# Patient Record
Sex: Female | Born: 1955 | Race: White | Hispanic: No | Marital: Married | Smoking: Never smoker
Health system: Southern US, Community
[De-identification: ages and names within clinical notes are randomized; demographics above are authoritative.]

## PROBLEM LIST (undated history)

## (undated) DIAGNOSIS — K589 Irritable bowel syndrome without diarrhea: Secondary | ICD-10-CM

## (undated) DIAGNOSIS — M329 Systemic lupus erythematosus, unspecified: Secondary | ICD-10-CM

## (undated) DIAGNOSIS — A4902 Methicillin resistant Staphylococcus aureus infection, unspecified site: Secondary | ICD-10-CM

## (undated) DIAGNOSIS — M1990 Unspecified osteoarthritis, unspecified site: Secondary | ICD-10-CM

## (undated) DIAGNOSIS — R7989 Other specified abnormal findings of blood chemistry: Secondary | ICD-10-CM

## (undated) DIAGNOSIS — K900 Celiac disease: Secondary | ICD-10-CM

## (undated) DIAGNOSIS — R32 Unspecified urinary incontinence: Secondary | ICD-10-CM

## (undated) DIAGNOSIS — I493 Ventricular premature depolarization: Secondary | ICD-10-CM

## (undated) DIAGNOSIS — I4891 Unspecified atrial fibrillation: Secondary | ICD-10-CM

## (undated) DIAGNOSIS — I272 Pulmonary hypertension, unspecified: Secondary | ICD-10-CM

## (undated) DIAGNOSIS — E079 Disorder of thyroid, unspecified: Secondary | ICD-10-CM

## (undated) DIAGNOSIS — D649 Anemia, unspecified: Secondary | ICD-10-CM

## (undated) DIAGNOSIS — R011 Cardiac murmur, unspecified: Secondary | ICD-10-CM

## (undated) DIAGNOSIS — H25013 Cortical age-related cataract, bilateral: Secondary | ICD-10-CM

## (undated) DIAGNOSIS — G459 Transient cerebral ischemic attack, unspecified: Secondary | ICD-10-CM

## (undated) DIAGNOSIS — I499 Cardiac arrhythmia, unspecified: Secondary | ICD-10-CM

## (undated) DIAGNOSIS — G4730 Sleep apnea, unspecified: Secondary | ICD-10-CM

## (undated) DIAGNOSIS — F419 Anxiety disorder, unspecified: Secondary | ICD-10-CM

## (undated) DIAGNOSIS — K219 Gastro-esophageal reflux disease without esophagitis: Secondary | ICD-10-CM

## (undated) DIAGNOSIS — K760 Fatty (change of) liver, not elsewhere classified: Secondary | ICD-10-CM

## (undated) DIAGNOSIS — I5030 Unspecified diastolic (congestive) heart failure: Secondary | ICD-10-CM

## (undated) DIAGNOSIS — G909 Disorder of the autonomic nervous system, unspecified: Secondary | ICD-10-CM

## (undated) DIAGNOSIS — H409 Unspecified glaucoma: Secondary | ICD-10-CM

## (undated) DIAGNOSIS — G4733 Obstructive sleep apnea (adult) (pediatric): Secondary | ICD-10-CM

## (undated) DIAGNOSIS — E039 Hypothyroidism, unspecified: Secondary | ICD-10-CM

## (undated) DIAGNOSIS — N946 Dysmenorrhea, unspecified: Secondary | ICD-10-CM

## (undated) HISTORY — DX: Disorder of thyroid, unspecified: E07.9

## (undated) HISTORY — DX: Unspecified diastolic (congestive) heart failure: I50.30

## (undated) HISTORY — DX: Obstructive sleep apnea (adult) (pediatric): G47.33

## (undated) HISTORY — DX: Anemia, unspecified: D64.9

## (undated) HISTORY — DX: Ventricular premature depolarization: I49.3

## (undated) HISTORY — DX: Disorder of the autonomic nervous system, unspecified: G90.9

## (undated) HISTORY — DX: Unspecified glaucoma: H40.9

## (undated) HISTORY — DX: Irritable bowel syndrome, unspecified: K58.9

## (undated) HISTORY — DX: Methicillin resistant Staphylococcus aureus infection, unspecified site: A49.02

## (undated) HISTORY — DX: Cortical age-related cataract, bilateral: H25.013

## (undated) HISTORY — DX: Celiac disease: K90.0

## (undated) HISTORY — DX: Unspecified urinary incontinence: R32

## (undated) HISTORY — DX: Sleep apnea, unspecified: G47.30

## (undated) HISTORY — PX: CHOLECYSTECTOMY: SHX55

## (undated) HISTORY — DX: Systemic lupus erythematosus, unspecified: M32.9

## (undated) HISTORY — DX: Fatty (change of) liver, not elsewhere classified: K76.0

## (undated) HISTORY — DX: Pulmonary hypertension, unspecified: I27.20

## (undated) HISTORY — DX: Other specified abnormal findings of blood chemistry: R79.89

## (undated) HISTORY — DX: Cardiac murmur, unspecified: R01.1

## (undated) HISTORY — DX: Anxiety disorder, unspecified: F41.9

## (undated) HISTORY — PX: TUBAL LIGATION: SHX77

## (undated) HISTORY — DX: Transient cerebral ischemic attack, unspecified: G45.9

## (undated) HISTORY — DX: Dysmenorrhea, unspecified: N94.6

## (undated) HISTORY — DX: Cardiac arrhythmia, unspecified: I49.9

---
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
## 1984-12-26 HISTORY — PX: LAPAROSCOPIC TUBAL LIGATION: SUR803

## 2014-05-26 HISTORY — PX: OTHER SURGICAL HISTORY: SHX169

## 2014-11-24 ENCOUNTER — Encounter: Payer: Self-pay | Admitting: Obstetrics and Gynecology

## 2014-11-24 ENCOUNTER — Ambulatory Visit (INDEPENDENT_AMBULATORY_CARE_PROVIDER_SITE_OTHER): Payer: BC Managed Care – PPO | Admitting: Obstetrics and Gynecology

## 2014-11-24 VITALS — BP 130/78 | HR 64 | Resp 16 | Ht 66.0 in | Wt 229.0 lb

## 2014-11-24 DIAGNOSIS — N921 Excessive and frequent menstruation with irregular cycle: Secondary | ICD-10-CM

## 2014-11-24 DIAGNOSIS — Z0000 Encounter for general adult medical examination without abnormal findings: Secondary | ICD-10-CM

## 2014-11-24 LAB — POCT URINALYSIS DIPSTICK
Bilirubin, UA: NEGATIVE
GLUCOSE UA: NEGATIVE
KETONES UA: NEGATIVE
Leukocytes, UA: NEGATIVE
Nitrite, UA: NEGATIVE
Protein, UA: NEGATIVE
UROBILINOGEN UA: NEGATIVE
pH, UA: 5

## 2014-11-24 NOTE — Progress Notes (Signed)
Patient ID: Maria Meza, female   DOB: 1956-12-13, 58 y.o.   MRN: [id number]   PCP:  Brock Ra, MD  58 y.o. [phone] MarriedCaucasianF here for problem visit. LMP 11/18/14.  Mirena IUD placed in 03/21/11.  Wants to know what to expect from menopause.   Having some shortness of breath just prior or just after menses, occuring for the last 2 - 3 years. Feels breathless.  Saw an endocrinologist and had no hormonal imbalance.  No hot flashes or night sweats.  Some left sided pain recently.  No change in bowel function.  Has IBS.   Was diagnosed with lupus in the mean time.  Saw neurology, pulmonary, and cardiology specialists.  Used to have difficult menses.   Menses have never stopped.  Has skipped 2 months at a time.  Had had heavy menses, and blood flow improved but never stopped.  Cramping controlled also.  Had an ultrasound 5 years ago at [facility] and it was normal.  Believes she an endometrial biopsy by Dr. Alton Revere in [place] 5 years ago, and this was normal also.   Used OCPs in past and this was discontinued due to TIAs.   Has a cardiac arrhythmia and takes Metroprolol.  Sees Dr. Vennie Homans cardiologist at [facility] for this.   Has well controlled hypothyroidism.   Patient's last menstrual period was 11/23/2014 (exact date).          Sexually active: Yes.  female  The current method of family planning is IUD--Mirena inserted 02/2011.   Exercising: No.  none. Smoker:  no  Health Maintenance: Pap:  06-25-14 DGL:OVFIEP of HPV testing History of abnormal Pap:  no MMG:  10-03-14 [id number]  [facility] in [place], [place].  Next colonoscopy due 03/2015. BMD:   never TDaP:  2012 Screening Labs:   Hb today: PCP, Urine today: 1+ RBC's--see LMP   reports that she has never smoked. She does not have any smokeless tobacco history on file. She reports that she does not drink alcohol or use illicit drugs.  Past Medical History  Diagnosis Date   . Anemia   . Anxiety   . Dysmenorrhea   . Heart murmur   . Thyroid disease     hypothyroidism  . Urinary incontinence     with sneezing, coughing  . Lupus   . Abnormal heart rate   . TIA (transient ischemic attack)     2 per patient    Past Surgical History  Procedure Laterality Date  . Tubal ligation    . Cholecystectomy      No current outpatient prescriptions on file.   No current facility-administered medications for this visit.    Family History  Problem Relation Age of Onset  . Heart attack Father     dec age 10  . Hypertension Father   . Rheum arthritis Maternal Grandmother   . Migraines Maternal Grandmother   . Cancer Maternal Grandfather 52    colon ca--DEC age 56  . Diabetes Maternal Grandfather   . Hypertension Mother   . Hyperlipidemia Mother   . Migraines Mother   . Seizures Mother     epilepsy  . Stroke Mother     TIA's  . Stroke Paternal Grandmother     multiple  . Thyroid disease Paternal Grandmother     goiter-hypothyroid  . Thyroid disease Sister     hypothyroid  . Thyroid disease Sister     hypothyroid    ROS:  Pertinent items are  noted in HPI.  Otherwise, a comprehensive ROS was negative.  Exam:       Height: 5' 6"  (167.6 cm)  Ht Readings from Last 3 Encounters:  11/24/14 5' 6"  (1.676 m)    General appearance: alert, cooperative and appears stated age Head: Normocephalic, without obvious abnormality, atraumatic Neck: no adenopathy, supple, symmetrical, trachea midline and thyroid normal to inspection and palpation Lungs: clear to auscultation bilaterally   Heart: regular rate and rhythm Abdomen: central obesity, soft, non-tender; bowel sounds normal; no masses,  no organomegaly Extremities: extremities normal, atraumatic, no cyanosis or edema Skin: Skin color, texture, turgor normal. No rashes or lesions Lymph nodes: Cervical, supraclavicular, and axillary nodes normal. No abnormal inguinal nodes palpated Neurologic: Grossly  normal  Pelvic: External genitalia:  no lesions              Urethra:  normal appearing urethra with no masses, tenderness or lesions              Bartholins and Skenes: normal                 Vagina: normal appearing vagina with normal color and discharge, no lesions              Cervix: no lesions and Menstrual mucousy flow noted and IUD strings present.               Pap taken: No. Bimanual Exam:  Uterus:  normal size, contour, position, consistency, mobility, non-tender              Adnexa: normal adnexa and no mass, fullness, tenderness               Rectovaginal: Confirms               Anus:  normal sphincter tone, no lesions  Assessment   Mirena IUD patient.  History of menorrhagia.  Perimenstrual shortness of breath.  Hypothyroidism.  History of TIAs.  Lupus.   Plan    Will check FSH and AMH. Return for pelvic ultrasound and potential endometrial biopsy to rule out pathology.   An After Visit Summary was printed and given to the patient.

## 2014-11-25 LAB — FOLLICLE STIMULATING HORMONE: FSH: 18 m[IU]/mL

## 2014-11-25 LAB — ESTRADIOL: Estradiol: 14.9 pg/mL

## 2014-11-26 ENCOUNTER — Telehealth: Payer: Self-pay | Admitting: Obstetrics and Gynecology

## 2014-11-26 NOTE — Telephone Encounter (Signed)
Patient returned call. Advised that per benefit quote received, she will not have any financial responsibility when she comes in for PUS. Patient agreeable. Scheduled PUS. Advised patient of 72 hour cancellation policy and $355 cancellation fee. Patient agreeable.

## 2014-11-26 NOTE — Telephone Encounter (Signed)
Left message for patient to call back. Need to go over benefits for and schedule PUS/EMB. Pr $0

## 2014-11-27 LAB — ANTI MULLERIAN HORMONE

## 2014-12-11 ENCOUNTER — Encounter: Payer: Self-pay | Admitting: Obstetrics and Gynecology

## 2014-12-11 ENCOUNTER — Ambulatory Visit (INDEPENDENT_AMBULATORY_CARE_PROVIDER_SITE_OTHER): Payer: BC Managed Care – PPO | Admitting: Obstetrics and Gynecology

## 2014-12-11 ENCOUNTER — Ambulatory Visit (INDEPENDENT_AMBULATORY_CARE_PROVIDER_SITE_OTHER): Payer: BC Managed Care – PPO

## 2014-12-11 DIAGNOSIS — N921 Excessive and frequent menstruation with irregular cycle: Secondary | ICD-10-CM

## 2014-12-11 NOTE — Progress Notes (Signed)
Subjective   Patient is here today for pelvic ultrasound and potential endometrial biopsy.  Has a Mirena IUD in place.  Can skip 2 months at a time.  When does have menses, it is less bleeding.  Changes a pad every 2 - 3 hours but pad is not saturated.  No clotting.  It was exceedingly heavy prior to Mirena IUD.  Last EMB was 5 years ago.  FSH 18, E2 14.9, and AMH < 0.03 on 11/24/14.  Objective  Pelvic ultrasound performed - Images and report reviewed with patient.   Uterus with IUD in canal.  EMS 9.31 mm.  Ovaries normal. No free fluid.     Endometrial biopsy  Consent for procedure. Speculum placed.  Prep of cervix with Hibiclens.  IUD string noted.  Pipelle placed once to 7 cm and tissue obtained and sent to pathology.   Assessment  Menorrhagia with irregular menses.  Mirena IUD patient.  [place] premenopausal.   Plan  Follow up EMB. Instructions given.  OK to continue with Mirena IUD.   15 minutes face to face time of which over 50% was spent in counseling.   After visit summary to patient.

## 2014-12-11 NOTE — Patient Instructions (Signed)
Endometrial Biopsy, Care After Refer to this sheet in the next few weeks. These instructions provide you with information on caring for yourself after your procedure. Your health care provider may also give you more specific instructions. Your treatment has been planned according to current medical practices, but problems sometimes occur. Call your health care provider if you have any problems or questions after your procedure. WHAT TO EXPECT AFTER THE PROCEDURE After your procedure, it is typical to have the following:  You may have mild cramping and a small amount of vaginal bleeding for a few days after the procedure. This is normal. HOME CARE INSTRUCTIONS  Only take over-the-counter or prescription medicine as directed by your health care provider.  Do not douche, use tampons, or have sexual intercourse until your health care provider approves.  Follow your health care provider's instructions regarding any activity restrictions, such as strenuous exercise or heavy lifting. SEEK MEDICAL CARE IF:  You have heavy bleeding or bleeding longer than 2 days after the procedure.  You have bad smelling drainage from your vagina.  You have a fever and chills.  Youhave severe lower stomach (abdominal) pain. SEEK IMMEDIATE MEDICAL CARE IF:  You have severe cramps in your stomach or back.  You pass large blood clots.  Your bleeding increases.  You become weak or lightheaded, or you pass out. Document Released: 10/02/2013 Document Reviewed: 10/02/2013 ExitCare Patient Information 2015 [employer]. This information is not intended to replace advice given to you by your health care provider. Make sure you discuss any questions you have with your health care provider.  

## 2014-12-15 LAB — IPS OTHER TISSUE BIOPSY

## 2014-12-16 ENCOUNTER — Telehealth: Payer: Self-pay

## 2014-12-16 NOTE — Telephone Encounter (Signed)
Spoke with patient. Advised patient of message and results as seen below from Maria Meza. Patient is agreeable and verbalizes understanding.   Routing to provider for final review. Patient agreeable to disposition. Will close encounter

## 2014-12-16 NOTE — Telephone Encounter (Signed)
-----   Message from [place], MD sent at 12/16/2014 11:30 AM EST ----- Please let patient know that her endometrial biopsy was negative and normal! No further evaluation is needed at this time.

## 2015-03-23 ENCOUNTER — Encounter: Payer: Self-pay | Admitting: Obstetrics and Gynecology

## 2015-03-23 ENCOUNTER — Ambulatory Visit (INDEPENDENT_AMBULATORY_CARE_PROVIDER_SITE_OTHER): Payer: BLUE CROSS/BLUE SHIELD | Admitting: Obstetrics and Gynecology

## 2015-03-23 VITALS — BP 120/70 | HR 70 | Resp 14 | Ht 65.5 in | Wt 209.8 lb

## 2015-03-23 DIAGNOSIS — N951 Menopausal and female climacteric states: Secondary | ICD-10-CM | POA: Diagnosis not present

## 2015-03-23 DIAGNOSIS — Z0000 Encounter for general adult medical examination without abnormal findings: Secondary | ICD-10-CM

## 2015-03-23 DIAGNOSIS — Z01419 Encounter for gynecological examination (general) (routine) without abnormal findings: Secondary | ICD-10-CM

## 2015-03-23 LAB — POCT URINALYSIS DIPSTICK
Leukocytes, UA: NEGATIVE
UROBILINOGEN UA: NEGATIVE
pH, UA: 5

## 2015-03-23 NOTE — Patient Instructions (Signed)

## 2015-03-23 NOTE — Progress Notes (Signed)
59 y.o. [id number] MarriedCaucasianF here for annual exam.   Mirena IUD placed 02/2011. No menses Jan or Feb 2016.  LMP was March 17, 2015.  Still spotting today.  No heavy menses or clotting.  Has had menses almost every month for the last year.  Menses are definitely better since the IUD was placed.  Infrequent hot flashes.   Patient is having anemia and palpitations.  Some shortness of breath and dizziness. PCP is Dr. Lonia Mad in [place].  Having ferritin infusions. Has also seen cardiologist. Did Holter monitor.   Has Lupus and hypothyroidism.  TFTs normal per patient.  History of TIA.  Had a UTI in January 2016 when in hospital for palpitations.  No symptoms and no test of cure to date.   Some leak of urine if cough or sneeze.  Does Kegels on toilet.   FSH 18, E2 14.9, and AMH < 0.03 on 11/24/14. Normal pelvic ultrasound last year.   No LMP recorded.          Sexually active: Yes.    The current method of family planning is IUD.    Exercising: No.  The patient does not participate in regular exercise at present. Smoker:  no  Health Maintenance: Pap:  06/2014 Normal- per pt History of abnormal Pap:  no MMG:  09/2014 [facility] Colonoscopy:  03/2005 Normal - due 03/2015.  Did in [place].  BMD:   Never  TDaP:  2012  Screening Labs: PCP, Hb today: PCP, Urine today: Trace RBC;s    reports that she has never smoked. She does not have any smokeless tobacco history on file. She reports that she does not drink alcohol or use illicit drugs.  Past Medical History  Diagnosis Date  . Anemia   . Anxiety   . Dysmenorrhea   . Heart murmur   . Thyroid disease     hypothyroidism  . Urinary incontinence     with sneezing, coughing  . Lupus   . TIA (transient ischemic attack)     2 per patient  . Arrhythmia     Past Surgical History  Procedure Laterality Date  . Tubal ligation    . Cholecystectomy      Current Outpatient Prescriptions   Medication Sig Dispense Refill  . albuterol (PROAIR HFA) 108 (90 BASE) MCG/ACT inhaler Inhale 1 puff into the lungs as needed.    Marland Kitchen aspirin EC 81 MG tablet Take 81 mg by mouth daily.    Marland Kitchen b complex vitamins capsule Take 1 capsule by mouth 2 (two) times daily.    . folic acid (FOLVITE) 1 MG tablet Take 1 mg by mouth. 2 tablets daily    . hydroxychloroquine (PLAQUENIL) 200 MG tablet Take 200 mg by mouth daily.    . magnesium oxide (MAG-OX) 400 MG tablet Take 1 tablet by mouth 2 (two) times daily.    . metoprolol (LOPRESSOR) 50 MG tablet Take 50 mg by mouth daily.    . Vitamin D, Ergocalciferol, (DRISDOL) 50000 UNITS CAPS capsule Take 50,000 Units by mouth once a week.     No current facility-administered medications for this visit.    Family History  Problem Relation Age of Onset  . Heart attack Father     dec age 66  . Hypertension Father   . Rheum arthritis Maternal Grandmother   . Migraines Maternal Grandmother   . Cancer Maternal Grandfather 45    colon ca--DEC age 78  . Diabetes Maternal Grandfather   .  Hypertension Mother   . Hyperlipidemia Mother   . Migraines Mother   . Seizures Mother     epilepsy  . Stroke Mother     TIA's  . Stroke Paternal Grandmother     multiple  . Thyroid disease Paternal Grandmother     goiter-hypothyroid  . Thyroid disease Sister     hypothyroid  . Thyroid disease Sister     hypothyroid    ROS:  Pertinent items are noted in HPI.  Otherwise, a comprehensive ROS was negative.  Exam:   There were no vitals taken for this visit.         Ht Readings from Last 3 Encounters:  12/11/14 5' 6"  (1.676 m)  11/24/14 5' 6"  (1.676 m)    General appearance: alert, cooperative and appears stated age Head: Normocephalic, without obvious abnormality, atraumatic Neck: no adenopathy, supple, symmetrical, trachea midline and thyroid normal to inspection and palpation Lungs: clear to auscultation bilaterally Breasts: normal appearance, no masses or  tenderness, Inspection negative, No nipple discharge or bleeding, No axillary or supraclavicular adenopathy, Right nipple normal.  Left nipple inverted (old change per patient.) Heart: regular rate and rhythm Abdomen: soft, non-tender; bowel sounds normal; no masses,  no organomegaly Extremities: extremities normal, atraumatic, no cyanosis or edema Skin: Skin color, texture, turgor normal. No rashes or lesions Lymph nodes: Cervical, supraclavicular, and axillary nodes normal. No abnormal inguinal nodes palpated Neurologic: Grossly normal   Pelvic: External genitalia:  no lesions              Urethra:  normal appearing urethra with no masses, tenderness or lesions              Bartholins and Skenes: normal                 Vagina: normal appearing vagina with normal color and discharge, no lesions              Cervix: no lesions and IUD strings seen.  Bloody mucous noted.              Pap taken: No. Bimanual Exam:  Uterus:  normal size, contour, position, consistency, mobility, non-tender              Adnexa: normal adnexa and no mass, fullness, tenderness               Rectovaginal: Confirms               Anus:  normal sphincter tone, no lesions  Chaperone was present for exam.  A:  Well Woman with normal exam Mirena IUD patient.  Anemia of undetermined etiology.  Palpitations under work up with cardiologist.  Mild stress incontinence.   P:   Mammogram yearly.  pap smear not indicated.  Check FSH.  Colonoscopy recommended.  She will do this in [place].  Kegels reviewed.  Briefly mentioned PT and surgery if GSI persists or worsens.   return annually or prn

## 2015-03-24 LAB — URINALYSIS, MICROSCOPIC ONLY
Bacteria, UA: NONE SEEN
CASTS: NONE SEEN
Crystals: NONE SEEN
Squamous Epithelial / LPF: NONE SEEN

## 2015-03-24 LAB — FOLLICLE STIMULATING HORMONE: FSH: 28.1 m[IU]/mL

## 2015-03-25 LAB — URINE CULTURE
COLONY COUNT: NO GROWTH
ORGANISM ID, BACTERIA: NO GROWTH

## 2015-11-17 ENCOUNTER — Telehealth: Payer: Self-pay | Admitting: Emergency Medicine

## 2015-11-17 ENCOUNTER — Encounter: Payer: Self-pay | Admitting: Obstetrics and Gynecology

## 2015-11-17 DIAGNOSIS — Z30432 Encounter for removal of intrauterine contraceptive device: Secondary | ICD-10-CM

## 2015-11-17 NOTE — Telephone Encounter (Signed)
Ok to remove the IUD really at any time.  If I have approval for removal at the time of her annual exam, I am happy to do so then.

## 2015-11-17 NOTE — Telephone Encounter (Signed)
Dr. Quincy Simmonds,   Patient sent mychart message with request for information about IUD removal.  [place] on 03/23/15 was 28.1.   Okay to schedule appointment for IUD removal in February/Early March?

## 2015-11-17 NOTE — Telephone Encounter (Signed)
Chief Complaint  Patient presents with  . Advice Only    Mychart message     ===View-only below this line===   ----- Message -----    FromRushie Nyhan    Sent: 11/17/2015  9:39 AM EST      To: Arloa Koh, MD Subject: Non-Urgent Medical Question  I have to have my IUD removed.  It will be 5 years on March 26th, 2017.  I have my upcoming physical on April 5th, 2017 at 11:00 with Dr. Quincy Simmonds.  Will she be able to remove it on that date, or do I need to make another appointment to have that done?  Are there any side effects in removing an IUD.  I have a Morena.  Thank you, Neoma Laming

## 2015-11-23 NOTE — Telephone Encounter (Signed)
Spoke with patient. She is agreeable to IUD removal at time of annual exam. Order placed. Patient does not think she will have any insurance changes.   Patient states she has had a cycle on 04/17/15 and then 10/04/15. Advised will let Dr. Quincy Simmonds know and return call if any changes to plan. Patient agreeable.

## 2015-11-23 NOTE — Telephone Encounter (Signed)
Please let me know if she has any further menstrual cycles.  If she does, I would like to have her return for a recheck.

## 2015-11-24 NOTE — Telephone Encounter (Signed)
Call to patient and she is given message from Dr. Quincy Simmonds. She verbalizes understanding of recommendations and instructions and will call with any future bleeding. Routing to provider for final review. Patient agreeable to disposition. Will close encounter.

## 2015-12-27 DIAGNOSIS — M359 Systemic involvement of connective tissue, unspecified: Secondary | ICD-10-CM

## 2015-12-27 HISTORY — DX: Systemic involvement of connective tissue, unspecified: M35.9

## 2015-12-29 ENCOUNTER — Encounter: Payer: Self-pay | Admitting: Obstetrics and Gynecology

## 2015-12-29 ENCOUNTER — Telehealth: Payer: Self-pay | Admitting: Emergency Medicine

## 2015-12-29 DIAGNOSIS — N938 Other specified abnormal uterine and vaginal bleeding: Secondary | ICD-10-CM

## 2015-12-29 NOTE — Telephone Encounter (Signed)
Patient will need blood work done and a potential IUD removal, sonohysterogram and endometrial biopsy.  I see she has an appointment on a Wednesday.  Is it possible to have her come to the office on Thursday next week instead? If not, we will start with her appointment on Wednesday and schedule a secondary appointment if needed.

## 2015-12-29 NOTE — Telephone Encounter (Signed)
Call to patient for clinical triage.  Office visit scheduled per patient request for 01/06/16 with Dr. Quincy Simmonds.  Will close mychart encounter.

## 2015-12-29 NOTE — Telephone Encounter (Signed)
Call to patient. She confirms message below. States she has been having vaginal bleeding, more than spotting since 12/23/15. She reports that she had cycles 04/17/15 and 10/17/15.  She is advised that after last episode of bleeding, message from Dr. Quincy Simmonds was to have patient follow up in office for evaluation. Patient is agreeable to this. She requests to be seen next week since she lives in [place] and has upcoming cardiology appointments.  Advised patient will request Dr. Quincy Simmonds review and may plan for procedure and if so will return her call to give instructions.  Patient agreeable. She is advised to call back with any heavy bleeding or concerning symptoms.  I advised patient to keep calendar of menses, call back if bleeding continues or is soaking pads or if any abdominal pain or cramping. She verbalizes understanding of symptoms of bleeding emergencies and when to call back to office or go to urgent care or emergency room as needed. Patient is agreeable and verbalizes understanding of plan and will call back if condition changes or worsens. Routing to Dr. Quincy Simmonds for review and orders if necessary.

## 2015-12-29 NOTE — Telephone Encounter (Signed)
Chief Complaint  Patient presents with  . Advice Only    Patient sent mychart message.     ===View-only below this line===   ----- Message -----    FromRushie Nyhan    Sent: 12/29/2015  1:22 PM EST      To: Arloa Koh, MD Subject: Non-Urgent Medical Question  Hi Dr. Quincy Simmonds, I had sent a message a month or so ago that I had not had a period since April 17, 2015, but had one on October 22nd.  You had advised if this happened again to let you know.  I have started my cycle again on December 28th and am still on it.  I don't have a lot of bleeding but enough to wear a pad.  I know I am scheduled in April to have my Morena removed and I just want to make sure that everything is ok.  Just let me know what you advise.  Thank you and Happy New Year to you and your staff  Maria Meza

## 2015-12-30 NOTE — Telephone Encounter (Signed)
Patient returned call and she is given message from Dr. Quincy Simmonds.  She requests to keep appointment with Dr. Quincy Simmonds as scheduled and will return for follow up as necessary.  Update to Dr. Quincy Simmonds.

## 2015-12-30 NOTE — Telephone Encounter (Signed)
Message left to return call to Arbor Leer at [phone].    

## 2016-01-06 ENCOUNTER — Ambulatory Visit (INDEPENDENT_AMBULATORY_CARE_PROVIDER_SITE_OTHER): Payer: BLUE CROSS/BLUE SHIELD | Admitting: Obstetrics and Gynecology

## 2016-01-06 ENCOUNTER — Encounter: Payer: Self-pay | Admitting: Obstetrics and Gynecology

## 2016-01-06 VITALS — BP 110/64 | HR 60 | Ht 65.5 in | Wt 223.0 lb

## 2016-01-06 DIAGNOSIS — N926 Irregular menstruation, unspecified: Secondary | ICD-10-CM

## 2016-01-06 NOTE — Progress Notes (Signed)
Patient ID: Maria Meza, female   DOB: [birth year], 60 y.o.   MRN: [id number] GYNECOLOGY  VISIT   HPI: 60 y.o.   Married  Caucasian  female   G2P2002 with Patient's last menstrual period was 12/23/2015 (approximate).   here for vaginal bleeding. Patient states had a cycle 12/2014, 03/2015, 09/2015 and began a cycle 12-23-15 and is still have some slight bleeding with lower back discomfort.  Feeling crampy.  No clotting.   [place] 28.1 on 03/23/15.  FSH 18, E2 14.9, and AMH < 0.03 on 11/24/14.  Pelvic ultrasound performed 12/11/14: Uterus with IUD in canal.  EMS 9.31 mm.  Ovaries normal. No free fluid.   Had a benign EMB the same day.   Mirena IUD due for removal in March 2017. It was placed 5 years ago to control heavy menses.  GYNECOLOGIC HISTORY: Patient's last menstrual period was 12/23/2015 (approximate). Contraception:Mirena IUD inserted 02/2011 Menopausal hormone therapy: none Last mammogram: 09/2015 normal per patient in [place], [place] Last pap smear: 06/2014 normal per patient        OB History    Gravida Para Term Preterm AB TAB SAB Ectopic Multiple Living   2 2 2       2          There are no active problems to display for this patient.   Past Medical History  Diagnosis Date  . Anemia   . Anxiety   . Dysmenorrhea   . Heart murmur   . Thyroid disease     hypothyroidism  . Urinary incontinence     with sneezing, coughing  . Lupus ([place])   . TIA (transient ischemic attack)     2 per patient  . Arrhythmia   . Sleep apnea     Past Surgical History  Procedure Laterality Date  . Tubal ligation    . Cholecystectomy      Current Outpatient Prescriptions  Medication Sig Dispense Refill  . albuterol (PROAIR HFA) 108 (90 BASE) MCG/ACT inhaler Inhale 1 puff into the lungs as needed.    Marland Kitchen b complex vitamins capsule Take 1 capsule by mouth 2 (two) times daily.    . folic acid (FOLVITE) 1 MG tablet Take 1 mg by mouth. 2 tablets daily    . hydroxychloroquine  (PLAQUENIL) 200 MG tablet Take 200 mg by mouth daily.    . Iron-Vitamin C 65-125 MG TABS Take by mouth.    . latanoprost (XALATAN) 0.005 % ophthalmic solution Apply 1 drop to eye daily.    Marland Kitchen levothyroxine (SYNTHROID, LEVOTHROID) 137 MCG tablet Take 137 mcg by mouth daily before breakfast.    . magnesium oxide (MAG-OX) 400 MG tablet Take 1 tablet by mouth daily.     . metoprolol tartrate (LOPRESSOR) 25 MG tablet Take 25 mg by mouth 2 (two) times daily.    Marland Kitchen UNABLE TO FIND Compound Iron Infusion Every quarter    . Vitamin D, Ergocalciferol, (DRISDOL) 50000 UNITS CAPS capsule Take 50,000 Units by mouth once a week.     No current facility-administered medications for this visit.     ALLERGIES: Hydrocodone and Pacerone  Family History  Problem Relation Age of Onset  . Heart attack Father     dec age 7  . Hypertension Father   . Rheum arthritis Maternal Grandmother   . Migraines Maternal Grandmother   . Cancer Maternal Grandfather 10    colon ca--DEC age 29  . Diabetes Maternal Grandfather   . Hypertension Mother   .  Hyperlipidemia Mother   . Migraines Mother   . Seizures Mother     epilepsy  . Stroke Mother     TIA's  . Stroke Paternal Grandmother     multiple  . Thyroid disease Paternal Grandmother     goiter-hypothyroid  . Thyroid disease Sister     hypothyroid  . Thyroid disease Sister     hypothyroid    Social History   Social History  . Marital Status: Married    Spouse Name: N/A  . Number of Children: N/A  . Years of Education: N/A   Occupational History  . Not on file.   Social History Main Topics  . Smoking status: Never Smoker   . Smokeless tobacco: Not on file  . Alcohol Use: No  . Drug Use: No  . Sexual Activity:    Partners: Male    Birth Control/ Protection: IUD     Comment: Mirena inserted 02/2011   Other Topics Concern  . Not on file   Social History Narrative    ROS:  Pertinent items are noted in HPI.  PHYSICAL EXAMINATION:    BP  110/64 mmHg  Pulse 60  Ht 5' 5.5" (1.664 m)  Wt 223 lb (101.152 kg)  BMI 36.53 kg/m2  LMP 12/23/2015 (Approximate)    General appearance: alert, cooperative and appears stated age  Pelvic: External genitalia:  no lesions              Urethra:  normal appearing urethra with no masses, tenderness or lesions              Bartholins and Skenes: normal                 Vagina: normal appearing vagina with normal color and discharge, no lesions              Cervix: no lesions.  IUD strings seen.  Tan tinged mucous at external os.        Bimanual Exam:  Uterus:  normal size, contour, position, consistency, mobility, non-tender              Adnexa: normal adnexa and no mass, fullness, tenderness               Chaperone was present for exam.  ASSESSMENT  Irregular menses. Perimenopausal female.  Mirena IUD patient.  Almost due for removal.   PLAN  Counseled regarding perimenopausal menses versus postmenopausal bleeding.  Will check FSH and estradiol today.  If labs indicate menopause, will proceed with removal of the IUD and evaluation of bleeding with sonohysterogram and EMB. We did review Mirena IUD and its ability to reduce risk of endometrial hyperplasia and malignancy in addition to treating heavy menses and providing contraception.  Patient assured that her risk of pregnancy is not of a concern at this time.    An After Visit Summary was printed and given to the patient.  __15____ minutes face to face time of which over 50% was spent in counseling.

## 2016-01-07 ENCOUNTER — Other Ambulatory Visit: Payer: Self-pay | Admitting: Obstetrics and Gynecology

## 2016-01-07 DIAGNOSIS — Z30432 Encounter for removal of intrauterine contraceptive device: Secondary | ICD-10-CM

## 2016-01-07 LAB — FOLLICLE STIMULATING HORMONE: FSH: 34.4 m[IU]/mL

## 2016-01-07 LAB — ESTRADIOL: ESTRADIOL: 15.2 pg/mL

## 2016-01-08 ENCOUNTER — Encounter: Payer: Self-pay | Admitting: Obstetrics and Gynecology

## 2016-01-08 ENCOUNTER — Telehealth: Payer: Self-pay

## 2016-01-08 NOTE — Telephone Encounter (Signed)
Non-Urgent Medical Question  Message [phone]   From  Tavionna Grout   To  Nunzio Cobbs, MD   Sent  01/08/2016 10:19 AM     Good Morning,   I have an upcoming appointment for a physical on April 5th. I was in the office on Jan 11th for an evaluation. Dr. Quincy Simmonds said that we would remove the [facility] IUD in March, (I think it is around March 26th that the five year period will expire) Could you possibly check with her to see if she will do the removal on the April 5th appt date or would I need to come in March since the April appt is for my physical?   Thank you, I appreciate your help.   [name]      Responsible Party    Pool - [facility] Clinical Pool No one has taken responsibility for this message.     No actions have been taken on this message.     Routing to [place] for review and advise. Okay to remove patient's IUD at her aex appointment on 03/30/2016?

## 2016-01-08 NOTE — Telephone Encounter (Signed)
Spoke with patient. Advised of message as seen below from [place]. Patient is agreeable.  Routing to provider for final review. Patient agreeable to disposition. Will close encounter.

## 2016-01-08 NOTE — Telephone Encounter (Signed)
Telephone encounter created to discuss mychart message with Dr.Silva. 

## 2016-01-08 NOTE — Telephone Encounter (Signed)
It is Ok to remove the Mirena at her annual exam appointment in April.  I sent this through to precert already.   Thanks.

## 2016-01-12 ENCOUNTER — Telehealth: Payer: Self-pay | Admitting: Obstetrics and Gynecology

## 2016-01-12 NOTE — Telephone Encounter (Signed)
Called patient to discuss benefits for a procedure. Left Voicemail requesting a call.

## 2016-01-13 NOTE — Telephone Encounter (Signed)
Spoke with pt regarding benefit for IUD removal. Patient understood and agreeable. Patient states have this done on her 03/30/16 appointment with Dr. Quincy Simmonds. Pt aware of arrival date and time. No further questions. Ok to close

## 2016-01-22 ENCOUNTER — Telehealth: Payer: Self-pay | Admitting: Obstetrics and Gynecology

## 2016-01-22 NOTE — Telephone Encounter (Signed)
Spoke with patient. Advised patient we will still be able to remove her IUD at her Aex appointment as previously approved by Dr.Silva (see 01/08/2016 telephone note). I have added this to her aex appointment which is now on 04/06/2016. Patient verbalizes understanding.  Routing to provider for final review. Patient agreeable to disposition. Will close encounter.

## 2016-01-22 NOTE — Telephone Encounter (Signed)
Patient's appointment with Dr Quincy Simmonds for 03/30/16 iud removal has been cancelled because she will not be in the office. Aex was rescheduled but not the IUD removal.

## 2016-03-10 ENCOUNTER — Encounter: Payer: Self-pay | Admitting: Obstetrics and Gynecology

## 2016-03-14 ENCOUNTER — Encounter: Payer: Self-pay | Admitting: Obstetrics and Gynecology

## 2016-03-14 ENCOUNTER — Ambulatory Visit (INDEPENDENT_AMBULATORY_CARE_PROVIDER_SITE_OTHER): Payer: BLUE CROSS/BLUE SHIELD | Admitting: Obstetrics and Gynecology

## 2016-03-14 VITALS — BP 114/70 | HR 64 | Ht 65.5 in | Wt 232.0 lb

## 2016-03-14 DIAGNOSIS — N63 Unspecified lump in breast: Secondary | ICD-10-CM

## 2016-03-14 DIAGNOSIS — N6310 Unspecified lump in the right breast, unspecified quadrant: Secondary | ICD-10-CM

## 2016-03-14 DIAGNOSIS — L739 Follicular disorder, unspecified: Secondary | ICD-10-CM

## 2016-03-14 MED ORDER — CEPHALEXIN 500 MG PO CAPS: 500.0000 mg | ORAL_CAPSULE | Freq: Four times a day (QID) | ORAL | Status: DC

## 2016-03-14 MED ORDER — FLUCONAZOLE 150 MG PO TABS: 150.0000 mg | ORAL_TABLET | Freq: Once | ORAL | Status: DC

## 2016-03-14 NOTE — Progress Notes (Signed)
Scheduled patient while in office for right breast diagnostic mammogram with ultrasound at the [facility] on 03/21/2016 at 1:50 pm. Placed in mammogram hold.

## 2016-03-14 NOTE — Progress Notes (Signed)
Patient ID: Maria Meza, female   DOB: 05-28-1956, 60 y.o.   MRN: [id number] GYNECOLOGY  VISIT   HPI: 60 y.o.   Married  Caucasian  female   G2P2002 with Patient's last menstrual period was 12/27/2015 (approximate).   here for lump in right breast.    First noticed lump one week ago.  No pain of discomfort.   Has hx of boils under her arm.  When she has her lupus flares, these get worse.  Annual exam and IUD removal on April 12.   Maternal aunt with breast cancer.   GYNECOLOGIC HISTORY: Patient's last menstrual period was 12/27/2015 (approximate). Contraception:Mirena 02/2011 Menopausal hormone therapy: none Last mammogram: 09/2015 normal per patient in [place], [place].  Did not her so assumes that no news is good news. Last pap smear: 06/2014 normal per patient        OB History    Gravida Para Term Preterm AB TAB SAB Ectopic Multiple Living   2 2 2       2          There are no active problems to display for this patient.   Past Medical History  Diagnosis Date  . Anemia   . Anxiety   . Dysmenorrhea   . Heart murmur   . Thyroid disease     hypothyroidism  . Urinary incontinence     with sneezing, coughing  . Lupus ([place])   . TIA (transient ischemic attack)     2 per patient  . Arrhythmia   . Sleep apnea     Past Surgical History  Procedure Laterality Date  . Tubal ligation    . Cholecystectomy      Current Outpatient Prescriptions  Medication Sig Dispense Refill  . albuterol (PROAIR HFA) 108 (90 BASE) MCG/ACT inhaler Inhale 1 puff into the lungs as needed.    Marland Kitchen b complex vitamins capsule Take 1 capsule by mouth 2 (two) times daily.    . folic acid (FOLVITE) 1 MG tablet Take 1 mg by mouth. 2 tablets daily    . hydroxychloroquine (PLAQUENIL) 200 MG tablet Take 200 mg by mouth daily.    . Iron-Vitamin C 65-125 MG TABS Take by mouth.    . latanoprost (XALATAN) 0.005 % ophthalmic solution Apply 1 drop to eye daily.    Marland Kitchen levothyroxine (SYNTHROID, LEVOTHROID)  137 MCG tablet Take 137 mcg by mouth daily before breakfast.    . magnesium oxide (MAG-OX) 400 MG tablet Take 1 tablet by mouth daily.     . metoprolol tartrate (LOPRESSOR) 25 MG tablet Take 25 mg by mouth 2 (two) times daily.    . ranitidine (ZANTAC) 150 MG capsule Take 1 capsule by mouth 2 (two) times daily.    Marland Kitchen UNABLE TO FIND Compound Iron Infusion Every quarter    . Vitamin D, Ergocalciferol, (DRISDOL) 50000 UNITS CAPS capsule Take 50,000 Units by mouth once a week.     No current facility-administered medications for this visit.     ALLERGIES: Hydrocodone and Pacerone  Family History  Problem Relation Age of Onset  . Heart attack Father     dec age 61  . Hypertension Father   . Rheum arthritis Maternal Grandmother   . Migraines Maternal Grandmother   . Cancer Maternal Grandfather 85    colon ca--DEC age 2  . Diabetes Maternal Grandfather   . Hypertension Mother   . Hyperlipidemia Mother   . Migraines Mother   . Seizures Mother  epilepsy  . Stroke Mother     TIA's  . Stroke Paternal Grandmother     multiple  . Thyroid disease Paternal Grandmother     goiter-hypothyroid  . Thyroid disease Sister     hypothyroid  . Thyroid disease Sister     hypothyroid    Social History   Social History  . Marital Status: Married    Spouse Name: N/A  . Number of Children: N/A  . Years of Education: N/A   Occupational History  . Not on file.   Social History Main Topics  . Smoking status: Never Smoker   . Smokeless tobacco: Not on file  . Alcohol Use: No  . Drug Use: No  . Sexual Activity:    Partners: Male    Birth Control/ Protection: IUD     Comment: Mirena inserted 02/2011   Other Topics Concern  . Not on file   Social History Narrative    ROS:  Pertinent items are noted in HPI.  PHYSICAL EXAMINATION:    BP 114/70 mmHg  Pulse 64  Ht 5' 5.5" (1.664 m)  Wt 232 lb (105.235 kg)  BMI 38.01 kg/m2  LMP 12/27/2015 (Approximate)    General appearance:  alert, cooperative and appears stated age   Breasts:  Right breast with 1 cm subcutaneous cystic firm mass at 10:00 and slight retraction of the skin.  No nodes, retractions, or nipple discharge. Axillary area with boils and erythema.  Left breast with inverted nipple.  (old change per patient).  No masses, nodes, retractions, or nipple discharge.  Chaperone was present for exam.  ASSESSMENT  Right breast mass.  Right axillary folliculitis.   PLAN  Discussion of breast masses.  Will schedule diagnostic right mammogram and breast ultrasound.  Start Keflex 500 mg po qid for 7 days.  Rx also for Diflucan 150 mg prn yeast infection.  Keep appt for annual and Mirena IUD removal in April.   An After Visit Summary was printed and given to the patient.  _15_____ minutes face to face time of which over 50% was spent in counseling.

## 2016-03-21 ENCOUNTER — Ambulatory Visit
Admission: RE | Admit: 2016-03-21 | Discharge: 2016-03-21 | Disposition: A | Discharge status: Home / Self Care | Payer: BLUE CROSS/BLUE SHIELD | Source: Ambulatory Visit | Attending: Obstetrics and Gynecology | Admitting: Obstetrics and Gynecology

## 2016-03-21 DIAGNOSIS — N6310 Unspecified lump in the right breast, unspecified quadrant: Secondary | ICD-10-CM

## 2016-03-30 ENCOUNTER — Ambulatory Visit: Payer: BLUE CROSS/BLUE SHIELD | Admitting: Obstetrics and Gynecology

## 2016-04-06 ENCOUNTER — Ambulatory Visit (INDEPENDENT_AMBULATORY_CARE_PROVIDER_SITE_OTHER): Payer: BLUE CROSS/BLUE SHIELD | Admitting: Obstetrics and Gynecology

## 2016-04-06 ENCOUNTER — Encounter: Payer: Self-pay | Admitting: Obstetrics and Gynecology

## 2016-04-06 VITALS — BP 122/72 | HR 46 | Resp 14 | Ht 65.0 in | Wt 228.8 lb

## 2016-04-06 DIAGNOSIS — N393 Stress incontinence (female) (male): Secondary | ICD-10-CM

## 2016-04-06 DIAGNOSIS — Z113 Encounter for screening for infections with a predominantly sexual mode of transmission: Secondary | ICD-10-CM

## 2016-04-06 DIAGNOSIS — Z0000 Encounter for general adult medical examination without abnormal findings: Secondary | ICD-10-CM | POA: Diagnosis not present

## 2016-04-06 DIAGNOSIS — Z01419 Encounter for gynecological examination (general) (routine) without abnormal findings: Secondary | ICD-10-CM

## 2016-04-06 DIAGNOSIS — Z30432 Encounter for removal of intrauterine contraceptive device: Secondary | ICD-10-CM

## 2016-04-06 DIAGNOSIS — N951 Menopausal and female climacteric states: Secondary | ICD-10-CM | POA: Diagnosis not present

## 2016-04-06 LAB — POCT URINALYSIS DIPSTICK
Bilirubin, UA: NEGATIVE
Glucose, UA: NEGATIVE
Ketones, UA: NEGATIVE
Leukocytes, UA: NEGATIVE
NITRITE UA: NEGATIVE
PROTEIN UA: NEGATIVE
RBC UA: NEGATIVE
UROBILINOGEN UA: NEGATIVE
pH, UA: 5

## 2016-04-06 LAB — HIV ANTIBODY (ROUTINE TESTING W REFLEX): HIV 1&2 Ab, 4th Generation: NONREACTIVE

## 2016-04-06 LAB — HEPATITIS C ANTIBODY: HCV Ab: NEGATIVE

## 2016-04-06 NOTE — Progress Notes (Signed)
Patient ID: Maria Meza, female   DOB: [birth year], 60 y.o.   MRN: [id number] 60 y.o. G61P2002 Married Caucasian female here for annual exam.    Urinary incontinence with sneeze, cough.  Wearing a pad day and night.   IUD due for removal today.  [place] 28.1 on 03/23/15. FSH 18, E2 14.9, and AMH < 0.03 on 11/24/14. Occasional hot flash and night sweat.   Seen recently for a right breast mass at 10:00 position.  Had diagnostic mammogram and ultrasound - dx of probable fat necrosis.   3 month follow up mammogram scheduled. States lump is gone now.   Broke a dental crown.  Received an Rx for sinusitis from her dentist.  PCP:  Lonia Mad, MD  Patient's last menstrual period was 12/27/2015 (approximate).           Sexually active: No. female The current method of family planning is IUD--Mirena inserted 02/2011(EXPIRED)    Exercising: No.   Smoker:  no  Health Maintenance: Pap:  06/2014 normal per patient History of abnormal Pap:  no MMG:  09/2015 normal per patient in [place]. 03-11-16 Diag.Rt.and US/Density B/probably fat necrosis upper outer in Rt.breast--3 month [place] recommended/BiRads 3/[facility] Colonoscopy:  03/2015 normal in [place];next due 03/2025 BMD:   never  Result  n/a TDaP:  2012 Gardasil:   N/A HIV:  Today.  Hep C:  Today. Screening Labs:  Hb today: PCP, Urine today: Neg   reports that she has never smoked. She does not have any smokeless tobacco history on file. She reports that she does not drink alcohol or use illicit drugs.  Past Medical History  Diagnosis Date  . Anemia   . Anxiety   . Dysmenorrhea   . Heart murmur   . Thyroid disease     hypothyroidism  . Urinary incontinence     with sneezing, coughing  . Lupus ([place])   . TIA (transient ischemic attack)     2 per patient  . Arrhythmia   . Sleep apnea     Past Surgical History  Procedure Laterality Date  . Tubal ligation    . Cholecystectomy      Current Outpatient Prescriptions   Medication Sig Dispense Refill  . albuterol (PROAIR HFA) 108 (90 BASE) MCG/ACT inhaler Inhale 1 puff into the lungs as needed.    Marland Kitchen b complex vitamins capsule Take 1 capsule by mouth 2 (two) times daily.    . cephALEXin (KEFLEX) 500 MG capsule Take 1 capsule (500 mg total) by mouth 4 (four) times daily. Take QID for 7 days. 28 capsule 0  . fluconazole (DIFLUCAN) 150 MG tablet Take 1 tablet (150 mg total) by mouth once. Take one tablet.  Repeat in 48 hours if symptoms are not completely resolved. 2 tablet 0  . folic acid (FOLVITE) 1 MG tablet Take 1 mg by mouth. 2 tablets daily    . hydroxychloroquine (PLAQUENIL) 200 MG tablet Take 200 mg by mouth daily.    . Iron-Vitamin C 65-125 MG TABS Take by mouth.    . latanoprost (XALATAN) 0.005 % ophthalmic solution Apply 1 drop to eye daily.    Marland Kitchen levothyroxine (SYNTHROID, LEVOTHROID) 137 MCG tablet Take 137 mcg by mouth daily before breakfast.    . magnesium oxide (MAG-OX) 400 MG tablet Take 1 tablet by mouth daily.     . metoprolol tartrate (LOPRESSOR) 25 MG tablet Take 25 mg by mouth 2 (two) times daily.    . ranitidine (ZANTAC) 150 MG capsule  Take 1 capsule by mouth 2 (two) times daily.    Marland Kitchen UNABLE TO FIND Compound Iron Infusion Every quarter    . Vitamin D, Ergocalciferol, (DRISDOL) 50000 UNITS CAPS capsule Take 50,000 Units by mouth once a week.     No current facility-administered medications for this visit.    Family History  Problem Relation Age of Onset  . Heart attack Father     dec age 15  . Hypertension Father   . Rheum arthritis Maternal Grandmother   . Migraines Maternal Grandmother   . Cancer Maternal Grandfather 1    colon ca--DEC age 30  . Diabetes Maternal Grandfather   . Hypertension Mother   . Hyperlipidemia Mother   . Migraines Mother   . Seizures Mother     epilepsy  . Stroke Mother     TIA's  . Stroke Paternal Grandmother     multiple  . Thyroid disease Paternal Grandmother     goiter-hypothyroid  . Thyroid  disease Sister     hypothyroid  . Thyroid disease Sister     hypothyroid    ROS:  Pertinent items are noted in HPI.  Otherwise, a comprehensive ROS was negative.  Exam:   BP 122/72 mmHg  Pulse 46  Resp 14  Ht 5' 5"  (1.651 m)  Wt 228 lb 12.8 oz (103.783 kg)  BMI 38.07 kg/m2  LMP 12/27/2015 (Approximate)    General appearance: alert, cooperative and appears stated age Head: Normocephalic, without obvious abnormality, atraumatic Neck: no adenopathy, supple, symmetrical, trachea midline and thyroid normal to inspection and palpation Lungs: clear to auscultation bilaterally Breasts: normal appearance, no masses or tenderness, Inspection negative, No nipple discharge or bleeding, No axillary or supraclavicular adenopathy, left nipple inverted (old change). Heart: regular rate and rhythm Abdomen: incisions:  Yes.    Scattered laparoscopic , soft, non-tender; no masses, no organomegaly Extremities: extremities normal, atraumatic, no cyanosis or edema Skin: Skin color, texture, turgor normal. No rashes or lesions Lymph nodes: Cervical, supraclavicular, and axillary nodes normal. No abnormal inguinal nodes palpated Neurologic: Grossly normal  Pelvic: External genitalia:  no lesions              Urethra:  normal appearing urethra with no masses, tenderness or lesions              Bartholins and Skenes: normal                 Vagina: normal appearing vagina with normal color and discharge, no lesions.  Good Kegel.              Cervix: no lesions.  IUD strings seen.  IUD removed with patient's verbal permission.              Pap taken: No. Bimanual Exam:  Uterus:  normal size, contour, position, consistency, mobility, non-tender              Adnexa: normal adnexa and no mass, fullness, tenderness              Rectal exam: Yes.  .  Confirms.              Anus:  normal sphincter tone, no lesions  Chaperone was present for exam.  Assessment:   Well woman visit with normal exam. Expired  IUD - removed.  Menopausal symptoms.  STD testing for HIV and Hep C. Stress incontinence.   Plan: Yearly mammogram recommended after age 57.  Recommended self breast exam.  Pap and  HR HPV as above. Discussed Calcium, Vitamin D, regular exercise program including cardiovascular and weight bearing exercise. Labs performed.  Yes.  .   See orders.  HIV, Hep C, FSH. Prescription medication(s) given.  No..   Kegel's, weight loss or GSI.  Discussed PT and surgery if needed. Follow up annually and prn.      After visit summary provided.

## 2016-04-06 NOTE — Patient Instructions (Signed)

## 2016-04-07 LAB — FOLLICLE STIMULATING HORMONE: FSH: 12.2 m[IU]/mL

## 2016-05-08 ENCOUNTER — Encounter: Payer: Self-pay | Admitting: Obstetrics and Gynecology

## 2016-05-09 ENCOUNTER — Telehealth: Payer: Self-pay

## 2016-05-09 NOTE — Telephone Encounter (Signed)
Spoke with patient. Advised of message as seen below from [place]. She is agreeable and verbalizes understanding. Appointment scheduled for 05/11/2016 at 9 am with Dr.Silva. She is agreeable to date and time.  Routing to provider for final review. Patient agreeable to disposition. Will close encounter.

## 2016-05-09 NOTE — Telephone Encounter (Signed)
Telephone encounter created to discuss mychart message with patient.

## 2016-05-09 NOTE — Telephone Encounter (Signed)
Hi Dr. Quincy Simmonds,         Wanted to let you know that after the Mirena IUD removal on April 12th, I have been pretty good until now. My last period was in January of this year.  I started my period again on May 11th, and it has been pretty heavy most of the time. I have been wearing 3 pads at one time for the last couple of days and I think it may be making my anemia worse. My heart is beginning to have more palpitations. I do have an IV iron infusion scheduled for this Tuesday, so I hope that will help replenish my iron stores that I am losing with this cycle. I have slowed down some now, but will keep you posted.  I know you wanted me to let you know how things were progressing after the IUD removal so I thought I would pass this along to you.  Thank you for your help and my email address has also changed to [email or web address]. Phone number is still the same...[phone].         Thank you and hope you have a good week,        Maria Meza    Left message to call Verline Lema at [phone].

## 2016-05-09 NOTE — Telephone Encounter (Signed)
Spoke with patient. Patient had her Mirena IUD removed in April 06, 2016. Patient states that her cycle started on 05/05/2016. On 5/12-5/13 she was heaving to wear 3 pads at one time due to increased bleeding. Reports she now is wearing a pad that she is changing every 3 hours. States she was experiencing increased heart palpitations when she was having heaving bleeding on 5/12-5/13. Denies any current heart palpitations, shortness of breath, or dizziness. She is scheduled to have a iron transfusion tomorrow for anemia. Advised I will speak with Dr.Silva and return call with additional recommendations. She is agreeable.

## 2016-05-09 NOTE — Telephone Encounter (Signed)
Patient returned call

## 2016-05-09 NOTE — Telephone Encounter (Signed)
Please make an appointment for me to see patient this week.  I would like to discuss options to treat her menses.

## 2016-05-11 ENCOUNTER — Telehealth: Payer: Self-pay | Admitting: Obstetrics and Gynecology

## 2016-05-11 ENCOUNTER — Encounter: Payer: Self-pay | Admitting: Obstetrics and Gynecology

## 2016-05-11 ENCOUNTER — Ambulatory Visit (INDEPENDENT_AMBULATORY_CARE_PROVIDER_SITE_OTHER): Payer: BLUE CROSS/BLUE SHIELD | Admitting: Obstetrics and Gynecology

## 2016-05-11 VITALS — BP 112/72 | HR 56 | Ht 65.0 in | Wt 231.4 lb

## 2016-05-11 DIAGNOSIS — N939 Abnormal uterine and vaginal bleeding, unspecified: Secondary | ICD-10-CM | POA: Diagnosis not present

## 2016-05-11 DIAGNOSIS — D649 Anemia, unspecified: Secondary | ICD-10-CM

## 2016-05-11 LAB — CBC
HCT: 39.7 % (ref 35.0–45.0)
HEMOGLOBIN: 12.9 g/dL (ref 11.7–15.5)
MCH: 30.2 pg (ref 27.0–33.0)
MCHC: 32.5 g/dL (ref 32.0–36.0)
MCV: 93 fL (ref 80.0–100.0)
MPV: 10.5 fL (ref 7.5–12.5)
Platelets: 382 10*3/uL (ref 140–400)
RBC: 4.27 MIL/uL (ref 3.80–5.10)
RDW: 14.8 % (ref 11.0–15.0)
WBC: 7.7 10*3/uL (ref 3.8–10.8)

## 2016-05-11 LAB — IRON: IRON: 154 ug/dL (ref 45–160)

## 2016-05-11 MED ORDER — NORETHINDRONE 0.35 MG PO TABS: 1.0000 | ORAL_TABLET | Freq: Every day | ORAL | Status: DC

## 2016-05-11 NOTE — Patient Instructions (Signed)
Norethindrone tablets (contraception) What is this medicine? NORETHINDRONE (nor eth IN drone) is an oral contraceptive. The product contains a female hormone known as a progestin. It is used to prevent pregnancy. This medicine may be used for other purposes; ask your health care provider or pharmacist if you have questions. What should I tell my health care provider before I take this medicine? They need to know if you have any of these conditions: -blood vessel disease or blood clots -breast, cervical, or vaginal cancer -diabetes -heart disease -kidney disease -liver disease -mental depression -migraine -seizures -stroke -vaginal bleeding -an unusual or allergic reaction to norethindrone, other medicines, foods, dyes, or preservatives -pregnant or trying to get pregnant -breast-feeding How should I use this medicine? Take this medicine by mouth with a glass of water. You may take it with or without food. Follow the directions on the prescription label. Take this medicine at the same time each day and in the order directed on the package. Do not take your medicine more often than directed. Contact your pediatrician regarding the use of this medicine in children. Special care may be needed. This medicine has been used in female children who have started having menstrual periods. A patient package insert for the product will be given with each prescription and refill. Read this sheet carefully each time. The sheet may change frequently. Overdosage: If you think you have taken too much of this medicine contact a poison control center or emergency room at once. NOTE: This medicine is only for you. Do not share this medicine with others. What if I miss a dose? Try not to miss a dose. Every time you miss a dose or take a dose late your chance of pregnancy increases. When 1 pill is missed (even if only 3 hours late), take the missed pill as soon as possible and continue taking a pill each day at  the regular time (use a back up method of birth control for the next 48 hours). If more than 1 dose is missed, use an additional birth control method for the rest of your pill pack until menses occurs. Contact your health care professional if more than 1 dose has been missed. What may interact with this medicine? Do not take this medicine with any of the following medications: -amprenavir or fosamprenavir -bosentan This medicine may also interact with the following medications: -antibiotics or medicines for infections, especially rifampin, rifabutin, rifapentine, and griseofulvin, and possibly penicillins or tetracyclines -aprepitant -barbiturate medicines, such as phenobarbital -carbamazepine -felbamate -modafinil -oxcarbazepine -phenytoin -ritonavir or other medicines for HIV infection or AIDS -St. John's wort -topiramate This list may not describe all possible interactions. Give your health care provider a list of all the medicines, herbs, non-prescription drugs, or dietary supplements you use. Also tell them if you smoke, drink alcohol, or use illegal drugs. Some items may interact with your medicine. What should I watch for while using this medicine? Visit your doctor or health care professional for regular checks on your progress. You will need a regular breast and pelvic exam and Pap smear while on this medicine. Use an additional method of birth control during the first cycle that you take these tablets. If you have any reason to think you are pregnant, stop taking this medicine right away and contact your doctor or health care professional. If you are taking this medicine for hormone related problems, it may take several cycles of use to see improvement in your condition. This medicine does not protect you  against HIV infection (AIDS) or any other sexually transmitted diseases. What side effects may I notice from receiving this medicine? Side effects that you should report to your  doctor or health care professional as soon as possible: -breast tenderness or discharge -pain in the abdomen, chest, groin or leg -severe headache -skin rash, itching, or hives -sudden shortness of breath -unusually weak or tired -vision or speech problems -yellowing of skin or eyes Side effects that usually do not require medical attention (report to your doctor or health care professional if they continue or are bothersome): -changes in sexual desire -change in menstrual flow -facial hair growth -fluid retention and swelling -headache -irritability -nausea -weight gain or loss This list may not describe all possible side effects. Call your doctor for medical advice about side effects. You may report side effects to FDA at 1-800-FDA-1088. Where should I keep my medicine? Keep out of the reach of children. Store at room temperature between 15 and 30 degrees C (59 and 86 degrees F). Throw away any unused medicine after the expiration date. NOTE: This sheet is a summary. It may not cover all possible information. If you have questions about this medicine, talk to your doctor, pharmacist, or health care provider.    2016, Elsevier/Gold Standard. (2012-08-31 16:41:35)

## 2016-05-11 NOTE — Progress Notes (Signed)
Patient ID: Maria Meza, female   DOB: 1956/07/12, 60 y.o.   MRN: [id number] GYNECOLOGY  VISIT   HPI: 60 y.o.   Married  Caucasian  female   G2P2002 with Patient's last menstrual period was 05/05/2016 (exact date).   here for heavy uterine bleeding.  Patient had Mirena IUD removed 04-06-16 and states began what seems to be a menstrual cycle 05-05-16.  She bled heavily for 3 days, having to wear 3 pads at a time. Bleeding has now slowed and almost stopped.  She states she had her quarterly irone infusion yesterday at [facility].  Not due for labs until June 2017.   Having quarterly iron infusions.  Told her low hemoglobin and iron is due to her menses prior to receiving the Mirena IUD.  Menses were not heavy with the Mirena.  FSH 12.2 on 04/06/16.  Normal pelvic ultrasound in December 2017.  Hx lupus and TIA.    GYNECOLOGIC HISTORY: Patient's last menstrual period was 05/05/2016 (exact date). Contraception:  None Menopausal hormone therapy:  None Last mammogram:  03-21-16 Rt.mass 10 o'clock;Rt.Diag.and US revealed probable fat necrosis;3 month f/u/[facility] Last pap smear:   06/2014 normal per patient        OB History    Gravida Para Term Preterm AB TAB SAB Ectopic Multiple Living   2 2 2       2          There are no active problems to display for this patient.   Past Medical History  Diagnosis Date  . Anemia   . Anxiety   . Dysmenorrhea   . Heart murmur   . Thyroid disease     hypothyroidism  . Urinary incontinence     with sneezing, coughing  . Lupus ([place])   . TIA (transient ischemic attack)     2 per patient  . Arrhythmia   . Sleep apnea     Past Surgical History  Procedure Laterality Date  . Tubal ligation    . Cholecystectomy      Current Outpatient Prescriptions  Medication Sig Dispense Refill  . albuterol (PROAIR HFA) 108 (90 BASE) MCG/ACT inhaler Inhale 1 puff into the lungs as needed.    Marland Kitchen b complex vitamins capsule Take 1  capsule by mouth 2 (two) times daily.    . folic acid (FOLVITE) 1 MG tablet Take 1 mg by mouth. 2 tablets daily    . hydroxychloroquine (PLAQUENIL) 200 MG tablet Take 200 mg by mouth daily.    . Iron-Vitamin C 65-125 MG TABS Take by mouth.    . latanoprost (XALATAN) 0.005 % ophthalmic solution Apply 1 drop to eye daily.    Marland Kitchen levothyroxine (SYNTHROID, LEVOTHROID) 137 MCG tablet Take 137 mcg by mouth daily before breakfast.    . magnesium oxide (MAG-OX) 400 MG tablet Take 1 tablet by mouth daily.     . metoprolol tartrate (LOPRESSOR) 25 MG tablet Take 25 mg by mouth 2 (two) times daily.    . ranitidine (ZANTAC) 150 MG capsule Take 1 capsule by mouth 2 (two) times daily.    Marland Kitchen UNABLE TO FIND Compound Iron Infusion Every quarter    . Vitamin D, Ergocalciferol, (DRISDOL) 50000 UNITS CAPS capsule Take 50,000 Units by mouth once a week.     No current facility-administered medications for this visit.     ALLERGIES: Hydrocodone and Pacerone  Family History  Problem Relation Age of Onset  . Heart attack Father     dec  age 1  . Hypertension Father   . Rheum arthritis Maternal Grandmother   . Migraines Maternal Grandmother   . Cancer Maternal Grandfather 22    colon ca--DEC age 48  . Diabetes Maternal Grandfather   . Hypertension Mother   . Hyperlipidemia Mother   . Migraines Mother   . Seizures Mother     epilepsy  . Stroke Mother     TIA's  . Stroke Paternal Grandmother     multiple  . Thyroid disease Paternal Grandmother     goiter-hypothyroid  . Thyroid disease Sister     hypothyroid  . Thyroid disease Sister     hypothyroid    Social History   Social History  . Marital Status: Married    Spouse Name: N/A  . Number of Children: N/A  . Years of Education: N/A   Occupational History  . Not on file.   Social History Main Topics  . Smoking status: Never Smoker   . Smokeless tobacco: Not on file  . Alcohol Use: No  . Drug Use: No  . Sexual Activity:    Partners: Male     Birth Control/ Protection: None, Post-menopausal   Other Topics Concern  . Not on file   Social History Narrative    ROS:  Pertinent items are noted in HPI.  PHYSICAL EXAMINATION:    BP 112/72 mmHg  Pulse 56  Ht 5' 5"  (1.651 m)  Wt 231 lb 6.4 oz (104.962 kg)  BMI 38.51 kg/m2  LMP 05/05/2016 (Exact Date)    General appearance: alert, cooperative and appears stated age  Pelvic: External genitalia:  no lesions              Urethra:  normal appearing urethra with no masses, tenderness or lesions              Bartholins and Skenes: normal                 Vagina: normal appearing vagina with normal color and discharge, no lesions              Cervix: no lesions and menstrual flow noted.              Bimanual Exam:  Uterus:  normal size, contour, position, consistency, mobility, non-tender              Adnexa: normal adnexa and no mass, fullness, tenderness            Chaperone was present for exam.  ASSESSMENT  Perimenopause.  Heavy menses with Mirena IUD recently removed. Hx iron deficiency.  Hx TIA and lupus.   PLAN  CBC, iron, and ferritin.  Micronor.  Instructed in used.  I do not recommend replacing a Mirena or having an endometrial ablation at this time.  Hysterectomy discussed for permanent tx of heavy menses.  Ibuprofen for heavy cycles and cramping.  Take for first 48 hours.  Follow u pin 3 months.    An After Visit Summary was printed and given to the patient.  __15____ minutes face to face time of which over 50% was spent in counseling.

## 2016-05-11 NOTE — Telephone Encounter (Signed)
Call to [facility] and spoke with pharmacy tech, Marzetta Board. She wanted to clarify RX dispense number. Advised Dr. Quincy Simmonds ordered to dispense one pack with 1 tablet po daily with 3 refills. She will process prescription and patient is notified that fill will be processed today. She will go to pick up pack of pills today and is advised to please return call if any questions or concerns.   Routing to provider for final review. Patient agreeable to disposition. Will close encounter.

## 2016-05-11 NOTE — Telephone Encounter (Signed)
Patient called and said, "I saw Dr. Quincy Simmonds this morning and she prescribed [name] or progesterone for me. When I went to the pharmacy they said they had questions for the doctor before I could pick it up. I was told to start the medicine today so I am not sure what I should do."  Pharmacy on file is correct.

## 2016-05-12 LAB — FERRITIN: FERRITIN: 197 ng/mL (ref 20–288)

## 2016-05-12 LAB — HEMOGLOBIN, FINGERSTICK: Hemoglobin, fingerstick: 13.3 g/dL (ref 12.0–16.0)

## 2016-05-16 ENCOUNTER — Other Ambulatory Visit: Payer: Self-pay | Admitting: Obstetrics and Gynecology

## 2016-05-16 DIAGNOSIS — N6310 Unspecified lump in the right breast, unspecified quadrant: Secondary | ICD-10-CM

## 2016-05-20 ENCOUNTER — Telehealth: Payer: Self-pay

## 2016-05-20 DIAGNOSIS — N6311 Unspecified lump in the right breast, upper outer quadrant: Secondary | ICD-10-CM

## 2016-05-20 NOTE — Telephone Encounter (Signed)
[place], [place] requesting mammogram report from 2016.  Advised we had faxed a release for this information but never received report.  [facility]'s [facility] mammogram at their facility was 10-03-14(patient had stated at [facility] 04-06-16 last mmg 09/2015). Routed to Dr. Antony Blackbird.

## 2016-05-20 NOTE — Telephone Encounter (Signed)
Please coordinate so that patient may have a mammogram done in June when she returns to the [facility] for a follow up breast ultrasound in June.  (She is in recall for June 2017.) This will require a call to [place] and to the patient.  She is apparently behind in her routine screening.  Last routine mammogram was done in 2015.  Cc- Marisa Sprinkles

## 2016-05-26 NOTE — Telephone Encounter (Signed)
Call to patient and advised that screening breast imaging will need to be completed with ultrasound that she has scheduled. Advised patient will be diagnostic bilateral since follow up of previously known mass. Patient agreeable.  Added Bilateral 3D mammogram to her [facility] Breast Ultrasound appointment on 06/02/16. She will need to arrive at 1230 for check in.  She is agreeable and expresses thanks for the follow up from Dr. Quincy Simmonds.  Routing to provider for final review. Patient agreeable to disposition. Will close encounter.

## 2016-06-02 ENCOUNTER — Ambulatory Visit
Admission: RE | Admit: 2016-06-02 | Discharge: 2016-06-02 | Disposition: A | Discharge status: Home / Self Care | Payer: BLUE CROSS/BLUE SHIELD | Source: Ambulatory Visit | Attending: Obstetrics and Gynecology | Admitting: Obstetrics and Gynecology

## 2016-06-02 DIAGNOSIS — N6311 Unspecified lump in the right breast, upper outer quadrant: Secondary | ICD-10-CM

## 2016-06-02 DIAGNOSIS — N6310 Unspecified lump in the right breast, unspecified quadrant: Secondary | ICD-10-CM

## 2016-06-25 DIAGNOSIS — I499 Cardiac arrhythmia, unspecified: Secondary | ICD-10-CM

## 2016-06-25 HISTORY — PX: OTHER SURGICAL HISTORY: SHX169

## 2016-06-25 HISTORY — DX: Cardiac arrhythmia, unspecified: I49.9

## 2016-08-15 ENCOUNTER — Encounter: Payer: Self-pay | Admitting: Obstetrics and Gynecology

## 2016-08-15 ENCOUNTER — Ambulatory Visit (INDEPENDENT_AMBULATORY_CARE_PROVIDER_SITE_OTHER): Payer: BLUE CROSS/BLUE SHIELD | Admitting: Obstetrics and Gynecology

## 2016-08-15 VITALS — BP 116/72 | HR 68 | Resp 16 | Ht 65.0 in | Wt 227.0 lb

## 2016-08-15 DIAGNOSIS — Z3009 Encounter for other general counseling and advice on contraception: Secondary | ICD-10-CM

## 2016-08-15 MED ORDER — NORETHINDRONE 0.35 MG PO TABS: 1.0000 | ORAL_TABLET | Freq: Every day | ORAL | 0 refills | Status: DC

## 2016-08-15 NOTE — Patient Instructions (Signed)
Stop the birth control in 3 months and then call to report if you had a cycle or not.

## 2016-08-15 NOTE — Progress Notes (Signed)
GYNECOLOGY  VISIT   HPI: 60 y.o.   Married  Caucasian  female   G2P2002 with Patient's last menstrual period was 05/05/2016 (exact date).   here for recheck after starting [name] No more cycles since starting progesterone OCPs. Some bloating.  Minor breast tenderness.  Mirena IUD removed 04/06/16.  Menses started 05/05/16.  Hx anemia and iron infusions due to heavy menses.  Last was done April.  This was the reason for the previous IUD insertion.   FSH 12.2 on 04/06/16.   GYNECOLOGIC HISTORY: Patient's last menstrual period was 05/05/2016 (exact date). Contraception:  Post menopausal Menopausal hormone therapy:  Nira Conn Last mammogram:  06/02/16 BIRADS1 negative Last pap smear:   06/2014 normal per patient        OB History    Gravida Para Term Preterm AB Living   2 2 2     2    SAB TAB Ectopic Multiple Live Births                     There are no active problems to display for this patient.   Past Medical History:  Diagnosis Date  . Anemia   . Anxiety   . Arrhythmia   . Dysmenorrhea   . Heart murmur   . Lupus ([place])   . Sleep apnea   . Thyroid disease    hypothyroidism  . TIA (transient ischemic attack)    2 per patient  . Urinary incontinence    with sneezing, coughing    Past Surgical History:  Procedure Laterality Date  . CHOLECYSTECTOMY    . TUBAL LIGATION      Current Outpatient Prescriptions  Medication Sig Dispense Refill  . albuterol (PROAIR HFA) 108 (90 BASE) MCG/ACT inhaler Inhale 1 puff into the lungs as needed.    Marland Kitchen b complex vitamins capsule Take 1 capsule by mouth 2 (two) times daily.    . folic acid (FOLVITE) 1 MG tablet Take 1 mg by mouth. 2 tablets daily    . hydroxychloroquine (PLAQUENIL) 200 MG tablet Take 200 mg by mouth daily.    . Iron-Vitamin C 65-125 MG TABS Take by mouth.    . latanoprost (XALATAN) 0.005 % ophthalmic solution Apply 1 drop to eye daily.    Marland Kitchen levothyroxine (SYNTHROID, LEVOTHROID) 137 MCG tablet Take 137 mcg by mouth  daily before breakfast.    . magnesium oxide (MAG-OX) 400 MG tablet Take 1 tablet by mouth daily.     . metoprolol tartrate (LOPRESSOR) 25 MG tablet Take 25 mg by mouth 2 (two) times daily.    . norethindrone (MICRONOR,CAMILA,ERRIN) 0.35 MG tablet Take 1 tablet (0.35 mg total) by mouth daily. 1 Package 3  . ranitidine (ZANTAC) 150 MG capsule Take 1 capsule by mouth 2 (two) times daily.    Marland Kitchen UNABLE TO FIND Compound Iron Infusion Every quarter    . Vitamin D, Ergocalciferol, (DRISDOL) 50000 UNITS CAPS capsule Take 50,000 Units by mouth once a week.     No current facility-administered medications for this visit.      ALLERGIES: Hydrocodone and Pacerone [amiodarone]  Family History  Problem Relation Age of Onset  . Heart attack Father     dec age 41  . Hypertension Father   . Rheum arthritis Maternal Grandmother   . Migraines Maternal Grandmother   . Cancer Maternal Grandfather 1    colon ca--DEC age 49  . Diabetes Maternal Grandfather   . Hypertension Mother   . Hyperlipidemia Mother   .  Migraines Mother   . Seizures Mother     epilepsy  . Stroke Mother     TIA's  . Stroke Paternal Grandmother     multiple  . Thyroid disease Paternal Grandmother     goiter-hypothyroid  . Thyroid disease Sister     hypothyroid  . Thyroid disease Sister     hypothyroid    Social History   Social History  . Marital status: Married    Spouse name: N/A  . Number of children: N/A  . Years of education: N/A   Occupational History  . Not on file.   Social History Main Topics  . Smoking status: Never Smoker  . Smokeless tobacco: Not on file  . Alcohol use No  . Drug use: No  . Sexual activity: Not Currently    Partners: Male    Birth control/ protection: None, Post-menopausal   Other Topics Concern  . Not on file   Social History Narrative  . No narrative on file    ROS:  Pertinent items are noted in HPI.  PHYSICAL EXAMINATION:    LMP 05/05/2016 (Exact Date)     General  appearance: alert, cooperative and appears stated age   Pelvic: External genitalia:  no lesions              Urethra:  normal appearing urethra with no masses, tenderness or lesions              Bartholins and Skenes: normal                 Vagina: normal appearing vagina with normal color and discharge, no lesions              Cervix: no lesions                Bimanual Exam:  Uterus:  normal size, contour, position, consistency, mobility, non-tender              Adnexa: no mass, fullness, tenderness      Chaperone was present for exam.  ASSESSMENT  Micronor for cycle control.  Premenopausal by [facility] in May 2017.  PLAN  Continue Micronor for 3 more months.  Discussed potential side effects of the birth control. Call to report whether or not cycle occurred after stops Micronor.  Follow up for annual exam in April 2018.     An After Visit Summary was printed and given to the patient.  _15_____ minutes face to face time of which over 50% was spent in counseling.

## 2016-08-17 DIAGNOSIS — I493 Ventricular premature depolarization: Secondary | ICD-10-CM | POA: Insufficient documentation

## 2016-08-26 HISTORY — PX: OTHER SURGICAL HISTORY: SHX169

## 2016-08-26 HISTORY — PX: TRANSTHORACIC ECHOCARDIOGRAM: SHX275

## 2016-08-27 ENCOUNTER — Other Ambulatory Visit: Payer: Self-pay | Admitting: Obstetrics and Gynecology

## 2016-08-30 NOTE — Telephone Encounter (Signed)
Medication refill request: [name] 0.35MG Last AEX:  04/06/16 BS Next AEX: 04/07/17 Last MMG (if hormonal medication request): 06/02/16 BIRADS1 negative Refill authorized: 08/15/16; already done

## 2016-11-12 ENCOUNTER — Telehealth: Payer: Self-pay | Admitting: Obstetrics and Gynecology

## 2016-11-14 NOTE — Telephone Encounter (Signed)
Medication refill request: Maria Meza  Last AEX:  04-06-16 Next AEX: 04-07-17  Last MMG (if hormonal medication request):06-02-16 WNL  Refill authorized: please advise  Sending to JJ since BS is out of office

## 2016-11-14 NOTE — Telephone Encounter (Signed)
Left message to call regarding refill -eh

## 2016-11-14 NOTE — Telephone Encounter (Signed)
Dr Quincy Simmonds wanted to see if she bleed after stopping the micronor. She is 30, I would have her stop it and call with bleeding

## 2016-11-15 ENCOUNTER — Telehealth: Payer: Self-pay | Admitting: *Deleted

## 2016-11-15 NOTE — Telephone Encounter (Signed)
Patient is returning a call to [place].

## 2016-11-15 NOTE — Telephone Encounter (Signed)
Patient called back regarding RX refill request for Micronor from her pharmacy. She did not request this she is aware that she is suppose stop the Micronor and see if she starts bleeding. Patient will call back and report any bleeding -eh

## 2016-11-15 NOTE — Telephone Encounter (Signed)
Left another message regarding RX request

## 2016-11-30 ENCOUNTER — Encounter: Payer: Self-pay | Admitting: Obstetrics and Gynecology

## 2016-11-30 NOTE — Telephone Encounter (Signed)
Called patient in response to her MyChart message regarding heart palpitations. Advised her Dr.Silva recommended she call her PCP for evaluation of heart palpitations. Patient gave verbal acknowledgement and will call PCP for evaluation and thanked me for calling her.

## 2016-12-23 DIAGNOSIS — G4733 Obstructive sleep apnea (adult) (pediatric): Secondary | ICD-10-CM | POA: Insufficient documentation

## 2016-12-26 DIAGNOSIS — K900 Celiac disease: Secondary | ICD-10-CM

## 2016-12-26 HISTORY — DX: Celiac disease: K90.0

## 2017-04-06 NOTE — Progress Notes (Signed)
61 y.o. G36P2002 Married Caucasian female here for annual exam.    Mirena IUD removed 04/06/16. Had menses 05/05/16.  [place] 12.2 04/06/16.  Was on progesterone only OCPs. No menses for one year.  Stopped POPs in November 2017.  Some hot flashes. Not significant.   Patient dealing with anemia and iron deficiency.  Now has celiac. Taking iron supplement and does ferritin infusions.  Did this last one year ago at [facility].  PCP:  Lonia Mad, MD    Patient's last menstrual period was 04/16/2016 (exact date).           Sexually active: Yes.   female The current method of family planning is tubal ligation.    Exercising: No.   Smoker:  no  Health Maintenance: Pap: 06/2014 normal per patient History of abnormal Pap:  no MMG: 03-21-16 3D Rt.Diag.MMG & Rt.Br.US--Density B/probable fat necrosis in upper-outer right breast. 95monthUKoreafollow up rec/BiRads3/probably benign:TBC 06-02-16 Rt.Diag.MMG & Rt.Br.US--Density B/resolution of probable benign mass previously seen, likely represents fat necrosis/Neg/screening 1year/BiRads1:TBC Colonoscopy: 03/2015 normal in [place];next due 03/2025.  BMD: 02/2017 [place]  2012 Gardasil:   n/a HIV: Years ago--Neg Hep C: 04-06-16 Neg Screening Labs:  Hb today: PCP, Urine today: not done   reports that she has never smoked. She has never used smokeless tobacco. She reports that she does not drink alcohol or use drugs.  Past Medical History:  Diagnosis Date  . Adult celiac disease 2018  . Anemia   . Anxiety   . Arrhythmia   . Dysmenorrhea   . Heart murmur   . Lupus   . Sleep apnea   . Thyroid disease    hypothyroidism  . TIA (transient ischemic attack)    2 per patient  . Urinary incontinence    with sneezing, coughing    Past Surgical History:  Procedure Laterality Date  . CHOLECYSTECTOMY    . TUBAL LIGATION      Current Outpatient Prescriptions  Medication Sig Dispense Refill   . albuterol (PROAIR HFA) 108 (90 BASE) MCG/ACT inhaler Inhale 1 puff into the lungs as needed.    .Marland Kitchenaspirin EC 81 MG tablet Take 1 tablet by mouth daily.    .Marland Kitchenb complex vitamins capsule Take 1 capsule by mouth 2 (two) times daily.    . Cholecalciferol (VITAMIN D3) 2000 units TABS Take 1 tablet by mouth 2 (two) times daily.    . folic acid (FOLVITE) 1 MG tablet Take 1 mg by mouth. 2 tablets daily    . hydroxychloroquine (PLAQUENIL) 200 MG tablet Take 200 mg by mouth daily.    . Iron-Vitamin C 65-125 MG TABS Take by mouth.    . latanoprost (XALATAN) 0.005 % ophthalmic solution Apply 1 drop to eye daily.    .Marland Kitchenlevothyroxine (SYNTHROID, LEVOTHROID) 137 MCG tablet Take 137 mcg by mouth daily before breakfast.    . magnesium oxide (MAG-OX) 400 MG tablet Take 1 tablet by mouth daily.     . metoprolol tartrate (LOPRESSOR) 25 MG tablet Take 25 mg by mouth 2 (two) times daily.     . ranitidine (ZANTAC) 150 MG capsule Take 1 capsule by mouth 2 (two) times daily.    .Marland KitchenUNABLE TO FIND Compound Iron Infusion Every quarter     No current facility-administered medications for this visit.     Family History  Problem Relation Age of Onset  . Heart attack Father     dec age 62 .  Hypertension Father   . Hypertension Mother   . Hyperlipidemia Mother   . Migraines Mother   . Seizures Mother     epilepsy  . Stroke Mother     TIA's  . Thyroid disease Sister     hypothyroid  . Thyroid disease Sister     hypothyroid  . Rheum arthritis Maternal Grandmother   . Migraines Maternal Grandmother   . Cancer Maternal Grandfather 35    colon ca--DEC age 30  . Diabetes Maternal Grandfather   . Stroke Paternal Grandmother     multiple  . Thyroid disease Paternal Grandmother     goiter-hypothyroid    ROS:  Pertinent items are noted in HPI.  Otherwise, a comprehensive ROS was negative.  Exam:   BP 114/72 (BP Location: Right Arm, Patient Position: Sitting, Cuff Size: Large)   Pulse 70   Resp 16   Ht 5'  4.5" (1.638 m)   Wt 233 lb 6.4 oz (105.9 kg)   LMP 04/16/2016 (Exact Date)   BMI 39.44 kg/m     General appearance: alert, cooperative and appears stated age Head: Normocephalic, without obvious abnormality, atraumatic Neck: no adenopathy, supple, symmetrical, trachea midline and thyroid normal to inspection and palpation Lungs: clear to auscultation bilaterally Breasts: normal appearance, no masses or tenderness, No nipple retraction or dimpling, No nipple discharge or bleeding, No axillary or supraclavicular adenopathy Heart: regular rate and rhythm Abdomen: soft, non-tender; no masses, no organomegaly Extremities: extremities normal, atraumatic, no cyanosis or edema Skin: Skin color, texture, turgor normal. No rashes or lesions Lymph nodes: Cervical, supraclavicular, and axillary nodes normal. No abnormal inguinal nodes palpated Neurologic: Grossly normal  Pelvic: External genitalia:  no lesions              Urethra:  normal appearing urethra with no masses, tenderness or lesions              Bartholins and Skenes: normal                 Vagina: normal appearing vagina with normal color and discharge, no lesions              Cervix: no lesions              Pap taken: Yes.   Bimanual Exam:  Uterus:  normal size, contour, position, consistency, mobility, non-tender              Adnexa: no mass, fullness, tenderness              Rectal exam: Yes.  .  Confirms.              Anus:  normal sphincter tone, no lesions  Chaperone was present for exam.  Assessment:   Well woman visit with normal exam. Postmenopausal female. On Plaquenil.  Hx iron deficiency anemia.  Menopausal symptoms.  Started menopause late.  Plan: Mammogram screening discussed. Recommended self breast awareness. Pap and HR HPV as above.  Yearly pap.  Guidelines for Calcium, Vitamin D, regular exercise program including cardiovascular and weight bearing exercise. Will check CBC, ferritin and iron  level. Discussed black cohosh and soy for menopausal symptoms.   Follow up annually and prn.   After visit summary provided.

## 2017-04-07 ENCOUNTER — Encounter: Payer: Self-pay | Admitting: Obstetrics and Gynecology

## 2017-04-07 ENCOUNTER — Ambulatory Visit (INDEPENDENT_AMBULATORY_CARE_PROVIDER_SITE_OTHER): Payer: BLUE CROSS/BLUE SHIELD | Admitting: Obstetrics and Gynecology

## 2017-04-07 ENCOUNTER — Telehealth: Payer: Self-pay | Admitting: *Deleted

## 2017-04-07 VITALS — BP 114/72 | HR 70 | Resp 16 | Ht 64.5 in | Wt 233.4 lb

## 2017-04-07 DIAGNOSIS — Z8639 Personal history of other endocrine, nutritional and metabolic disease: Secondary | ICD-10-CM | POA: Diagnosis not present

## 2017-04-07 DIAGNOSIS — Z01419 Encounter for gynecological examination (general) (routine) without abnormal findings: Secondary | ICD-10-CM | POA: Diagnosis not present

## 2017-04-07 LAB — CBC
HEMATOCRIT: 39.4 % (ref 35.0–45.0)
Hemoglobin: 13 g/dL (ref 11.7–15.5)
MCH: 30 pg (ref 27.0–33.0)
MCHC: 33 g/dL (ref 32.0–36.0)
MCV: 91 fL (ref 80.0–100.0)
MPV: 10.7 fL (ref 7.5–12.5)
Platelets: 367 10*3/uL (ref 140–400)
RBC: 4.33 MIL/uL (ref 3.80–5.10)
RDW: 13.4 % (ref 11.0–15.0)
WBC: 7.5 10*3/uL (ref 3.8–10.8)

## 2017-04-07 LAB — IRON: Iron: 82 ug/dL (ref 45–160)

## 2017-04-07 NOTE — Telephone Encounter (Signed)
I think the patient should reschedule.

## 2017-04-07 NOTE — Telephone Encounter (Signed)
Correction, Ok for patient to keep her appointment as it is her spouse who has the shingles. Encounter closed.

## 2017-04-07 NOTE — Patient Instructions (Signed)

## 2017-04-07 NOTE — Telephone Encounter (Signed)
Spoke with patient. Patient states her spouse was seen at the doctor a couple of days ago and was advised he may have shingles. Patient states she was concerned about coming to AEX appointment scheduled for today at 12:45. Patient states spouse does not have any raised areas, just a rash and no oozing. Patient states she has been washing hands and not even sure that is what he has. Patient states she just wanted to be sure it was ok for her to come to her appointment and not have the possibility of sharing anything. Advised patient to keep AEX appointment scheduled for today. Advised patient would update Dr. Quincy Simmonds and return call if any additional recommendations. Patient verbalizes understanding and is agreeable.  Dr. Quincy Simmonds- ok to keep appointment?

## 2017-04-08 LAB — FERRITIN: FERRITIN: 119 ng/mL (ref 20–288)

## 2017-04-11 LAB — IPS PAP TEST WITH HPV

## 2017-06-15 ENCOUNTER — Other Ambulatory Visit: Payer: Self-pay | Admitting: Obstetrics and Gynecology

## 2017-06-15 DIAGNOSIS — Z1231 Encounter for screening mammogram for malignant neoplasm of breast: Secondary | ICD-10-CM

## 2017-06-23 ENCOUNTER — Ambulatory Visit
Admission: RE | Admit: 2017-06-23 | Discharge: 2017-06-23 | Disposition: A | Discharge status: Home / Self Care | Payer: BLUE CROSS/BLUE SHIELD | Source: Ambulatory Visit | Attending: Obstetrics and Gynecology | Admitting: Obstetrics and Gynecology

## 2017-06-23 DIAGNOSIS — Z1231 Encounter for screening mammogram for malignant neoplasm of breast: Secondary | ICD-10-CM

## 2018-03-26 HISTORY — PX: CATARACT EXTRACTION: SUR2

## 2018-04-11 ENCOUNTER — Ambulatory Visit (INDEPENDENT_AMBULATORY_CARE_PROVIDER_SITE_OTHER): Payer: BLUE CROSS/BLUE SHIELD | Admitting: Obstetrics and Gynecology

## 2018-04-11 ENCOUNTER — Encounter: Payer: Self-pay | Admitting: Obstetrics and Gynecology

## 2018-04-11 ENCOUNTER — Other Ambulatory Visit (HOSPITAL_COMMUNITY)
Admission: RE | Admit: 2018-04-11 | Discharge: 2018-04-11 | Disposition: A | Discharge status: Home / Self Care | Payer: BLUE CROSS/BLUE SHIELD | Source: Ambulatory Visit | Attending: Obstetrics and Gynecology | Admitting: Obstetrics and Gynecology

## 2018-04-11 ENCOUNTER — Other Ambulatory Visit: Payer: Self-pay

## 2018-04-11 VITALS — BP 116/66 | HR 60 | Resp 14 | Ht 63.0 in | Wt 246.0 lb

## 2018-04-11 DIAGNOSIS — Z01419 Encounter for gynecological examination (general) (routine) without abnormal findings: Secondary | ICD-10-CM | POA: Diagnosis not present

## 2018-04-11 NOTE — Patient Instructions (Signed)

## 2018-04-11 NOTE — Progress Notes (Signed)
62 y.o. G37P2002 Married Caucasian female here for annual exam.  lab4  FSH 12.2 on 04/06/16. Had a menses one month later and then no further.  Hot flash every 7 weeks.   Right cataract surgery recently. Will do the left eye this month also.  Weight changes.  Is not on increased thyroid medication.   Still with palpitations.  Taking metoprolol.  Not clear per patient if she has lupus or not.  May have connective tissue disease.  Weaning off Plaquenil.  PCP:  Lonia Mad, MD   Patient's last menstrual period was 05/05/2016 (exact date).           Sexually active: Yes.    The current method of family planning is tubal ligation.    Exercising: No.  none. Smoker:  no  Health Maintenance: Pap:  04/07/17 Pap and HR HPV negative History of abnormal Pap:  no MMG:  06/23/17 BIRADS 1 negative/density b Colonoscopy:  03/2015 normal in [place];next due 03/2025 BMD:   02/2017  Result:  Normal with Rheumatologist in [place], [place]:  2012 Gardasil:   n/a HIV and Hep C: 04/06/16 Negative Screening Labs: PCP Hb today: PCP   reports that she has never smoked. She has never used smokeless tobacco. She reports that she does not drink alcohol or use drugs.  Past Medical History:  Diagnosis Date  . Adult celiac disease 2018  . Anemia   . Anxiety   . Arrhythmia   . Cataract cortical, senile, bilateral   . Dysmenorrhea   . Heart murmur   . Lupus ([place])   . Sleep apnea   . Thyroid disease    hypothyroidism  . TIA (transient ischemic attack)    2 per patient  . Urinary incontinence    with sneezing, coughing    Past Surgical History:  Procedure Laterality Date  . CATARACT EXTRACTION Right 03/2018  . CHOLECYSTECTOMY    . TUBAL LIGATION      Current Outpatient Medications  Medication Sig Dispense Refill  . albuterol (PROAIR HFA) 108 (90 BASE) MCG/ACT inhaler Inhale 1 puff into the lungs as needed.    . Cholecalciferol (VITAMIN D3) 2000 units TABS Take 1 tablet by mouth 2  (two) times daily.    . folic acid (FOLVITE) 1 MG tablet Take 1 mg by mouth. 2 tablets daily    . hydroxychloroquine (PLAQUENIL) 200 MG tablet Take 200 mg by mouth daily.    . Iron-Vitamin C 65-125 MG TABS Take by mouth.    . latanoprost (XALATAN) 0.005 % ophthalmic solution Apply 1 drop to eye daily.    Marland Kitchen levothyroxine (SYNTHROID, LEVOTHROID) 150 MCG tablet Take 1 tablet by mouth daily.  3  . magnesium oxide (MAG-OX) 400 MG tablet Take 1 tablet by mouth daily.     . meclizine (ANTIVERT) 25 MG tablet Take 1 tablet by mouth as needed.    . metoprolol tartrate (LOPRESSOR) 25 MG tablet Take 25 mg by mouth 2 (two) times daily.     . ranitidine (ZANTAC) 150 MG capsule Take 1 capsule by mouth as needed.     . Vitamin D, Ergocalciferol, (DRISDOL) 50000 units CAPS capsule Take 1 capsule by mouth 3 (three) times a week.  2   No current facility-administered medications for this visit.     Family History  Problem Relation Age of Onset  . Heart attack Father        dec age 52  . Hypertension Father   . Hypertension Mother   .  Hyperlipidemia Mother   . Migraines Mother   . Seizures Mother        epilepsy  . Stroke Mother        TIA's  . Thyroid disease Sister        hypothyroid  . Thyroid disease Sister        hypothyroid  . Rheum arthritis Maternal Grandmother   . Migraines Maternal Grandmother   . Cancer Maternal Grandfather 14       colon ca--DEC age 72  . Diabetes Maternal Grandfather   . Stroke Paternal Grandmother        multiple  . Thyroid disease Paternal Grandmother        goiter-hypothyroid    Review of Systems  Constitutional: Positive for unexpected weight change.  Eyes: Negative.   Respiratory: Negative.   Cardiovascular: Positive for palpitations.  Gastrointestinal: Negative.   Endocrine: Positive for cold intolerance and heat intolerance.  Genitourinary: Negative.   Musculoskeletal: Negative.   Allergic/Immunologic: Negative.   Neurological: Negative.    Hematological: Negative.   Psychiatric/Behavioral: Negative.     Exam:   BP 116/66 (BP Location: Right Arm, Patient Position: Sitting, Cuff Size: Large)   Pulse 60   Resp 14   Ht 5' 3"  (1.6 m)   Wt 246 lb (111.6 kg)   LMP 05/05/2016 (Exact Date)   BMI 43.58 kg/m     General appearance: alert, cooperative and appears stated age Head: Normocephalic, without obvious abnormality, atraumatic Neck: no adenopathy, supple, symmetrical, trachea midline and thyroid normal to inspection and palpation Lungs: clear to auscultation bilaterally Breasts: normal appearance, no masses or tenderness, No nipple retraction or dimpling on right and left nipple inversion (old change), No nipple discharge or bleeding, No axillary or supraclavicular adenopathy Heart: regular rate and rhythm Abdomen: soft, non-tender; no masses, no organomegaly Extremities: extremities normal, atraumatic, no cyanosis or edema Skin: Skin color, texture, turgor normal.  3 cm patch of erythema with vesicles (from EKG sticker per patient). Lymph nodes: Cervical, supraclavicular, and axillary nodes normal. No abnormal inguinal nodes palpated Neurologic: Grossly normal  Pelvic: External genitalia:  no lesions              Urethra:  normal appearing urethra with no masses, tenderness or lesions              Bartholins and Skenes: normal                 Vagina: normal appearing vagina with normal color and discharge, no lesions              Cervix: no lesions              Pap taken: Yes.   Bimanual Exam:  Uterus:  normal size, contour, position, consistency, mobility, non-tender              Adnexa: no mass, fullness, tenderness              Rectal exam: Yes.  .  Confirms.              Anus:  normal sphincter tone, no lesions.  Small hemorrhoid noted.   Chaperone was present for exam.  Assessment:   Well woman visit with normal exam. Postmenopausal female. On Plaquenil.  May have connective tissue order but not lupus. Hx  iron deficiency anemia.  May be due to celiac disease. Menopausal symptoms.  Started menopause late. Hx TIA.  Hypothyroidism.  Recent dosage change of Synthroid.  Plan:  Mammogram screening. Recommended self breast awareness. Pap and HR HPV yearly while on immunosupressive medication.  Guidelines for Calcium, Vitamin D, regular exercise program including cardiovascular and weight bearing exercise.   Follow up annually and prn.   After visit summary provided.

## 2018-04-13 LAB — CYTOLOGY - PAP
Diagnosis: NEGATIVE
HPV: NOT DETECTED

## 2018-05-30 ENCOUNTER — Other Ambulatory Visit: Payer: Self-pay | Admitting: Obstetrics and Gynecology

## 2018-05-30 DIAGNOSIS — Z1231 Encounter for screening mammogram for malignant neoplasm of breast: Secondary | ICD-10-CM

## 2018-06-29 ENCOUNTER — Ambulatory Visit: Payer: BLUE CROSS/BLUE SHIELD

## 2018-07-13 ENCOUNTER — Ambulatory Visit: Payer: Self-pay

## 2018-09-03 ENCOUNTER — Ambulatory Visit
Admission: RE | Admit: 2018-09-03 | Discharge: 2018-09-03 | Disposition: A | Discharge status: Home / Self Care | Payer: Managed Care, Other (non HMO) | Source: Ambulatory Visit | Attending: Obstetrics and Gynecology | Admitting: Obstetrics and Gynecology

## 2018-09-03 DIAGNOSIS — Z1231 Encounter for screening mammogram for malignant neoplasm of breast: Secondary | ICD-10-CM

## 2019-05-10 ENCOUNTER — Ambulatory Visit: Payer: BLUE CROSS/BLUE SHIELD | Admitting: Obstetrics and Gynecology

## 2019-05-23 ENCOUNTER — Encounter: Payer: Self-pay | Admitting: Neurology

## 2019-05-23 ENCOUNTER — Telehealth: Payer: Self-pay | Admitting: Neurology

## 2019-05-23 NOTE — Addendum Note (Signed)
Addended by: Hope Pigeon on: 05/23/2019 02:19 PM   Modules accepted: Orders

## 2019-05-23 NOTE — Telephone Encounter (Signed)
Called pt. Updated med list, pharmacy, allergies, history on file for visit on 05/27/19 with Dr. Felecia Shelling. She confirmed she received email for VV.

## 2019-05-23 NOTE — Telephone Encounter (Signed)
Pt gave consent for VV on the phone/ Pt understands that although there may be some limitations with this type of visit, we will take all precautions to reduce any security or privacy concerns.  Pt understands that this will be treated like an in office visit and we will file with pt's insurance, and there may be a patient responsible charge related to this service. Sent email with link to [email or web address]

## 2019-05-27 ENCOUNTER — Encounter: Payer: Self-pay | Admitting: Neurology

## 2019-05-27 ENCOUNTER — Other Ambulatory Visit: Payer: Self-pay

## 2019-05-27 ENCOUNTER — Ambulatory Visit (INDEPENDENT_AMBULATORY_CARE_PROVIDER_SITE_OTHER): Payer: 59 | Admitting: Neurology

## 2019-05-27 DIAGNOSIS — M359 Systemic involvement of connective tissue, unspecified: Secondary | ICD-10-CM | POA: Diagnosis not present

## 2019-05-27 DIAGNOSIS — R200 Anesthesia of skin: Secondary | ICD-10-CM | POA: Insufficient documentation

## 2019-05-27 DIAGNOSIS — I493 Ventricular premature depolarization: Secondary | ICD-10-CM

## 2019-05-27 DIAGNOSIS — R42 Dizziness and giddiness: Secondary | ICD-10-CM | POA: Diagnosis not present

## 2019-05-27 NOTE — Progress Notes (Signed)
[facility]  PATIENT: Maria Meza DOB: 1956-04-03  REFERRING DOCTOR OR PCP:  Dr. Evette Meza ([facility]) Maria Meza (PCP) SOURCE: Patient, from [facility] rheumatology and cardiology, imaging and lab reports  _________________________________   HISTORICAL  CHIEF COMPLAINT:  Chief Complaint  Patient presents with  . Other    Lightheaded  . Numbness    Right chin   Virtual Visit via Video Note I connected with Maria Meza on 05/27/19 at  2:30 PM EDT by a video enabled telemedicine application and verified that I am speaking with the correct person.  I discussed the limitations of evaluation and management by telemedicine and the availability of in person appointments. The patient expressed understanding and agreed to proceed.   HISTORY OF PRESENT ILLNESS:  I had the pleasure of seeing your patient, Maria Meza, at [facility] for neurologic consultation regarding her various symptoms including lightheadedness and facial numbness  Since 2008, she has had episodes of dizziness and lightheadedness lasting 2-3 minutes.  These spells are not positional and orthostatic vital sings have been normal in the past.  They are more lightheadedness than vertigo but she has had a few episodes of vertigo lasting seconds the last couple weeks.   Sometimes she has palpitations.   She has had several Holter monitors.   She has symptomatic PVC's and sees Dr. Glennon Meza [facility]).  She has had loop recorder as well.  She is on metoprolol and previously had been tried on verapamil and amiodarone but had poor tolerability.      She reports numbness in the right chin for the past few months.   The numbness is only on the right and respects the midline.  There is no associated pain.       Other symptoms have only occurred rarely but bother her. She has had episodes of slurred speech or blurred vision.  A few years ago, she had one episode where the left eyelid  would not open up for many minutes.  She has had a few episodes of where she feels she needs to consciously breathe or consciously eat.   She notes numbness in the face.     She was told she had a mini-stroke on an MRI 10+ years ago and saw a neurologist.   No medications were started.   She had another MRI last year and was told no new strokes.   She reports, a doppler study did not show significant findings   She sees Dr. 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 .  ANA, double-stranded DNA and TTG were positive.  She has arthralgias, fatigue.  She has been on Plaquenil with benefit.   She has CPAP due to nocturnal hypoxia and CPAP.        REVIEW OF SYSTEMS: Constitutional: No fevers, chills, sweats, or change in appetite Eyes: No visual changes, double vision, eye pain Ear, nose and throat: No hearing loss, ear pain, nasal congestion, sore throat Cardiovascular: No chest pain, palpitations Respiratory: No shortness of breath at rest or with exertion.   No wheezes GastrointestinaI: No nausea, vomiting, diarrhea, abdominal pain, fecal incontinence Genitourinary: No dysuria, urinary retention or frequency.  No nocturia. Musculoskeletal:She notes some joint pain and muscle aches Integumentary: No rash, pruritus, skin lesions Neurological: as above Psychiatric: No depression at this time.  No anxiety Endocrine: No palpitations, diaphoresis, change in appetite, change in weigh or increased thirst Hematologic/Lymphatic: No anemia, purpura, petechiae. Allergic/Immunologic: No itchy/runny eyes, nasal congestion,  recent allergic reactions, rashes  ALLERGIES: Allergies  Allergen Reactions  . Hydrocodone Nausea And Vomiting  . Pacerone [Amiodarone] Other (See Comments)    "does not tolerate"--slows heart rate  . Propafenone Hcl     Caused very low HR    HOME MEDICATIONS:  Current Outpatient Medications:  .  albuterol (PROAIR HFA) 108 (90 BASE) MCG/ACT inhaler,  Inhale 1 puff into the lungs as needed., Disp: , Rfl:  .  folic acid (FOLVITE) 1 MG tablet, Take 1 mg by mouth. 2 tablets daily, Disp: , Rfl:  .  hydroxychloroquine (PLAQUENIL) 200 MG tablet, Take 400 mg by mouth 2 (two) times daily. , Disp: , Rfl:  .  Iron-Vitamin C 65-125 MG TABS, Take by mouth., Disp: , Rfl:  .  latanoprost (XALATAN) 0.005 % ophthalmic solution, Place 1 drop into both eyes daily. , Disp: , Rfl:  .  levothyroxine (SYNTHROID, LEVOTHROID) 150 MCG tablet, Take 1 tablet by mouth daily., Disp: , Rfl: 3 .  magnesium oxide (MAG-OX) 400 MG tablet, Take 1 tablet by mouth daily. , Disp: , Rfl:  .  meclizine (ANTIVERT) 25 MG tablet, Take 1 tablet by mouth as needed., Disp: , Rfl:  .  metoprolol tartrate (LOPRESSOR) 25 MG tablet, Take 25 mg by mouth 2 (two) times daily. 1.5 tablets twice daily, Disp: , Rfl:  .  VITAMIN D PO, Take 50,000 Units by mouth 3 (three) times a week., Disp: , Rfl:   PAST MEDICAL HISTORY: Past Medical History:  Diagnosis Date  . Adult celiac disease 2018  . Anemia   . Anxiety   . Arrhythmia   . Cataract cortical, senile, bilateral   . Dysmenorrhea   . Heart murmur   . Lupus ([place])   . Sleep apnea   . Thyroid disease    hypothyroidism  . TIA (transient ischemic attack)    2 per patient  . Urinary incontinence    with sneezing, coughing    PAST SURGICAL HISTORY: Past Surgical History:  Procedure Laterality Date  . CATARACT EXTRACTION Right 03/2018  . CATARACT EXTRACTION Left 03/2018  . CHOLECYSTECTOMY    . TUBAL LIGATION      FAMILY HISTORY: Family History  Problem Relation Age of Onset  . Heart attack Father        dec age 24  . Hypertension Father   . Hypertension Mother   . Hyperlipidemia Mother   . Migraines Mother   . Seizures Mother        epilepsy  . Stroke Mother        TIA's  . Thyroid disease Sister        hypothyroid  . Thyroid disease Sister        hypothyroid  . Rheum arthritis Maternal Grandmother   . Migraines  Maternal Grandmother   . Cancer Maternal Grandfather 45       colon ca--DEC age 32  . Diabetes Maternal Grandfather   . Stroke Paternal Grandmother        multiple  . Thyroid disease Paternal Grandmother        goiter-hypothyroid  . Breast cancer Maternal Aunt     SOCIAL HISTORY:  Social History   Socioeconomic History  . Marital status: Married    Spouse name: Not on file  . Number of children: Not on file  . Years of education: Not on file  . Highest education level: Not on file  Occupational History  . Not on file  Social Needs  .  Financial resource strain: Not on file  . Food insecurity:    Worry: Not on file    Inability: Not on file  . Transportation needs:    Medical: Not on file    Non-medical: Not on file  Tobacco Use  . Smoking status: Never Smoker  . Smokeless tobacco: Never Used  Substance and Sexual Activity  . Alcohol use: No    Alcohol/week: 0.0 standard drinks  . Drug use: No  . Sexual activity: Yes    Partners: Male    Birth control/protection: None, Post-menopausal  Lifestyle  . Physical activity:    Days per week: Not on file    Minutes per session: Not on file  . Stress: Not on file  Relationships  . Social connections:    Talks on phone: Not on file    Gets together: Not on file    Attends religious service: Not on file    Active member of club or organization: Not on file    Attends meetings of clubs or organizations: Not on file    Relationship status: Not on file  . Intimate partner violence:    Fear of current or ex partner: Not on file    Emotionally abused: Not on file    Physically abused: Not on file    Forced sexual activity: Not on file  Other Topics Concern  . Not on file  Social History Narrative   Right handed    Caffeine use: 1 cup coffee every morning     PHYSICAL EXAM  She is a well-developed well-nourished woman in no acute distress.  The head is normocephalic and atraumatic.  Sclera are anicteric.  Visible  skin appears normal.  The neck has a good range of motion.  Pharynx and tongue have normal appearance.    She is alert and fully oriented with fluent speech and good attention, knowledge and memory.    Extraocular muscles are intact.  Facial strength is normal.  Palatal elevation and tongue protrusion are midline.  She appears to have normal strength in the arms.  Rapid alternating movements and finger-nose-finger are performed well.     DIAGNOSTIC DATA (LABS, IMAGING, TESTING) - I reviewed patient records, labs, notes, testing and imaging myself where available.  Lab Results  Component Value Date   WBC 7.5 04/07/2017   HGB 13.0 04/07/2017   HCT 39.4 04/07/2017   MCV 91.0 04/07/2017   PLT 367 04/07/2017     ASSESSMENT AND PLAN  Right facial numbness - Plan: MR FACE/TRIGEMINAL WO/W CM  Episodic lightheadedness  Symptomatic PVCs  Undifferentiated connective tissue disease ([place])  In summary, Ms. Hora is a 63 year old woman who is reporting lightheadedness and chin numbness.  The etiology of the episodes of lightheadedness is not definite but they are more likely to be related to her symptomatic PVCs and to a neurologic disorder.  There is no orthostatic component or seizure type symptoms.  The numbness in the chin is more recent.  Although most likely not to be pathologic, we need to have an MRI of the face performed to make sure that there is not any abnormality along the course of the right V3 nerve.  It is not painful so no treatment is needed at this time.  I did discuss with her that if it becomes painful we could consider some medications that may help (gabapentin or a tricyclic).     We will let her know the results of the MRI.  Based on  the findings she may need additional evaluation or referral.  Thank you for asking to see Ms. Hassell Done.  Please let me know if I can be of further assistance or her other patients in the future.  Follow Up Instructions: I discussed the  assessment and treatment plan with the patient. The patient was provided an opportunity to ask questions and all were answered. The patient agreed with the plan and demonstrated an understanding of the instructions.    The patient was advised to call back or seek an in-person evaluation if the symptoms worsen or if the condition fails to improve as anticipated.  I provided 40 minutes of non-face-to-face time during this encounter.    Tamanika Heiney A. Felecia Shelling, MD, [facility] 01/02/4858, 2:76 PM Certified in Neurology, Clinical Neurophysiology, Sleep Medicine, Pain Medicine and Neuroimaging  [facility] [address] [place], [place] [address] [phone]

## 2019-05-29 ENCOUNTER — Telehealth: Payer: Self-pay | Admitting: Neurology

## 2019-05-29 DIAGNOSIS — R200 Anesthesia of skin: Secondary | ICD-10-CM

## 2019-05-29 NOTE — Telephone Encounter (Signed)
[employer] pending faxed notes. Case number is [id number].

## 2019-05-30 NOTE — Telephone Encounter (Signed)
Holli Humbles: [id number] (exp. 05/30/19 to 11/26/19) order faxed to [facility] they will reach out to the patient to schedule. Phone number [phone] fax # [phone].

## 2019-06-04 ENCOUNTER — Other Ambulatory Visit: Payer: Self-pay

## 2019-06-04 ENCOUNTER — Encounter: Payer: Self-pay | Admitting: Obstetrics and Gynecology

## 2019-06-04 ENCOUNTER — Other Ambulatory Visit (HOSPITAL_COMMUNITY)
Admission: RE | Admit: 2019-06-04 | Discharge: 2019-06-04 | Disposition: A | Discharge status: Home / Self Care | Payer: 59 | Source: Ambulatory Visit | Attending: Obstetrics and Gynecology | Admitting: Obstetrics and Gynecology

## 2019-06-04 ENCOUNTER — Ambulatory Visit (INDEPENDENT_AMBULATORY_CARE_PROVIDER_SITE_OTHER): Payer: 59 | Admitting: Obstetrics and Gynecology

## 2019-06-04 VITALS — BP 116/70 | HR 60 | Temp 98.0°F | Resp 12 | Ht 64.75 in | Wt 256.0 lb

## 2019-06-04 DIAGNOSIS — K900 Celiac disease: Secondary | ICD-10-CM

## 2019-06-04 DIAGNOSIS — Z6841 Body Mass Index (BMI) 40.0 and over, adult: Secondary | ICD-10-CM | POA: Diagnosis not present

## 2019-06-04 DIAGNOSIS — Z01419 Encounter for gynecological examination (general) (routine) without abnormal findings: Secondary | ICD-10-CM

## 2019-06-04 NOTE — Patient Instructions (Signed)

## 2019-06-04 NOTE — Progress Notes (Signed)
63 y.o. G72P2002 Married Caucasian female here for annual exam.    No vaginal bleeding.  Some sweats during day.  States these are few and far between.  No night sweats.   Denies vaginal dryness or pain with intercourse.   Has an MRI this week for facial tingling and dizzy spells for months.  Has seen neurology for evaluation.   Taking Plaquenil for connective tissue disorder.  This treats her achyness.  Having issues with weight gain.  Adjusting her thyroid medication.   Also has irregular heart beat, so it is difficult to exercise.  She will cardiology June 1 at [facility].   PCP: Lonia Mad, MD    Patient's last menstrual period was 05/05/2016 (exact date).           Sexually active: Yes.    The current method of family planning is tubal ligation and postmenopausal.    Exercising: No.  The patient does not participate in regular exercise at present. Smoker:  no  Health Maintenance: Pap:  04/11/18 Neg:Neg HR HPV History of abnormal Pap:  no MMG:  09/03/18 BIRADS 1 negative/density b Colonoscopy:  03/2015 normal in [place];next due 03/2025.  Had an upper GI recently and waiting for results from this.  BMD:  02/2017  Result  Normal  TDaP:  2012 Gardasil:   n/a HIV and Hep C: 04/06/16 Negative Screening Labs: PCP   reports that she has never smoked. She has never used smokeless tobacco. She reports that she does not drink alcohol or use drugs.  Past Medical History:  Diagnosis Date  . Adult celiac disease 2018  . Anemia   . Anxiety   . Arrhythmia   . Cataract cortical, senile, bilateral   . Celiac disease   . Dysmenorrhea   . Heart murmur   . Lupus ([place])   . Sleep apnea   . Thyroid disease    hypothyroidism  . TIA (transient ischemic attack)    2 per patient  . Urinary incontinence    with sneezing, coughing    Past Surgical History:  Procedure Laterality Date  . CATARACT EXTRACTION Right 03/2018  . CATARACT EXTRACTION Left 03/2018  . CHOLECYSTECTOMY    .  TUBAL LIGATION      Current Outpatient Medications  Medication Sig Dispense Refill  . albuterol (PROAIR HFA) 108 (90 BASE) MCG/ACT inhaler Inhale 1 puff into the lungs as needed.    Marland Kitchen b complex vitamins tablet Take by mouth daily.    . folic acid (FOLVITE) 1 MG tablet Take 1 mg by mouth. 2 tablets daily    . hydroxychloroquine (PLAQUENIL) 200 MG tablet Take 400 mg by mouth 2 (two) times daily.     . Iron-Vitamin C 65-125 MG TABS Take by mouth.    . latanoprost (XALATAN) 0.005 % ophthalmic solution Place 1 drop into both eyes daily.     Marland Kitchen levothyroxine (SYNTHROID, LEVOTHROID) 150 MCG tablet Take 1 tablet by mouth daily.  3  . magnesium oxide (MAG-OX) 400 MG tablet Take 1 tablet by mouth daily.     . meclizine (ANTIVERT) 25 MG tablet Take 1 tablet by mouth as needed.    . metoprolol tartrate (LOPRESSOR) 25 MG tablet Take 25 mg by mouth 2 (two) times daily. 1.5 tablets twice daily    . VITAMIN D PO Take 50,000 Units by mouth 3 (three) times a week.     No current facility-administered medications for this visit.     Family History  Problem Relation  Age of Onset  . Heart attack Father        dec age 70  . Hypertension Father   . Hypertension Mother   . Hyperlipidemia Mother   . Migraines Mother   . Seizures Mother        epilepsy  . Stroke Mother        TIA's  . Thyroid disease Sister        hypothyroid  . Thyroid disease Sister        hypothyroid  . Rheum arthritis Maternal Grandmother   . Migraines Maternal Grandmother   . Cancer Maternal Grandfather 69       colon ca--DEC age 92  . Diabetes Maternal Grandfather   . Stroke Paternal Grandmother        multiple  . Thyroid disease Paternal Grandmother        goiter-hypothyroid  . Breast cancer Maternal Aunt     Review of Systems  Constitutional: Negative.   HENT: Negative.   Eyes: Negative.   Respiratory: Negative.   Cardiovascular: Negative.   Gastrointestinal: Negative.   Endocrine: Negative.   Genitourinary:  Negative.   Musculoskeletal: Negative.   Skin: Negative.   Allergic/Immunologic: Negative.   Neurological: Negative.   Hematological: Negative.   Psychiatric/Behavioral: Negative.     Exam:   BP 116/70 (BP Location: Right Arm, Patient Position: Sitting, Cuff Size: Large)   Pulse 60   Temp 98 F (36.7 C) (Temporal)   Resp 12   Ht 5' 4.75" (1.645 m)   Wt 256 lb (116.1 kg)   LMP 05/05/2016 (Exact Date)   BMI 42.93 kg/m     General appearance: alert, cooperative and appears stated age Head: normocephalic, without obvious abnormality, atraumatic Neck: no adenopathy, supple, symmetrical, trachea midline and thyroid normal to inspection and palpation Lungs: clear to auscultation bilaterally Breasts: right - normal appearance, no masses or tenderness, No nipple retraction or dimpling, No nipple discharge or bleeding, No axillary adenopathy Left - nipple inversion (old change), no masses or tenderness, dimpling, No nipple discharge or bleeding, No axillary adenopathy Heart: regular rate and rhythm Abdomen: soft, non-tender; no masses, no organomegaly Extremities: extremities normal, atraumatic, no cyanosis or edema Skin: skin color, texture, turgor normal. No rashes or lesions Lymph nodes: cervical, supraclavicular, and axillary nodes normal. Neurologic: grossly normal  Pelvic: External genitalia:  no lesions              No abnormal inguinal nodes palpated.              Urethra:  normal appearing urethra with no masses, tenderness or lesions              Bartholins and Skenes: normal                 Vagina: normal appearing vagina with normal color and discharge, no lesions.  Small right sided vaginal 4 mm cyst visible.  Not palpable.              Cervix: no lesions              Pap taken: Yes.   Bimanual Exam:  Uterus:  normal size, contour, position, consistency, mobility, non-tender              Adnexa: no mass, fullness, tenderness              Rectal exam: Yes.    Confirms.  Anus:  normal sphincter tone, no lesions  Chaperone was present for exam.  Assessment:   Well woman visit with normal exam. Postmenopausal female. BMI 42.9 On Plaquenil.  Connective tissue disorder. Hx iron deficiency anemia.  Celiac disease. Menopausal symptoms. Started menopause late. Hx TIA.  Hypothyroidism. Left nipple inversion.   Plan: Mammogram screening discussed. Self breast awareness reviewed. Pap and HR HPV as above. Guidelines for Calcium, Vitamin D, regular exercise program including cardiovascular and weight bearing exercise. Referral to nutrition management.  We talked a lot about meal planning today.    Follow up annually and prn.   After visit summary provided.

## 2019-06-06 LAB — CYTOLOGY - PAP
Diagnosis: NEGATIVE
HPV: NOT DETECTED

## 2019-06-06 NOTE — Telephone Encounter (Signed)
Pending faxed notes.

## 2019-06-06 NOTE — Telephone Encounter (Signed)
Corrina with [facility] called me and stated that their MRI tech stated per their protocol the MRI needs to be switch to MRI Brain w/wo contrast attn: trigeminal. When you get a chance can you put a new order in.

## 2019-06-10 NOTE — Telephone Encounter (Signed)
Maria Meza: [phone] (exp. 06/07/19 to 12/04/19)order faxed to [facility].

## 2019-06-10 NOTE — Telephone Encounter (Signed)
I faxed the order attn to [place].they will reach out to the patient to schedule.

## 2019-06-12 NOTE — Telephone Encounter (Signed)
Patient is scheduled for 06/13/19.

## 2019-06-13 ENCOUNTER — Encounter: Payer: Self-pay | Admitting: Neurology

## 2019-06-27 ENCOUNTER — Telehealth: Payer: Self-pay | Admitting: *Deleted

## 2019-06-27 ENCOUNTER — Telehealth: Payer: Self-pay | Admitting: Neurology

## 2019-06-27 NOTE — Telephone Encounter (Signed)
She had MRI brain at [place] [facility] in [place], [place].     The report shows nonspecific WM changes, unchanged compared to previous.   Vascular flow voids are grossly intact.   Skull base through CN V shows no lesions.

## 2019-06-27 NOTE — Telephone Encounter (Signed)
-----   Message from Britt Bottom, MD sent at 06/27/2019  2:23 PM EDT ----- Regarding: MRI Please let the patient know that the MRI showed no changes compared to her previous one (age related changes).  Nothing was pushing on the nerves going to the face

## 2019-07-02 ENCOUNTER — Ambulatory Visit: Payer: 59 | Admitting: Registered"

## 2019-07-03 ENCOUNTER — Telehealth: Payer: Self-pay | Admitting: *Deleted

## 2019-07-03 NOTE — Telephone Encounter (Signed)
R/c pt cd from [facility]. Pt cd on [employer].

## 2019-07-11 ENCOUNTER — Ambulatory Visit: Payer: 59 | Admitting: Registered"

## 2019-08-01 ENCOUNTER — Encounter: Payer: 59 | Attending: Obstetrics and Gynecology | Admitting: Registered"

## 2019-08-01 ENCOUNTER — Encounter: Payer: Self-pay | Admitting: Registered"

## 2019-08-01 ENCOUNTER — Other Ambulatory Visit: Payer: Self-pay

## 2019-08-01 DIAGNOSIS — K900 Celiac disease: Secondary | ICD-10-CM | POA: Diagnosis not present

## 2019-08-01 NOTE — Patient Instructions (Addendum)
Instructions/Goals:   Continue including 3-5 small meals/snacks. Include balanced meals like the plate method while avoiding foods containing gluten  Continue diligently checking labels to ensure foods do not include gluten (See handouts)  Recommend trying lactose free milk in place of regular to see if GI distress improves  Recommend keeping a food and symptom journal and including any environmental stressors as well to help identify possible associations. Can take pictures of foods if writing them down is difficult.

## 2019-08-01 NOTE — Progress Notes (Signed)
Medical Nutrition Therapy:  Appt start time: 1400 end time:  1500.  Assessment:  Primary concerns today: Pt referred due to celiac disease and weight management. Pt present for appointment alone. 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 Reports work has been stressful during [place]. Pt reports having trouble with ongoing weight gain. Pt has been dx with hypothyroidism, celiac disease, and reports she thinks she may have lupus. She reports sometimes having low blood pressure and having arrhythmia.   Pt reports that it is difficult working to eat well while considering her different health conditions. Reports having some GI problems after drinking milk first thing in the morning. Reports cheese and yogurt are well tolerated. Pt has cut out sodas which she reports has helped reduce bloating. Reports cruciferous vegetables sometimes cause GI problems. Reports high fat foods cause stomach upset as well. Pt reports that she tries to eat multiple small meals/snacks throughout the day rather than fewer large meals. Pt reports that writing down all that she eats is hard for her but she likes the idea of taking pictures of meals instead for logging.    Pt reports she was dx with celiac a couple years ago and since that dx she has been checking labels closely to avoid gluten. Reports she was dx with celiac by her rheumatologist and when she went to a gastroenterologist she was told her records from 9 years ago indicated celiac disease. Pt reports that she is not aware of any other family members having it.    Pt reports taking vitamin D, B complex vitamins, and folic acid per her PCP. She reports a hx of anemia and iron supplementation but that currently this is not a problem. Reports her iron is checked regularly.   Food Allergies/Intolerances: gluten; pt has celiac disease. Reports GI problems following ingestion of milk.   GI Concerns: Reports having diarrhea multiple times some days, may then  go days without having any issues.   Pertinent Lab Values: N/A  Weight Hx: See chart.   Preferred Learning Style:   No preference indicated   Learning Readiness:   Ready  MEDICATIONS: See list. Reviewed.    DIETARY INTAKE:  Usual eating pattern includes 3 meals and 2 snacks per day. Tries to eat several small meals/snacks. Tries not to eat after 7 PM at night.   Common foods: potatoes in various forms, variety of vegetables.  Avoided foods: gluten containing foods; fried foods; soft drinks.     Typical Snacks: nuts, fruits, yogurt.     Typical Beverages: at least 32 water; decaffeinated tea; coffee; orange juice.   Location of Meals: in front of the TV in the recliner.   Electronics Present at [employer]: Yes  24-hr recall:  B (630-7 AM): GF oatmeal, coffee (non-dairy creamer, sugar) Snk (915 AM): baby bell cheese, GF crackers, water L ( PM): leftover mashed potatoes, squash, water  Snk ( PM): Yoplait blueberry yogurt  D ( PM): chicken alfredo with GF rotini noodles, salad with honey mustard dressing watered down, sweetened tea  Snk ( PM): None reported.  Beverages: water; sweet tea; coffee   Usual physical activity: walking at a playground park near home Minutes/Week: 3 days x ~30 minutes for 2-3 weeks but has not been doing this routine recently since heat increased.   Progress Towards Goal(s):  In progress.   Nutritional Diagnosis:  NB-1.1 Food and nutrition-related knowledge deficit As related to balanced, gluten free diet .  As evidenced by pt  has questions regarding how to eat healthfully on a gluten free diet.    Intervention:  Nutrition counseling provided. Provided education regarding pathophysiology of celiac disease. Provided education on balanced, gluten free diet. Reviewed label reading for gluten free diet and to promote health and weight loss with hypothyroidism. Discussed lactose intolerance and that often times yogurt and cheese can still be tolerated  when milk cannot because milk has a higher lactose content. Recommended trying lactose free milk and discussed lactose free milk options that contain a good source of protein (nut milk +protein, pea milk). Recommended keeping a food and symptom log and including any environmental stressors as well. Pt appeared agreeable to information/goals discussed.   Instructions/Goals:   Continue including 3-5 small meals/snacks. Include balanced meals like the plate method while avoiding foods containing gluten  Continue diligently checking labels to ensure foods do not include gluten (See handouts)  Recommend trying lactose free milk in place of regular to see if GI distress improves  Recommend keeping a food and symptom journal and including any environmental stressors as well to help identify possible associations. Can take pictures of foods if writing them down is difficult.   Teaching Method Utilized:  Visual Auditory  Handouts given during visit include:  Nutrition Therapy for Celiac Disease   Tips for Gluten-Free Diet  Label Reading for Gluten-Free Diet   Barriers to learning/adherence to lifestyle change: None indicated.   Demonstrated degree of understanding via:  Teach Back   Monitoring/Evaluation:  Dietary intake, exercise, and body weight in 2 month(s).

## 2019-08-06 ENCOUNTER — Other Ambulatory Visit: Payer: Self-pay | Admitting: Obstetrics and Gynecology

## 2019-08-06 DIAGNOSIS — Z1231 Encounter for screening mammogram for malignant neoplasm of breast: Secondary | ICD-10-CM

## 2019-09-18 ENCOUNTER — Ambulatory Visit
Admission: RE | Admit: 2019-09-18 | Discharge: 2019-09-18 | Disposition: A | Discharge status: Home / Self Care | Payer: 59 | Source: Ambulatory Visit | Attending: Obstetrics and Gynecology | Admitting: Obstetrics and Gynecology

## 2019-09-18 ENCOUNTER — Other Ambulatory Visit: Payer: Self-pay

## 2019-09-18 DIAGNOSIS — Z1231 Encounter for screening mammogram for malignant neoplasm of breast: Secondary | ICD-10-CM

## 2019-11-07 ENCOUNTER — Ambulatory Visit: Payer: 59 | Admitting: Registered"

## 2020-02-19 DIAGNOSIS — Z8616 Personal history of COVID-19: Secondary | ICD-10-CM

## 2020-02-19 HISTORY — DX: Personal history of COVID-19: Z86.16

## 2020-02-24 HISTORY — PX: OTHER SURGICAL HISTORY: SHX169

## 2020-04-16 ENCOUNTER — Ambulatory Visit (INDEPENDENT_AMBULATORY_CARE_PROVIDER_SITE_OTHER): Payer: 59 | Admitting: Family Medicine

## 2020-04-16 ENCOUNTER — Other Ambulatory Visit: Payer: Self-pay

## 2020-04-16 ENCOUNTER — Encounter: Payer: Self-pay | Admitting: Family Medicine

## 2020-04-16 VITALS — BP 124/84 | HR 58 | Temp 97.2°F | Ht 65.0 in | Wt 250.0 lb

## 2020-04-16 DIAGNOSIS — L299 Pruritus, unspecified: Secondary | ICD-10-CM | POA: Diagnosis not present

## 2020-04-16 DIAGNOSIS — R200 Anesthesia of skin: Secondary | ICD-10-CM | POA: Diagnosis not present

## 2020-04-16 DIAGNOSIS — R42 Dizziness and giddiness: Secondary | ICD-10-CM

## 2020-04-16 NOTE — Progress Notes (Signed)
I have read the note, and I agree with the clinical assessment and plan.  Alsha Meland A. Arron Tetrault, MD, PhD, FAAN Certified in Neurology, Clinical Neurophysiology, Sleep Medicine, Pain Medicine and Neuroimaging  [facility] [address], [address] [place], [place] [address] [phone]  

## 2020-04-16 NOTE — Progress Notes (Signed)
PATIENT: Maria Meza DOB: 15-Feb-1956  REASON FOR VISIT: follow up HISTORY FROM: patient  Chief Complaint  Patient presents with  . Follow-up    numbness, rm 5, alone, Pt reports "dizzy spells" worse  at night     HISTORY OF PRESENT ILLNESS: Today 04/16/20 Maria Meza is a 64 y.o. female here today for follow up for right facial numbness and dizziness. MRI was unremarkable. She continues to have dizziness on a daily basis. She feels it is worse at night. She has also noted worsening with turning her head. Sometimes it just comes on with no correlating event. She feels that her heart starts skipping beats (known PVC's) after a dizzy spell. Meclizine seemed to help in the beginning but doesn't help as much now. She does mention that her head feels full post COVID diagnosed 02/20/2020. She feels that her sinus are full. She feels that sound is muffled. She has had significant itching and dry skin of both ears. No other infectious symptoms.   She also feels that chin is still numb and tingling. No pain. She notices it most when she washes her face.   She is working closely with PCP and rheumatology. She does have chronic neck pain. She has been treated by PT in the past with some relief. She is working full time.   HISTORY: (copied from Dr Garth Bigness note on 05/27/2019)  I had the pleasure of seeing your patient, Maria Meza, at [facility] for neurologic consultation regarding her various symptoms including lightheadedness and facial numbness  Since 2008, she has had episodes of dizziness and lightheadedness lasting 2-3 minutes.  These spells are not positional and orthostatic vital sings have been normal in the past.  They are more lightheadedness than vertigo but she has had a few episodes of vertigo lasting seconds the last couple weeks.   Sometimes she has palpitations.   She has had several Holter monitors.   She has symptomatic PVC's and sees [name] [facility]  Cardiology).  She has had loop recorder as well.  She is on metoprolol and previously had been tried on verapamil and amiodarone but had poor tolerability.      She reports numbness in the right chin for the past few months.   The numbness is only on the right and respects the midline.  There is no associated pain.       Other symptoms have only occurred rarely but bother her. She has had episodes of slurred speech or blurred vision.  A few years ago, she had one episode where the left eyelid would not open up for many minutes.  She has had a few episodes of where she feels she needs to consciously breathe or consciously eat.   She notes numbness in the face.     She was told she had a mini-stroke on an MRI 10+ years ago and saw a neurologist.   No medications were started.   She had another MRI last year and was told no new strokes.   She reports, a doppler study did not show significant findings   She [facility] 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 .  ANA, double-stranded DNA and TTG were positive.  She has arthralgias, fatigue.  She has been on Plaquenil with benefit.   She has CPAP due to nocturnal hypoxia and CPAP.     REVIEW OF SYSTEMS: Out of a complete 14 system review of symptoms, the patient complains only of  the following symptoms, and all other reviewed systems are negative.  ALLERGIES: Allergies  Allergen Reactions  . Gluten Meal     Pt has celiac disease.   Marland Kitchen Hydrocodone Nausea And Vomiting  . Pacerone [Amiodarone] Other (See Comments)    "does not tolerate"--slows heart rate  . Propafenone Hcl     Caused very low HR    HOME MEDICATIONS: Outpatient Medications Prior to Visit  Medication Sig Dispense Refill  . albuterol (PROAIR HFA) 108 (90 BASE) MCG/ACT inhaler Inhale 1 puff into the lungs as needed.    Marland Kitchen b complex vitamins tablet Take by mouth daily.    . folic acid (FOLVITE) 1 MG tablet Take 1 mg by mouth. 2 tablets daily    .  hydroxychloroquine (PLAQUENIL) 200 MG tablet Take 400 mg by mouth 2 (two) times daily.     . Iron-Vitamin C 65-125 MG TABS Take by mouth.    . latanoprost (XALATAN) 0.005 % ophthalmic solution Place 1 drop into both eyes daily.     Marland Kitchen levothyroxine (SYNTHROID, LEVOTHROID) 150 MCG tablet Take 1 tablet by mouth daily.  3  . magnesium oxide (MAG-OX) 400 MG tablet Take 1 tablet by mouth daily.     . meclizine (ANTIVERT) 25 MG tablet Take 1 tablet by mouth as needed.    . metoprolol tartrate (LOPRESSOR) 25 MG tablet Take 25 mg by mouth 2 (two) times daily. 1.5 tablets twice daily    . VITAMIN D PO Take 50,000 Units by mouth 3 (three) times a week.     No facility-administered medications prior to visit.    PAST MEDICAL HISTORY: Past Medical History:  Diagnosis Date  . Adult celiac disease 2018  . Anemia   . Anxiety   . Arrhythmia   . Cataract cortical, senile, bilateral   . Celiac disease   . Dysmenorrhea   . Heart murmur   . Lupus ([place])   . Sleep apnea   . Thyroid disease    hypothyroidism  . TIA (transient ischemic attack)    2 per patient  . Urinary incontinence    with sneezing, coughing    PAST SURGICAL HISTORY: Past Surgical History:  Procedure Laterality Date  . CATARACT EXTRACTION Right 03/2018  . CATARACT EXTRACTION Left 03/2018  . CHOLECYSTECTOMY    . TUBAL LIGATION      FAMILY HISTORY: Family History  Problem Relation Age of Onset  . Heart attack Father        dec age 24  . Hypertension Father   . Hypertension Mother   . Hyperlipidemia Mother   . Migraines Mother   . Seizures Mother        epilepsy  . Stroke Mother        TIA's  . Thyroid disease Sister        hypothyroid  . Thyroid disease Sister        hypothyroid  . Rheum arthritis Maternal Grandmother   . Migraines Maternal Grandmother   . Cancer Maternal Grandfather 6       colon ca--DEC age 63  . Diabetes Maternal Grandfather   . Stroke Paternal Grandmother        multiple  . Thyroid  disease Paternal Grandmother        goiter-hypothyroid  . Breast cancer Maternal Aunt     SOCIAL HISTORY: Social History   Socioeconomic History  . Marital status: Married    Spouse name: Not on file  . Number of children: Not on  file  . Years of education: Not on file  . Highest education level: Not on file  Occupational History  . Not on file  Tobacco Use  . Smoking status: Never Smoker  . Smokeless tobacco: Never Used  Substance and Sexual Activity  . Alcohol use: No    Alcohol/week: 0.0 standard drinks  . Drug use: No  . Sexual activity: Yes    Partners: Male    Birth control/protection: None, Post-menopausal  Other Topics Concern  . Not on file  Social History Narrative   Right handed    Caffeine use: 1 cup coffee every morning   Social Determinants of Health   Financial Resource Strain:   . Difficulty of Paying Living Expenses:   Food Insecurity:   . Worried About Charity fundraiser in the Last Year:   . Arboriculturist in the Last Year:   Transportation Needs:   . Film/video editor (Medical):   Marland Kitchen Lack of Transportation (Non-Medical):   Physical Activity:   . Days of Exercise per Week:   . Minutes of Exercise per Session:   Stress:   . Feeling of Stress :   Social Connections:   . Frequency of Communication with Friends and Family:   . Frequency of Social Gatherings with Friends and Family:   . Attends Religious Services:   . Active Member of Clubs or Organizations:   . Attends Archivist Meetings:   Marland Kitchen Marital Status:   Intimate Partner Violence:   . Fear of Current or Ex-Partner:   . Emotionally Abused:   Marland Kitchen Physically Abused:   . Sexually Abused:       PHYSICAL EXAM  Vitals:   04/16/20 0826  BP: 124/84  Pulse: (!) 58  Temp: (!) 97.2 F (36.2 C)  Weight: 250 lb (113.4 kg)  Height: 5' 5"  (1.651 m)   Body mass index is 41.6 kg/m.  Generalized: Well developed, in no acute distress  Cardiology: normal rate and rhythm, no  murmur noted Respiratory: clear to auscultation bilaterally  Neurological examination  Mentation: Alert oriented to time, place, history taking. Follows all commands speech and language fluent Cranial nerve II-XII: Pupils were equal round reactive to light. Extraocular movements were full, visual field were full on confrontational test. Facial sensation and strength were normal. Head turning and shoulder shrug  were normal and symmetric. No nystagmus.  Motor: The motor testing reveals 5 over 5 strength of all 4 extremities. Good symmetric motor tone is noted throughout.  Sensory: Sensory testing is intact to soft touch on all 4 extremities. No evidence of extinction is noted.  Gait and station: Gait is normal.  Reflexes: Deep tendon reflexes are symmetric and normal bilaterally.   ENT: left ear canal with flaky skin, canal is very narrow, no obvious infection or drainage but I am unable to visualize most of TM. I am uncertain if this is her anatomy or potential edema. No erythema. Right ear with cerumen present, TM intact  DIAGNOSTIC DATA (LABS, IMAGING, TESTING) - I reviewed patient records, labs, notes, testing and imaging myself where available.  No flowsheet data found.   Lab Results  Component Value Date   WBC 7.5 04/07/2017   HGB 13.0 04/07/2017   HCT 39.4 04/07/2017   MCV 91.0 04/07/2017   PLT 367 04/07/2017   No results found for: NA, K, CL, CO2, GLUCOSE, BUN, CREATININE, CALCIUM, PROT, ALBUMIN, AST, ALT, ALKPHOS, BILITOT, GFRNONAA, GFRAA No results found for: CHOL, HDL,  LDLCALC, LDLDIRECT, TRIG, CHOLHDL No results found for: HGBA1C No results found for: VITAMINB12 No results found for: TSH     ASSESSMENT AND PLAN 64 y.o. year old female  has a past medical history of Adult celiac disease (2018), Anemia, Anxiety, Arrhythmia, Cataract cortical, senile, bilateral, Celiac disease, Dysmenorrhea, Heart murmur, Lupus ([place]), Sleep apnea, Thyroid disease, TIA (transient ischemic  attack), and Urinary incontinence. here with     ICD-10-CM   1. Right facial numbness  R20.0   2. Episodic lightheadedness  R42   3. Itching of ear  L29.9     [name] continues to have dizziness and right facial numbness. She has been able to correlate dizziness with turning her head but symptoms can also occur randomly with no obvious cause. Meclizine has helped in the past. She endorses sinus congestion, muffled hearing, bilateral ear itching and left canal is very narrow on exam today. We will try Mucinex ER OTC every 12 hours. She will continue to drink 50-60 ounces of water daily. We will consider vestibular therapy if symptoms continue. She was educated on how to perform Epley maneuver. She will follow up closely with PCP as well. May need to see dermatology. She will work on healthy lifestyle habits for stroke prevention as discussed. She will follow up with [place] as needed. She verbalizes understanding and agreement with this plan.    No orders of the defined types were placed in this encounter.    No orders of the defined types were placed in this encounter.     I spent 15 minutes with the patient. 50% of this time was spent counseling and educating patient on plan of care and medications.    Debbora Presto, FNP-C 04/16/2020, 9:18 AM [facility] [address] [place], [place] [address] [phone]

## 2020-04-16 NOTE — Patient Instructions (Signed)
Let's try Mucinex (plain) ER over the counter twice daily for two weeks. Make sure to drink plenty of water.   We will consider vestibular therapy if needed  Please see primary care for ear itching and swelling   Follow up with [place] as needed   Vertigo Vertigo is the feeling that you or the things around you are moving when they are not. This feeling can come and go at any time. Vertigo often goes away on its own. This condition can be dangerous if it happens when you are doing activities like driving or working with machines. Your doctor will do tests to find the cause of your vertigo. These tests will also help your doctor decide on the best treatment for you. Follow these instructions at home: Eating and drinking      Drink enough fluid to keep your pee (urine) pale yellow.  Do not drink alcohol. Activity  Return to your normal activities as told by your doctor. Ask your doctor what activities are safe for you.  In the morning, first sit up on the side of the bed. When you feel okay, stand slowly while you hold onto something until you know that your balance is fine.  Move slowly. Avoid sudden body or head movements or certain positions, as told by your doctor.  Use a cane if you have trouble standing or walking.  Sit down right away if you feel dizzy.  Avoid doing any tasks or activities that can cause danger to you or others if you get dizzy.  Avoid bending down if you feel dizzy. Place items in your home so that they are easy for you to reach without leaning over.  Do not drive or use heavy machinery if you feel dizzy. General instructions  Take over-the-counter and prescription medicines only as told by your doctor.  Keep all follow-up visits as told by your doctor. This is important. Contact a doctor if:  Your medicine does not help your vertigo.  You have a fever.  Your problems get worse or you have new symptoms.  Your family or friends see changes in  your behavior.  The feeling of being sick to your stomach gets worse.  Your vomiting gets worse.  You lose feeling (have numbness) in part of your body.  You feel prickling and tingling in a part of your body. Get help right away if:  You have trouble moving or talking.  You are always dizzy.  You pass out (faint).  You get very bad headaches.  You feel weak in your hands, arms, or legs.  You have changes in your hearing.  You have changes in how you see (vision).  You get a stiff neck.  Bright light starts to bother you. Summary  Vertigo is the feeling that you or the things around you are moving when they are not.  Your doctor will do tests to find the cause of your vertigo.  You may be told to avoid some tasks, positions, or movements.  Contact a doctor if your medicine is not helping, or if you have a fever, new symptoms, or a change in behavior.  Get help right away if you get very bad headaches, or if you have changes in how you speak, hear, or see. This information is not intended to replace advice given to you by your health care provider. Make sure you discuss any questions you have with your health care provider. Document Revised: 11/05/2018 Document Reviewed: 11/05/2018  Elsevier Patient Education  [employer].   How to Perform the Epley Maneuver The Epley maneuver is an exercise that relieves symptoms of vertigo. Vertigo is the feeling that you or your surroundings are moving when they are not. When you feel vertigo, you may feel like the room is spinning and have trouble walking. Dizziness is a little different than vertigo. When you are dizzy, you may feel unsteady or light-headed. You can do this maneuver at home whenever you have symptoms of vertigo. You can do it up to 3 times a day until your symptoms go away. Even though the Epley maneuver may relieve your vertigo for a few weeks, it is possible that your symptoms will return. This maneuver  relieves vertigo, but it does not relieve dizziness. What are the risks? If it is done correctly, the Epley maneuver is considered safe. Sometimes it can lead to dizziness or nausea that goes away after a short time. If you develop other symptoms, such as changes in vision, weakness, or numbness, stop doing the maneuver and call your health care provider. How to perform the Epley maneuver 1. Sit on the edge of a bed or table with your back straight and your legs extended or hanging over the edge of the bed or table. 2. Turn your head halfway toward the affected ear or side. 3. Lie backward quickly with your head turned until you are lying flat on your back. You may want to position a pillow under your shoulders. 4. Hold this position for 30 seconds. You may experience an attack of vertigo. This is normal. 5. Turn your head to the opposite direction until your unaffected ear is facing the floor. 6. Hold this position for 30 seconds. You may experience an attack of vertigo. This is normal. Hold this position until the vertigo stops. 7. Turn your whole body to the same side as your head. Hold for another 30 seconds. 8. Sit back up. You can repeat this exercise up to 3 times a day. Follow these instructions at home:  After doing the Epley maneuver, you can return to your normal activities.  Ask your health care provider if there is anything you should do at home to prevent vertigo. He or she may recommend that you: ? Keep your head raised (elevated) with two or more pillows while you sleep. ? Do not sleep on the side of your affected ear. ? Get up slowly from bed. ? Avoid sudden movements during the day. ? Avoid extreme head movement, like looking up or bending over. Contact a health care provider if:  Your vertigo gets worse.  You have other symptoms, including: ? Nausea. ? Vomiting. ? Headache. Get help right away if:  You have vision changes.  You have a severe or worsening headache  or neck pain.  You cannot stop vomiting.  You have new numbness or weakness in any part of your body. Summary  Vertigo is the feeling that you or your surroundings are moving when they are not.  The Epley maneuver is an exercise that relieves symptoms of vertigo.  If the Epley maneuver is done correctly, it is considered safe. You can do it up to 3 times a day. This information is not intended to replace advice given to you by your health care provider. Make sure you discuss any questions you have with your health care provider. Document Revised: 11/24/2017 Document Reviewed: 11/01/2016 Elsevier Patient Education  2020 [employer].

## 2020-05-28 HISTORY — PX: OTHER SURGICAL HISTORY: SHX169

## 2020-06-04 NOTE — Progress Notes (Signed)
64 y.o. G31P2002 Married Caucasian female here for annual exam.    Denies vaginal bleeding.   Patient had COVID 19 01/2020. Had cardiac arrhythmia and had Holter monitoring. Some fatigue.   Episode of sciatica.   Got MRSA.  Retired in April.  Husband has a lump in his neck being evaluated.   PCP: Lonia Mad, MD  Patient's last menstrual period was 05/05/2016 (exact date).           Sexually active: Yes.    The current method of family planning is tubal ligation.    Exercising: No.  The patient does not participate in regular exercise at present. Smoker:  no  Health Maintenance: Pap:  06-04-19 Neg:Neg HR HPV, 04/11/18 Neg:Neg HR HPV, 04-08-27 Neg:Neg HR HPV History of abnormal Pap:  no MMG: 09-18-19 3D/Neg/density B/BiRads1 Colonoscopy: 03/2015 normal in [place];next due 03/2025 BMD: 02/2017  Result :Normal TDaP:  2012 Gardasil:   no HIV:04-06-16 NR Hep C:04-06-16 Neg Screening Labs:  PCP   reports that she has never smoked. She has never used smokeless tobacco. She reports that she does not drink alcohol and does not use drugs.  Past Medical History:  Diagnosis Date  . Adult celiac disease 2018  . Anemia   . Anxiety   . Arrhythmia   . Cataract cortical, senile, bilateral   . Celiac disease   . Dysmenorrhea   . Heart murmur   . History of COVID-19 02/19/2020  . Lupus ([place])   . MRSA infection 4-end-21   on abdomen  . Sleep apnea   . Thyroid disease    hypothyroidism  . TIA (transient ischemic attack)    2 per patient  . Urinary incontinence    with sneezing, coughing    Past Surgical History:  Procedure Laterality Date  . CATARACT EXTRACTION Right 03/2018  . CATARACT EXTRACTION Left 03/2018  . CHOLECYSTECTOMY    . TUBAL LIGATION      Current Outpatient Medications  Medication Sig Dispense Refill  . albuterol (PROAIR HFA) 108 (90 BASE) MCG/ACT inhaler Inhale 1 puff into the lungs as needed.    . Ascorbic Acid (VITAMIN C) 1000 MG tablet Take 1,000 mg  by mouth daily.    Marland Kitchen b complex vitamins tablet Take by mouth daily.    . folic acid (FOLVITE) 1 MG tablet Take 1 mg by mouth. 2 tablets daily    . hydroxychloroquine (PLAQUENIL) 200 MG tablet Take 400 mg by mouth 2 (two) times daily.     Marland Kitchen latanoprost (XALATAN) 0.005 % ophthalmic solution Place 1 drop into both eyes daily.     Marland Kitchen levothyroxine (SYNTHROID, LEVOTHROID) 150 MCG tablet Take 1 tablet by mouth daily.  3  . magnesium oxide (MAG-OX) 400 MG tablet Take 1 tablet by mouth daily.     . meclizine (ANTIVERT) 25 MG tablet Take 1 tablet by mouth as needed.    . metoprolol tartrate (LOPRESSOR) 25 MG tablet Take 25 mg by mouth 2 (two) times daily. 1.5 tablets twice daily    . omeprazole (PRILOSEC) 40 MG capsule Take 1 capsule by mouth 2 (two) times daily.     No current facility-administered medications for this visit.    Family History  Problem Relation Age of Onset  . Heart attack Father        dec age 50  . Hypertension Father   . Hypertension Mother   . Hyperlipidemia Mother   . Migraines Mother   . Seizures Mother  epilepsy  . Stroke Mother        TIA's  . Thyroid disease Sister        hypothyroid  . Thyroid disease Sister        hypothyroid  . Rheum arthritis Maternal Grandmother   . Migraines Maternal Grandmother   . Cancer Maternal Grandfather 62       colon ca--DEC age 38  . Diabetes Maternal Grandfather   . Stroke Paternal Grandmother        multiple  . Thyroid disease Paternal Grandmother        goiter-hypothyroid  . Breast cancer Maternal Aunt     Review of Systems  All other systems reviewed and are negative.   Exam:   BP 130/72 (Cuff Size: Large)   Pulse 60   Temp 97.6 F (36.4 C) (Temporal)   Resp 14   Ht 5' 5.25" (1.657 m)   Wt 251 lb (113.9 kg)   LMP 05/05/2016 (Exact Date)   BMI 41.45 kg/m     General appearance: alert, cooperative and appears stated age Head: normocephalic, without obvious abnormality, atraumatic Neck: no adenopathy,  supple, symmetrical, trachea midline and thyroid normal to inspection and palpation Lungs: clear to auscultation bilaterally Breasts: normal appearance, no masses or tenderness, No nipple retraction or dimpling on right, has nipple retraction on left, No nipple discharge or bleeding, No axillary adenopathy Heart: regular rate and rhythm Abdomen: soft, non-tender; no masses, no organomegaly Extremities: extremities normal, atraumatic, no cyanosis or edema Skin: skin color, texture, turgor normal. No rashes or lesions Lymph nodes: cervical, supraclavicular, and axillary nodes normal. Neurologic: grossly normal  Pelvic: External genitalia:  no lesions              No abnormal inguinal nodes palpated.              Urethra:  normal appearing urethra with no masses, tenderness or lesions              Bartholins and Skenes: normal                 Vagina: normal appearing vagina with normal color and discharge, no lesions              Cervix: no lesions              Pap taken: No. Bimanual Exam:  Uterus:  normal size, contour, position, consistency, mobility, non-tender              Adnexa: no mass, fullness, tenderness              Rectal exam: Yes.  .  Confirms.              Anus:  normal sphincter tone, no lesions  Chaperone was present for exam.  Assessment:   Well woman visit with normal exam. Postmenopausal female. BMI 42.9 On Plaquenil. Connective tissue disorder. Hx iron deficiency anemia. Celiac disease. Started menopause late. Hx TIA.  Hypothyroidism. Left nipple inversion.   Plan: Mammogram screening discussed. Self breast awareness reviewed. Pap and HR HPV as above. Guidelines for Calcium, Vitamin D, regular exercise program including cardiovascular and weight bearing exercise. Labs with PCP. Follow up annually and prn.   After visit summary provided.

## 2020-06-05 ENCOUNTER — Other Ambulatory Visit: Payer: Self-pay

## 2020-06-05 ENCOUNTER — Ambulatory Visit: Payer: 59 | Admitting: Obstetrics and Gynecology

## 2020-06-08 ENCOUNTER — Other Ambulatory Visit: Payer: Self-pay

## 2020-06-08 ENCOUNTER — Ambulatory Visit (INDEPENDENT_AMBULATORY_CARE_PROVIDER_SITE_OTHER): Payer: Medicare Other | Admitting: Obstetrics and Gynecology

## 2020-06-08 ENCOUNTER — Encounter: Payer: Self-pay | Admitting: Obstetrics and Gynecology

## 2020-06-08 VITALS — BP 130/72 | HR 60 | Temp 97.6°F | Resp 14 | Ht 65.25 in | Wt 251.0 lb

## 2020-06-08 DIAGNOSIS — Z01419 Encounter for gynecological examination (general) (routine) without abnormal findings: Secondary | ICD-10-CM

## 2020-06-08 DIAGNOSIS — Z124 Encounter for screening for malignant neoplasm of cervix: Secondary | ICD-10-CM | POA: Diagnosis not present

## 2020-06-08 NOTE — Patient Instructions (Signed)

## 2020-08-17 ENCOUNTER — Other Ambulatory Visit: Payer: Self-pay | Admitting: Obstetrics and Gynecology

## 2020-08-17 DIAGNOSIS — Z1231 Encounter for screening mammogram for malignant neoplasm of breast: Secondary | ICD-10-CM

## 2020-09-21 ENCOUNTER — Ambulatory Visit
Admission: RE | Admit: 2020-09-21 | Discharge: 2020-09-21 | Disposition: A | Discharge status: Home / Self Care | Payer: Medicare Other | Source: Ambulatory Visit | Attending: Obstetrics and Gynecology | Admitting: Obstetrics and Gynecology

## 2020-09-21 ENCOUNTER — Other Ambulatory Visit: Payer: Self-pay

## 2020-09-21 DIAGNOSIS — Z1231 Encounter for screening mammogram for malignant neoplasm of breast: Secondary | ICD-10-CM

## 2020-09-25 HISTORY — DX: Morbid (severe) obesity due to excess calories: E66.01

## 2020-10-05 ENCOUNTER — Other Ambulatory Visit: Payer: Self-pay

## 2020-10-05 ENCOUNTER — Encounter: Payer: Self-pay | Admitting: Cardiology

## 2020-10-05 ENCOUNTER — Ambulatory Visit (INDEPENDENT_AMBULATORY_CARE_PROVIDER_SITE_OTHER): Payer: Medicare Other | Admitting: Cardiology

## 2020-10-05 VITALS — BP 140/76 | HR 51 | Ht 65.0 in | Wt 258.8 lb

## 2020-10-05 DIAGNOSIS — G4733 Obstructive sleep apnea (adult) (pediatric): Secondary | ICD-10-CM | POA: Diagnosis not present

## 2020-10-05 DIAGNOSIS — R42 Dizziness and giddiness: Secondary | ICD-10-CM

## 2020-10-05 DIAGNOSIS — I493 Ventricular premature depolarization: Secondary | ICD-10-CM

## 2020-10-05 DIAGNOSIS — Z9989 Dependence on other enabling machines and devices: Secondary | ICD-10-CM

## 2020-10-05 DIAGNOSIS — I471 Supraventricular tachycardia: Secondary | ICD-10-CM

## 2020-10-05 NOTE — Progress Notes (Signed)
Primary Care Provider: Lonia Mad, MD Cardiologist: No primary care provider on file. Electrophysiologist:  Dr. Suzan Nailer from [facility] (last visit April 14, 2020)  Clinic Note: Chief Complaint  Patient presents with  . New Patient (Initial Visit)    Transfer of cardiology from [facility]  . Palpitations    Symptoms got worse since her Covid infection   HPI:    [facility] is a 64 y.o. female with a PMH notable for PALPITATIONS with monitor findings notable for SYMPTOMATIC PVCS/PSVT/NSVT (on low-dose beta-blocker--has bradycardia with higher doses) below who is being seen today for transfer of cardiology care to [facility] at the request of [facility],* & [facility], MD.  [facility] was last seen on April 14, 2020 by Maria Gunning, PA at Dr. [facility] office for follow-up of palpitations.  She has subsequently decided to transfer her care to [facility] (partly because of her husband recently been established with me) --> had severe syncopal episodes back in April (PCP ordered Zio patch)  Did consider the possibility of EPS with VT/PVC ablation.  In the past, had been started on verapamil for the wide-complex tachycardia and PVCs, she did not tolerate.She also did not tolerate amiodarone or propafenone.  Cardiac MRI ordered.  Noted that there is a low burden of PVCs.  No recurrent SVT spells.  Recent Hospitalizations: None   Had Covid in February ->had lots of GI symptoms as well as symptoms associated with URI. -->  The symptoms with exception of sense of smell have all resolved.  However palpitations worsen.  Reviewed  CV studies:    The following studies were reviewed today: (if available, images/films reviewed: From Epic Chart or Care Everywhere) . Nuclear stress test June 2015: EF 65%.  Fixed anteroseptal defect consistent with artifact. . Event Monitor [facility]) 07/18/2016: Noted PVCs, PSVT and NSVT (1 episode of 20 beats). ->  PVCs appear to have been  completely interpolated.  Felt to be septal . Echo [facility]) 08/2016 : Normal LV size and function-EF~55%.  No RWMA..  Normal LA pressures.  Normal RV function.  Trivial AR, PR and TR.  No stenoses.  [facility] patch from February 2021 -> not available . Cardiac MRI 05/28/2020: EF 69%.  No RWMA RV normal size and function.  (Does not meet requirement for ARVC).  Bilateral atrial enlargement.  Normal aortic valve.  No evidence of ischemia or infarction.  Interval History:   [facility] is here today to transfer care.  Unfortunately I do not have a Zio patch that was ordered by her PCP back earlier this April when she came in with near syncopal episode.  It the results were also not recorded in the visits from April 2021.  [facility] seems to have off-and-on episodes of palpitation or 4 beats but also a sense of feeling of fullness and tired sensation in her chest.  Maybe once or twice a week she noticed it now.  She has had some lightheaded dizzy spells but no near syncope since her last EP visit.  She said that ever since the Covid infection, her palpitations have gotten worse.  She says that the spells will feel short-lived usually only seconds but they can come and go off and on for about 6 minutes at a time.  Sometimes she feels her heart rate going fast but has not had edema to suggest prolonged SVT or VT.  Sometimes when the palpitations happen forcefully, she may feel some tightness or fullness in  her chest.  Otherwise she does not have any chest pain or pressure with exertion or rest without palpitations.  When the episodes are prolonged she gets lightheaded and presyncopal, but denies any true syncope.  Usually these episodes do not last long enough for her to actually feel fully syncopal..  CV Review of Symptoms (Summary): positive for - chest pain, dyspnea on exertion, irregular heartbeat, palpitations and TIA/amaurosis fugax, claudication negative for - edema, loss of consciousness, orthopnea,  paroxysmal nocturnal dyspnea, shortness of breath or TIA/amaurosis fugax  The patient does not have symptoms concerning for COVID-19 infection (fever, chills, cough, or new shortness of breath).   REVIEWED OF SYSTEMS   Review of Systems  Constitutional: Positive for malaise/fatigue (Does not have a lot of energy.). Negative for weight loss.  HENT: Negative for nosebleeds.   Respiratory: Negative for shortness of breath and wheezing.   Cardiovascular: Positive for palpitations. Negative for leg swelling.  Musculoskeletal: Positive for joint pain.  Neurological: Positive for dizziness (See HPI). Negative for speech change and focal weakness.  Psychiatric/Behavioral: Negative for depression and memory loss. The patient is nervous/anxious. The patient does not have insomnia.     I have reviewed and (if needed) personally updated the patient's problem list, medications, allergies, past medical and surgical history, social and family history.   PAST MEDICAL HISTORY   Past Medical History:  Diagnosis Date  . Absolute anemia    No longer on iron supplementation.  . Adult celiac disease 2018  . Anxiety   . Arrhythmia 06/2016   Event monitor has shown PVCs, PSVT at 1 short burst of NSVT.  Marland Kitchen Cataract cortical, senile, bilateral   . 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  (ANA positive, dsDNA negative)  . Dysmenorrhea   . History of COVID-19 02/19/2020  . IBS (irritable bowel syndrome)   . Morbid obesity with BMI of 40.0-44.9, adult ([place]) 09/2020   BMI 43.07  . MRSA infection 4-end-21   on abdomen  . OSA on CPAP   . Thyroid disease    hypothyroidism  . TIA (transient ischemic attack)    2 per patient  . Urinary incontinence    with sneezing, coughing    PAST SURGICAL HISTORY   Past Surgical History:  Procedure Laterality Date  . CARDIAC EVENT  MONITOR  06/2016   [facility]) Noted PVCs, PSVT and NSVT (1 episode of 20 beats). ->  PT evaluation suggested PVCs appear to have been completely interpolated.  Felt to be septal  . CARDIAC MRI  08/2016   [facility]): EF 69%.  No RWMA RV normal size and function.  (Does not meet requirement for ARVC).  Bilateral atrial enlargement.  Normal aortic valve.  No evidence of ischemia or infarction.  Marland Kitchen CATARACT EXTRACTION Right 03/2018  . CATARACT EXTRACTION Left 03/2018  . CHOLECYSTECTOMY    . LAPAROSCOPIC TUBAL LIGATION  1986  . NUCLEAR STRESS TEST  05/2014   EF 55%.  Mixed anteroseptal defect consistent with breast attenuation artifact.  . TRANSTHORACIC ECHOCARDIOGRAM  08/2016   [facility]- Normal LV size and function-EF~55%.  No RWMA..  Normal LA pressures.  Normal RV function.  Trivial AR, PR and TR.  No stenoses.    Laparoscopic tubal ligation-1986.   There is no immunization history on file for this patient.  MEDICATIONS/ALLERGIES   Current Meds  Medication Sig  . albuterol (PROAIR HFA)  108 (90 BASE) MCG/ACT inhaler Inhale 1 puff into the lungs as needed.  . Ascorbic Acid (VITAMIN C) 1000 MG tablet Take 1,000 mg by mouth daily.  Marland Kitchen b complex vitamins tablet Take by mouth daily.  . folic acid (FOLVITE) 1 MG tablet Take 1 mg by mouth. 2 tablets daily  . hydroxychloroquine (PLAQUENIL) 200 MG tablet Take 400 mg by mouth 2 (two) times daily.   Marland Kitchen latanoprost (XALATAN) 0.005 % ophthalmic solution Place 1 drop into both eyes daily.   Marland Kitchen levothyroxine (SYNTHROID, LEVOTHROID) 150 MCG tablet Take 1 tablet by mouth daily.  . magnesium oxide (MAG-OX) 400 MG tablet Take 1 tablet by mouth daily.   . meclizine (ANTIVERT) 25 MG tablet Take 1 tablet by mouth as needed.  . metoprolol tartrate (LOPRESSOR) 25 MG tablet Take 25 mg by mouth 2 (two) times daily. 1.5 tablets twice daily    Allergies  Allergen Reactions  . Gluten Meal     Pt has celiac disease.   Marland Kitchen Hydrocodone Nausea And Vomiting  . Pacerone [Amiodarone]  Other (See Comments)    "does not tolerate"--slows heart rate  . Propafenone Hcl     Caused very low HR  . Verapamil     Fatigue, constipation    SOCIAL HISTORY/FAMILY HISTORY   Reviewed in Epic:  Pertinent findings:  Social History   Tobacco Use  . Smoking status: Never Smoker  . Smokeless tobacco: Never Used  Vaping Use  . Vaping Use: Never used  Substance Use Topics  . Alcohol use: No    Alcohol/week: 0.0 standard drinks  . Drug use: No   Social History   Social History Narrative   She lives in [place], [place]   Right handed    Caffeine use: 1 cup coffee every morning   Family History  Problem Relation Age of Onset  . Heart attack Father        dec age 35  . Hypertension Father   . Hypertension Mother   . Hyperlipidemia Mother   . Migraines Mother   . Seizures Mother        epilepsy  . Stroke Mother        TIA's  . Thyroid disease Sister        hypothyroid  . Thyroid disease Sister        hypothyroid  . Rheum arthritis Maternal Grandmother   . Migraines Maternal Grandmother   . Cancer Maternal Grandfather 30       colon ca--DEC age 65  . Diabetes Maternal Grandfather   . Stroke Paternal Grandmother        multiple  . Thyroid disease Paternal Grandmother        goiter-hypothyroid  . Breast cancer Maternal Aunt      OBJCTIVE -PE, EKG, labs   Wt Readings from Last 3 Encounters:  10/05/20 258 lb 12.8 oz (117.4 kg)  06/08/20 251 lb (113.9 kg)  04/16/20 250 lb (113.4 kg)    Physical Exam: BP 140/76   Pulse (!) 51   Ht 5' 5"  (1.651 m)   Wt 258 lb 12.8 oz (117.4 kg)   LMP 05/05/2016 (Exact Date)   SpO2 97%   BMI 43.07 kg/m  Physical Exam Vitals reviewed.  Constitutional:      General: She is not in acute distress.    Appearance: Normal appearance. She is normal weight. She is not ill-appearing.     Comments: Morbidly obese but otherwise healthy-appearing.  Pleasant.  Well-groomed.  HENT:  Head: Normocephalic and atraumatic.  Eyes:      Extraocular Movements: Extraocular movements intact.     Pupils: Pupils are equal, round, and reactive to light.  Neck:     Vascular: No carotid bruit, hepatojugular reflux or JVD.  Cardiovascular:     Rate and Rhythm: Normal rate and regular rhythm. Occasional extrasystoles are present.    Chest Wall: PMI is not displaced (Difficult to assess).     Pulses: Normal pulses and intact distal pulses.     Heart sounds: S1 normal and S2 normal. Heart sounds are distant. No murmur heard.  No friction rub. No gallop. No S4 sounds.   Pulmonary:     Effort: Pulmonary effort is normal. No respiratory distress.     Breath sounds: Normal breath sounds.  Chest:     Chest wall: No tenderness.  Abdominal:     General: Abdomen is flat. Bowel sounds are normal. There is no distension.     Palpations: Abdomen is soft. There is no mass.     Comments: No HSM or bruit  Musculoskeletal:        General: No swelling. Normal range of motion.     Cervical back: Normal range of motion and neck supple.  Skin:    General: Skin is warm and dry.     Findings: No rash.  Neurological:     General: No focal deficit present.     Mental Status: She is alert and oriented to person, place, and time.     Cranial Nerves: No cranial nerve deficit.     Gait: Gait normal.  Psychiatric:        Mood and Affect: Mood normal.        Behavior: Behavior normal.        Thought Content: Thought content normal.        Judgment: Judgment normal.     Comments: She does seem somewhat anxious     Adult ECG Report  Rate: 51 ;  Rhythm: sinus bradycardia and Low voltage.  T wave inversions in III.  Normal axis, intervals and durations;   Narrative Interpretation: Borderline EKG.  Recent Labs:    Ref Range & Units  05/06/1020  WBC  3.2 - 9.8 x10^9/L 8.2   Hemoglobin 12.0 - 15.5 g/dL 13.9   Hematocrit 35.0 - 45.0 % 42.4   Platelets 150 - 450 x10^9/L 421    Creatinine 0.4 - 1.0 mg/dL 1.0    (eGFR)  mL/min/1.73sq m 59      Anti-dsDNA: 11; complements: C3 161 (borderline high), C4 28  No results found for: CHOL, HDL, LDLCALC, LDLDIRECT, TRIG, CHOLHDL No results found for: CREATININE, BUN, NA, K, CL, CO2 No results found for: TSH  ASSESSMENT/PLAN    Problem List Items Addressed This Visit    Episodic lightheadedness (Chronic)    Probably related to short bursts of PSVT, however uncertain.      Symptomatic PVCs (Chronic)    Continue to current dose of beta-blocker and magnesium.  Await results of Zio patch monitor.  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  The monitor probably showed short bursts which could explain some of her lightheadedness and short  spells of palpitations.  The spells are either related to PVCs or short bursts of PSVT.  Marland Kitchen  Continue beta-blocker. Discussed vagal maneuvers.         COVID-19 Education: The signs and symptoms of COVID-19 were discussed with the patient and how to seek care for testing (follow up with PCP or arrange E-visit).   The importance of social distancing and COVID-19 vaccination was discussed today.  The patient is practicing social distancing & Masking.   I spent a total of 32 minutes with the patient spent in direct patient consultation.  Additional time spent with chart review  / charting (studies, outside notes, etc): 30  Total Time: 62 min   Current medicines are reviewed at length with the patient today.  (+/- concerns) none  This visit occurred during the SARS-CoV-2 public health emergency.  Safety protocols were in place, including screening questions prior to the visit, additional usage of staff PPE, and extensive cleaning of exam room while observing appropriate contact time as  indicated for disinfecting solutions.  Notice: This dictation was prepared with Dragon dictation along with smaller phrase technology. Any transcriptional errors that result from this process are unintentional and may not be corrected upon review.  Patient Instructions / Medication Changes & Studies & Tests Ordered   Patient Instructions  Medication Instructions:  No changes *If you need a refill on your cardiac medications before your next appointment, please call your pharmacy*   Lab Work: Not needed   Testing/Procedures: Not needed   Follow-Up: At [facility], you and your health needs are our priority.  As part of our continuing mission to provide you with exceptional heart care, we have created designated Provider Care Teams.  These Care Teams include your primary Cardiologist (physician) and Advanced Practice Providers (APPs -  Physician Assistants and Nurse Practitioners) who all work together to provide you with the care you need, when you need it.    Your next appointment:    1 to 2 month(s) once we receive Zio  moniotr  The format for your next appointment:   Virtual Visit   Provider:   Glenetta Hew, MD     Studies Ordered:   No orders of the defined types were placed in this encounter.    Glenetta Hew, M.D., M.S. Interventional Cardiologist   Pager # [phone] Phone # [phone] [address] [place], [place] [address]   Thank you for choosing Heartcare at [facility]!!

## 2020-10-05 NOTE — Patient Instructions (Signed)
Medication Instructions:  No changes *If you need a refill on your cardiac medications before your next appointment, please call your pharmacy*   Lab Work: Not needed   Testing/Procedures: Not needed   Follow-Up: At [facility], you and your health needs are our priority.  As part of our continuing mission to provide you with exceptional heart care, we have created designated Provider Care Teams.  These Care Teams include your primary Cardiologist (physician) and Advanced Practice Providers (APPs -  Physician Assistants and Nurse Practitioners) who all work together to provide you with the care you need, when you need it.    Your next appointment:    1 to 2 month(s) once we receive Zio  moniotr  The format for your next appointment:   Virtual Visit   Provider:   Glenetta Hew, MD

## 2020-10-11 ENCOUNTER — Encounter: Payer: Self-pay | Admitting: Cardiology

## 2020-10-11 DIAGNOSIS — I471 Supraventricular tachycardia: Secondary | ICD-10-CM | POA: Insufficient documentation

## 2020-10-11 NOTE — Assessment & Plan Note (Signed)
As far can tell, she does not seem to be having prolonged spells of SVT.  The monitor probably showed short bursts which could explain some of her lightheadedness and short spells of palpitations.  The spells are either related to PVCs or short bursts of PSVT.  Marland Kitchen  Continue beta-blocker. Discussed vagal maneuvers.

## 2020-10-11 NOTE — Assessment & Plan Note (Signed)
Probably related to short bursts of PSVT, however uncertain.

## 2020-10-11 NOTE — Assessment & Plan Note (Signed)
Tolerating CPAP 

## 2020-10-11 NOTE — Assessment & Plan Note (Signed)
Continue to current dose of beta-blocker and magnesium.  Await results of Zio patch monitor.  She has tried multiple medications and I am not sure what other options there are besides flecainide

## 2020-12-03 ENCOUNTER — Telehealth: Payer: Self-pay | Admitting: *Deleted

## 2020-12-03 NOTE — Telephone Encounter (Signed)
Pt called in and stated she faxed the report and it was like 25 pages . She just wanted to make sure sharon rec'd it

## 2020-12-03 NOTE — Telephone Encounter (Signed)
Spoke to patient . She is aware the complete report was not sent to office from primary . Patient will call or go by office and  Request report to be faxed.  Rn had called primary  Earlier today .  The primary on has a  automated phone tree you can only leave message  For office to return your call ,unable to to speak to a person.

## 2020-12-03 NOTE — Telephone Encounter (Signed)
Left message for husband to have patient to call  In regards to tomorrow appointment 12/04/20

## 2020-12-03 NOTE — Telephone Encounter (Signed)
Patient returning call.

## 2020-12-03 NOTE — Telephone Encounter (Signed)
Spoke with patient. Fax received and given to primary nurse Ivin Booty.

## 2020-12-04 ENCOUNTER — Encounter: Payer: Self-pay | Admitting: Cardiology

## 2020-12-04 ENCOUNTER — Telehealth (INDEPENDENT_AMBULATORY_CARE_PROVIDER_SITE_OTHER): Payer: Medicare Other | Admitting: Cardiology

## 2020-12-04 ENCOUNTER — Telehealth: Payer: Self-pay | Admitting: *Deleted

## 2020-12-04 VITALS — BP 120/62 | HR 62 | Ht 65.0 in | Wt 259.4 lb

## 2020-12-04 DIAGNOSIS — I493 Ventricular premature depolarization: Secondary | ICD-10-CM

## 2020-12-04 DIAGNOSIS — I472 Ventricular tachycardia: Secondary | ICD-10-CM

## 2020-12-04 DIAGNOSIS — Z9989 Dependence on other enabling machines and devices: Secondary | ICD-10-CM

## 2020-12-04 DIAGNOSIS — I471 Supraventricular tachycardia: Secondary | ICD-10-CM | POA: Diagnosis not present

## 2020-12-04 DIAGNOSIS — G4733 Obstructive sleep apnea (adult) (pediatric): Secondary | ICD-10-CM

## 2020-12-04 DIAGNOSIS — I4729 Other ventricular tachycardia: Secondary | ICD-10-CM

## 2020-12-04 MED ORDER — METOPROLOL TARTRATE 25 MG PO TABS: 25.0000 mg | ORAL_TABLET | Freq: Three times a day (TID) | ORAL | 3 refills | Status: DC

## 2020-12-04 NOTE — Telephone Encounter (Signed)
RN spoke to patient. Instruction were given  from today's virtual visit 12/04/20.  AVS SUMMARY has been sent by mychart .   Follow  Up appointment schedule for 04/05/20 at 10 am.  Medication sent to pharmacy.   Patient verbalized understanding

## 2020-12-04 NOTE — Patient Instructions (Addendum)
Medication Instructions:   Please change to taking metoprolol: Consider taking 1-1/2 tablets twice a day, changed to taking 1 tablet 3 times a day (morning, afternoon and evening).  -> If you are having a particularly bad afternoon or evening of palpitations, take an additional 1/2 tablet and then adjust the dose at the next interval to 1/2 tablet as opposed to a full tablet)   *If you need a refill on your cardiac medications before your next appointment, please call your pharmacy*   Lab Work: None    Testing/Procedures: None   Follow-Up: At [facility], you and your health needs are our priority.  As part of our continuing mission to provide you with exceptional heart care, we have created designated Provider Care Teams.  These Care Teams include your primary Cardiologist (physician) and Advanced Practice Providers (APPs -  Physician Assistants and Nurse Practitioners) who all work together to provide you with the care you need, when you need it.    Your next appointment:   4 month(s)  The format for your next appointment:   Preferably in person, but can also be virtual  Provider:   Glenetta Hew, MD   Other Instructions Remember to stay adequately hydrated.  Drink at least 8-10 8 ounce glasses of water a day. For breakthrough spells she can also take an additional dose of magnesium.

## 2020-12-04 NOTE — Progress Notes (Signed)
Virtual Visit via Telephone Note   This visit type was conducted due to national recommendations for restrictions regarding the COVID-19 Pandemic (e.g. social distancing) in an effort to limit this patient's exposure and mitigate transmission in our community.  Due to her co-morbid illnesses, this patient is at least at moderate risk for complications without adequate follow up.  This format is felt to be most appropriate for this patient at this time.  The patient did not have access to video technology/had technical difficulties with video requiring transitioning to audio format only (telephone).  All issues noted in this document were discussed and addressed.  No physical exam could be performed with this format.  Please refer to the patient's chart for her  consent to telehealth for [facility]   Patient has given verbal permission to conduct this visit via virtual appointment and to bill insurance 12/13/2020 12:22 PM     Evaluation Performed:  Follow-up visit  Date:  12/13/2020   ID:  Maria Meza, DOB 08/05/1956, MRN [id number]  Patient Location: Home Provider Location: Office/Clinic  PCP:  Lonia Mad, MD  Cardiologist:  Glenetta Hew, MD  Electrophysiologist:  None   Chief Complaint:   Chief Complaint  Patient presents with  . Follow-up    Review test results  . Palpitations  . Tachycardia    Problem List Items Addressed This Visit    Symptomatic PVCs (Chronic)   Relevant Medications   metoprolol tartrate (LOPRESSOR) 25 MG tablet   OSA on CPAP (Chronic)   PSVT (paroxysmal supraventricular tachycardia) (HCC) - Primary (Chronic)   Relevant Medications   metoprolol tartrate (LOPRESSOR) 25 MG tablet   Nonsustained ventricular tachycardia (HCC) very short bursts; negative ischemic evaluation.   Relevant Medications   metoprolol tartrate (LOPRESSOR) 25 MG tablet     History of Present Illness:    Maria Meza is a 64 y.o. female with PMH notable for HISTORY  OF SYNCOPE (April 2021), PALPITATIONS (PVCs, PACs and PSVT and NSVT noted on monitor), who presents via audio/video conferencing for a telehealth visit today to discuss results of Zio patch monitor.   Maria Meza was seen on October 05, 2020 at the request of Lonia Mad, MD for history of symptomatic PVCs PSVT and NSVT noted on monitor.  She had been placed on a low-dose beta-blocker.  This visit was essentially to transfer cardiology care to [facility] followed by [name] at [facility] (Electrophysiology).  At the time of initial visit I did not have the Zio patch results.  She noted off-and-on episodes of palpitations once twice a week.  Occasionally is lightheaded or dizzy but no further syncope since her last visit with EP.  Palpitations did get worse ever since she was infected with Covid.  Usually these episodes are short-lived lasting only seconds but can come and go first 6 minutes at a time.  Occasionally they are associated with chest tightness.  No exertional chest tightness or pressure.  He is lightheaded with them but not any syncope or near syncope.  Did not last long for syncope.  Hospitalizations:  . None   Recent - Interim CV studies:   The following studies were reviewed today: . Cardiac MRI Heart Stress Morphology and Function (May 28, 2020): EF 69%.  Cardiac index 3 L/min.  Normal LV size, thickness and function.  No R WMA.  Normal right ventricle size thickness and function.  No RV wall motion normalities or aneurysm.  No findings consistent with ARVC.  Both atria  are mildly enlarged.  Trileaflet aortic valve with no evidence of stenosis.  Adenosine stress imaging demonstrates no evidence of inducible myocardial ischemia. Maria Meza patch event monitor (March 2021): Predominant sinus rhythm: Minimum heart rate 46 bpm, maximum 113 bpm, average 64 bpm. o Rare PACs and PVCs noted.   o 6 runs of nonsustained V. tach ranging from 8-11 beats at a rate of 134 to 245  bpm o 25 runs of SVT: Fastest was 9 beats at a rate of 200 bpm, longest was 10.6 seconds at an average rate of 130 bpm. o 33 triggers with 15 diary notifications.  Patient noted episodes of SVT or VT during daylight hours, only 1 time at roughly midnight-1 AM noted otherwise.  Patient triggers also occurred with sinus rhythm with the rare PACs and PVCs.   o No significant bradycardia or pauses  Inerval History   Maria Meza says that she is still having these short spells of irregular heartbeats and palpitations. She mostly notes them at the end of the day. When she is tired and worn out. Thankfully, these symptoms seem to have improved the father she gets from her Covid infection. However still having the least once daily. Stress makes it worse, lack of sleep makes it worse. She said that when she was initially recovering from Covid, she had lots of episodes. Not much more. She is quite symptomatic when she has episodes and is concerned about what could happen if they continue, because along with the last she starts having chest discomfort and dyspnea.  In the absence of these palpitations, she is not having any untoward symptoms from cardiac standpoint.  Cardiovascular ROS: positive for - irregular heartbeat, palpitations, rapid heart rate and These are often associated with dyspnea and chest discomfort as well as dizziness.-The symptoms not noted without the palpitations. negative for - edema, orthopnea, paroxysmal nocturnal dyspnea or Syncope or near syncope, TIA/amaurosis fugax, claudication  In the past has been intolerant of verapamil and also higher doses of beta-blocker. Has been managed with magnesium  ROS:  Please see the history of present illness.    The patient does not have symptoms concerning for COVID-19 infection (fever, chills, cough, or new shortness of breath).  Review of Systems  Constitutional: Positive for malaise/fatigue (She feels really tired after the episodes). Negative  for weight loss.  HENT: Negative for congestion and nosebleeds.   Respiratory: Negative for shortness of breath.   Gastrointestinal: Negative for blood in stool and melena.  Genitourinary: Negative for hematuria.  Musculoskeletal: Positive for joint pain. Negative for falls.  Neurological: Positive for dizziness (Associate with palpitations). Negative for focal weakness and weakness.  Psychiatric/Behavioral:       Lots of social stress    The patient is practicing social distancing.  Past Medical History:  Diagnosis Date  . Absolute anemia    No longer on iron supplementation.  . Adult celiac disease 2018  . Anxiety   . Arrhythmia 06/2016   Event monitor has shown PVCs, PSVT at 1 short burst of NSVT.;  Event monitor from March 2021: 6 short runs of wide-complex tachycardia and 25 runs of PSVT--intolerant of high-dose beta-blocker because of bradycardia.  . Cataract cortical, senile, bilateral   . 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  (ANA positive, dsDNA negative)  . Dysmenorrhea   . History of COVID-19 02/19/2020  . IBS (irritable  bowel syndrome)   . Morbid obesity with BMI of 40.0-44.9, adult ([place]) 09/2020   BMI 43.07  . MRSA infection 4-end-21   on abdomen  . OSA on CPAP   . Thyroid disease    hypothyroidism  . TIA (transient ischemic attack)    2 per patient  . Urinary incontinence    with sneezing, coughing    Past Surgical History:  Procedure Laterality Date  . CARDIAC EVENT MONITOR  06/2016   [facility]) Noted PVCs, PSVT and NSVT (1 episode of 20 beats). ->  PT evaluation suggested PVCs appear to have been completely interpolated.  Felt to be septal  . CARDIAC MRI -Stress  08/2016   [facility]): EF 69%.  No RWMA RV normal size and function.  (Does not meet requirement for ARVC).  Bilateral atrial enlargement.  Normal aortic valve.  No  evidence of ischemia or infarction.  Marland Kitchen CATARACT EXTRACTION Right 03/2018  . CATARACT EXTRACTION Left 03/2018  . CHOLECYSTECTOMY    . LAPAROSCOPIC TUBAL LIGATION  1986  . NUCLEAR STRESS TEST  05/2014   EF 55%.  Mixed anteroseptal defect consistent with breast attenuation artifact.  . TRANSTHORACIC ECHOCARDIOGRAM  08/2016   [facility]- Normal LV size and function-EF~55%.  No RWMA..  Normal LA pressures.  Normal RV function.  Trivial AR, PR and TR.  No stenoses.      Current Meds  Medication Sig  . albuterol (VENTOLIN HFA) 108 (90 Base) MCG/ACT inhaler Inhale 1 puff into the lungs as needed.  . Ascorbic Acid (VITAMIN C) 1000 MG tablet Take 1,000 mg by mouth daily.  Marland Kitchen b complex vitamins tablet Take by mouth daily.  . folic acid (FOLVITE) 1 MG tablet Take 1 mg by mouth. 2 tablets daily  . hydroxychloroquine (PLAQUENIL) 200 MG tablet Take 400 mg by mouth 2 (two) times daily.   Marland Kitchen latanoprost (XALATAN) 0.005 % ophthalmic solution Place 1 drop into both eyes daily.   Marland Kitchen levothyroxine (SYNTHROID, LEVOTHROID) 150 MCG tablet Take 1 tablet by mouth daily.  . magnesium oxide (MAG-OX) 400 MG tablet Take 1 tablet by mouth daily.   . meclizine (ANTIVERT) 25 MG tablet Take 1 tablet by mouth as needed.  . [DISCONTINUED] metoprolol tartrate (LOPRESSOR) 25 MG tablet Take 25 mg by mouth 2 (two) times daily. 1.5 tablets twice daily     Allergies:   Gluten meal, Hydrocodone, Pacerone [amiodarone], Propafenone hcl, and Verapamil   Social History   Tobacco Use  . Smoking status: Never Smoker  . Smokeless tobacco: Never Used  Vaping Use  . Vaping Use: Never used  Substance Use Topics  . Alcohol use: No    Alcohol/week: 0.0 standard drinks  . Drug use: No     Family Hx: The patient's family history includes Breast cancer in her maternal aunt; Cancer (age of onset: 85) in her maternal grandfather; Diabetes in her maternal grandfather; Heart attack in her father; Hyperlipidemia in her mother; Hypertension in her  father and mother; Migraines in her maternal grandmother and mother; Rheum arthritis in her maternal grandmother; Seizures in her mother; Stroke in her mother and paternal grandmother; Thyroid disease in her paternal grandmother, sister, and sister.   Labs/Other Tests and Data Reviewed:    EKG:  No ECG reviewed.  Recent Labs: No results found for requested labs within last 8760 hours.   Recent Lipid Panel No results found for: CHOL, TRIG, HDL, CHOLHDL, LDLCALC, LDLDIRECT  Wt Readings from Last 3 Encounters:  12/04/20 259 lb 6.4 oz (  117.7 kg)  10/05/20 258 lb 12.8 oz (117.4 kg)  06/08/20 251 lb (113.9 kg)     Objective:    Vital Signs:  BP 120/62   Pulse 62   Ht 5' 5"  (1.651 m)   Wt 259 lb 6.4 oz (117.7 kg)   LMP 05/05/2016 (Exact Date)   BMI 43.17 kg/m   VITAL SIGNS:  reviewed Pleasant female in no acute distress. A&O x 3.  normal Mood & Affect Non-labored respirations   ASSESSMENT & PLAN:    Problem List Items Addressed This Visit    Nonsustained ventricular tachycardia ([place]) very short bursts; negative ischemic evaluation. (Chronic)    Very short burst on her monitor. Thankfully she has had negative cardiac MRI stress test in June indicating no evidence of ischemia. No structural normalities to explain ARVC. She probably has small foci for PVCs, not likely at risk for prolonged episodes of sustained VT.  For now continue magnesium and increased total dose of metoprolol to 75 mg daily: she can take either 25 mg 3 times daily or 37.5 mg twice daily.      Relevant Medications   metoprolol tartrate (LOPRESSOR) 25 MG tablet   Symptomatic PVCs (Chronic)    She really did have rare PVCs on the monitor although there were some runs of "nonsustained V. tach "  Plan: Increase metoprolol to 25 mg 3 times daily or 37.5 mg twice daily - she will try both.       Relevant Medications   metoprolol tartrate (LOPRESSOR) 25 MG tablet   OSA on CPAP (Chronic)    Continue using  CPAP. Make sure she gets adequate amounts of sleep.      PSVT (paroxysmal supraventricular tachycardia) (HCC) - Primary (Chronic)    She is having short little spells lasting no more than 10 seconds of PSVT or PAT. I do not think that these episodes will last longer for vagal numbers to help break. However we did review vagal maneuvers.  Plan increase metoprolol to 1-1/2 tablets twice a day or take 1 tablet 3 times a day (morning afternoon evening) -> Okay to take an additional half tablet for breakthrough  Continue with magnesium. Maintain adequate hydration.      Relevant Medications   metoprolol tartrate (LOPRESSOR) 25 MG tablet      COVID-19 Education: The signs and symptoms of COVID-19 were discussed with the patient and how to seek care for testing (follow up with PCP or arrange E-visit).   The importance of social distancing was discussed today.  Time:   Today, I have spent 22 minutes with the patient with telehealth technology discussing the above problems.  I spent an additional 19mnutes reviewing the Zio patch monitor as well [facility]  Total time 42 minutes   Medication Adjustments/Labs and Tests Ordered: Current medicines are reviewed at length with the patient today.  Concerns regarding medicines are outlined above.   Patient Instructions  Medication Instructions:   Please change to taking metoprolol: Consider taking 1-1/2 tablets twice a day, changed to taking 1 tablet 3 times a day (morning, afternoon and evening).  -> If you are having a particularly bad afternoon or evening of palpitations, take an additional 1/2 tablet and then adjust the dose at the next interval to 1/2 tablet as opposed to a full tablet)   *If you need a refill on your cardiac medications before your next appointment, please call your pharmacy*   Lab Work: None  Testing/Procedures: None   Follow-Up: At [facility], you and your health needs are  our priority.  As part of our continuing mission to provide you with exceptional heart care, we have created designated Provider Care Teams.  These Care Teams include your primary Cardiologist (physician) and Advanced Practice Providers (APPs -  Physician Assistants and Nurse Practitioners) who all work together to provide you with the care you need, when you need it.    Your next appointment:   4 month(s)  The format for your next appointment:   Preferably in person, but can also be virtual  Provider:   Glenetta Hew, MD   Other Instructions Remember to stay adequately hydrated.  Drink at least 8-10 8 ounce glasses of water a day. For breakthrough spells she can also take an additional dose of magnesium.     Signed, Glenetta Hew, MD  12/13/2020 12:22 PM    [facility]

## 2020-12-04 NOTE — Telephone Encounter (Signed)
  Patient Consent for Virtual Visit         Maria Meza has provided verbal consent on 12/04/2020 for a virtual visit (video or telephone).   CONSENT FOR VIRTUAL VISIT FOR:  Maria Meza  By participating in this virtual visit I agree to the following:  I hereby voluntarily request, consent and authorize [facility] and its employed or contracted physicians, physician assistants, nurse practitioners or other licensed health care professionals (the Practitioner), to provide me with telemedicine health care services (the "Services") as deemed necessary by the treating Practitioner. I acknowledge and consent to receive the Services by the Practitioner via telemedicine. I understand that the telemedicine visit will involve communicating with the Practitioner through live audiovisual communication technology and the disclosure of certain medical information by electronic transmission. I acknowledge that I have been given the opportunity to request an in-person assessment or other available alternative prior to the telemedicine visit and am voluntarily participating in the telemedicine visit.  I understand that I have the right to withhold or withdraw my consent to the use of telemedicine in the course of my care at any time, without affecting my right to future care or treatment, and that the Practitioner or I may terminate the telemedicine visit at any time. I understand that I have the right to inspect all information obtained and/or recorded in the course of the telemedicine visit and may receive copies of available information for a reasonable fee.  I understand that some of the potential risks of receiving the Services via telemedicine include:  Marland Kitchen Delay or interruption in medical evaluation due to technological equipment failure or disruption; . Information transmitted may not be sufficient (e.g. poor resolution of images) to allow for appropriate medical decision making by the Practitioner;  and/or  . In rare instances, security protocols could fail, causing a breach of personal health information.  Furthermore, I acknowledge that it is my responsibility to provide information about my medical history, conditions and care that is complete and accurate to the best of my ability. I acknowledge that Practitioner's advice, recommendations, and/or decision may be based on factors not within their control, such as incomplete or inaccurate data provided by me or distortions of diagnostic images or specimens that may result from electronic transmissions. I understand that the practice of medicine is not an exact science and that Practitioner makes no warranties or guarantees regarding treatment outcomes. I acknowledge that a copy of this consent can be made available to me via my patient portal ([facility]), or I can request a printed copy by calling the office of [facility].    I understand that my insurance will be billed for this visit.   I have read or had this consent read to me. . I understand the contents of this consent, which adequately explains the benefits and risks of the Services being provided via telemedicine.  . I have been provided ample opportunity to ask questions regarding this consent and the Services and have had my questions answered to my satisfaction. . I give my informed consent for the services to be provided through the use of telemedicine in my medical care

## 2020-12-13 NOTE — Assessment & Plan Note (Signed)
Very short burst on her monitor. Thankfully she has had negative cardiac MRI stress test in June indicating no evidence of ischemia. No structural normalities to explain ARVC. She probably has small foci for PVCs, not likely at risk for prolonged episodes of sustained VT.  For now continue magnesium and increased total dose of metoprolol to 75 mg daily: she can take either 25 mg 3 times daily or 37.5 mg twice daily.

## 2020-12-13 NOTE — Assessment & Plan Note (Signed)
Continue using CPAP. Make sure she gets adequate amounts of sleep.

## 2020-12-13 NOTE — Assessment & Plan Note (Signed)
She really did have rare PVCs on the monitor although there were some runs of "nonsustained V. tach "  Plan: Increase metoprolol to 25 mg 3 times daily or 37.5 mg twice daily - she will try both.

## 2020-12-13 NOTE — Assessment & Plan Note (Signed)
She is having short little spells lasting no more than 10 seconds of PSVT or PAT. I do not think that these episodes will last longer for vagal numbers to help break. However we did review vagal maneuvers.  Plan increase metoprolol to 1-1/2 tablets twice a day or take 1 tablet 3 times a day (morning afternoon evening) -> Okay to take an additional half tablet for breakthrough  Continue with magnesium. Maintain adequate hydration.

## 2021-02-15 MED ORDER — MAGNESIUM OXIDE 400 MG PO TABS: 1.0000 | ORAL_TABLET | Freq: Every day | ORAL | 3 refills | Status: DC

## 2021-04-05 ENCOUNTER — Encounter: Payer: Self-pay | Admitting: Cardiology

## 2021-04-05 ENCOUNTER — Other Ambulatory Visit: Payer: Self-pay

## 2021-04-05 ENCOUNTER — Ambulatory Visit (INDEPENDENT_AMBULATORY_CARE_PROVIDER_SITE_OTHER): Payer: Medicare Other | Admitting: Cardiology

## 2021-04-05 VITALS — BP 136/72 | HR 54 | Ht 65.0 in | Wt 266.0 lb

## 2021-04-05 DIAGNOSIS — I472 Ventricular tachycardia: Secondary | ICD-10-CM

## 2021-04-05 DIAGNOSIS — I471 Supraventricular tachycardia: Secondary | ICD-10-CM | POA: Diagnosis not present

## 2021-04-05 DIAGNOSIS — R42 Dizziness and giddiness: Secondary | ICD-10-CM

## 2021-04-05 DIAGNOSIS — I4729 Other ventricular tachycardia: Secondary | ICD-10-CM

## 2021-04-05 DIAGNOSIS — Z9989 Dependence on other enabling machines and devices: Secondary | ICD-10-CM

## 2021-04-05 DIAGNOSIS — I493 Ventricular premature depolarization: Secondary | ICD-10-CM | POA: Diagnosis not present

## 2021-04-05 DIAGNOSIS — G4733 Obstructive sleep apnea (adult) (pediatric): Secondary | ICD-10-CM

## 2021-04-05 NOTE — Progress Notes (Signed)
Primary Care Provider: Assunta Meza, [place] Cardiologist: Maria Hew, MD Electrophysiologist: None  Electrophysiologist:  Maria Meza from [place] (last visit April 14, 2020)  Clinic Note: Chief Complaint  Patient presents with  . Follow-up    4 months.  . Headache  . Edema    Hands yesterday.  . Palpitations    Much better controlled since taking beta-blocker 3 times a day.   ===================================  ASSESSMENT/PLAN   Problem List Items Addressed This Visit    Nonsustained ventricular tachycardia (Maria Meza) very short bursts; negative ischemic evaluation. (Chronic)    Relatively well controlled now with TID dosing beta-blocker and magnesium.      Episodic lightheadedness (Chronic)    Very likely related to be short little burst of a few seconds PAT. Reiterated the importance of staying dehydrated.      Symptomatic PVCs (Chronic)    Rare episodic PVCs with nonsustained VT runs.  Symptomatically improved on TID dosing of beta-blocker.  Cannot really titrate further because impressive bradycardia.  Continue with PRN additional doses.  Has not required in a long time.  Maintain adequate hydration.  Avoid triggers such as caffeine and excess stress as well as sweets.      Relevant Orders   EKG 12-Lead (Completed)   OSA on CPAP (Chronic)   PSVT (paroxysmal supraventricular tachycardia) (HCC) - Primary (Chronic)    Has not had any episodes lasting more than a few seconds-10 seconds on the most.  Likely not true PSVT.  Not lasting long enough to do berry maneuvers however we did discuss weight increase. Excellent overall seem to be improved with TID dosing and beta-blocker.  Okay to take additional one half to full tablet for breakthrough spells.  Avoid triggers      Relevant Orders   EKG 12-Lead (Completed)     ===================================  HPI:    Maria Meza is a 65 y.o. female with a PMH notable for SYMPTOMATIC PVCS/PSVT/NSVT (on  low-dose beta-blocker--has bradycardia with higher doses) who presents here for 22-monthfollow-up  Maria Mallenwas last seen on December 04, 2020 via video telehealth conferencing to discuss results of his Zio patch monitor from October.  [facility]  Her PCP had placed a Zio patch monitor on her and started low-dose beta-blocker.  When I saw her she was noticing episodic palpitations maybe once or twice a week occasional associated lightheadedness and dizziness.  Symptoms did worsen after COVID infection.  No syncope.  Occasionally associate with chest tightness but does not notice exertional chest tightness.->  Had lightheadedness but no syncope.  Convert metoprolol to 25 mg 3 times daily from 37.5 mg twice daily with taking additional 1/2 tablet PRN.  Recent Hospitalizations: None  Reviewed  CV studies:    The following studies were reviewed today: (if available, images/films reviewed: From Epic Chart or Care Everywhere) . None:   Interval History:   DJobeth Pangilinanreturns today overall doing much better.  She says that since switching her metoprolol dosing to 3 times daily from twice daily, she has had much less palpitations.  She did still have intermittent episodes of palpitations but this had to be much less frequent and last less time.  About the only episode it was notable was about a week ago she had an episode that lasted about 24 hours with heart rate go up and down intermittently.  She does occasionally get dizzy with palpitations but no syncope  or near syncope.  She does note fatigue palpitations.  It also seems that when her thyroid levels have been stabilized, her palpitations have improved.  No signs of chronotropic incompetence.  Able to exercise without significant fatigue or dyspnea.  CV Review of Symptoms (Summary): positive for - irregular heartbeat, palpitations, rapid heart rate and Less frequent, less  prominent and last less time.  Much better since TID dosing versus twice daily dosing of beta-blocker.  Rarely uses extra dose.;  Mild exertional dyspnea-deconditioning. negative for - edema, orthopnea, paroxysmal nocturnal dyspnea, shortness of breath or Maybe some lightheadedness with palpitations but no syncope or near syncope, no TIA or amaurosis fugax.  No claudication  The patient does not have symptoms concerning for COVID-19 infection (fever, chills, cough, or new shortness of breath).   REVIEWED OF SYSTEMS   Review of Systems  Constitutional: Positive for malaise/fatigue (Energy level is better.  She just feels fatigued when she has palpitation spells.). Negative for weight loss.  HENT: Negative for congestion and nosebleeds.   Respiratory: Positive for shortness of breath (Improving.  Deconditioned). Negative for cough.   Cardiovascular: Positive for palpitations. Negative for leg swelling.  Gastrointestinal: Negative for blood in stool, constipation and melena.  Genitourinary: Negative for hematuria.  Musculoskeletal: Negative for falls, joint pain, myalgias and neck pain.  Neurological: Positive for dizziness (Off-and-on feels dizzy with the palpitations, sometimes positional.). Negative for focal weakness, seizures and weakness.  Psychiatric/Behavioral: Negative for memory loss. The patient is nervous/anxious (Still has social stress, handling it better.). The patient does not have insomnia.    I have reviewed and (if needed) personally updated the patient's problem list, medications, allergies, past medical and surgical history, social and family history.   PAST MEDICAL HISTORY   Past Medical History:  Diagnosis Date  . Absolute anemia    No longer on iron supplementation.  . Adult celiac disease 2018  . Anxiety   . Arrhythmia 06/2016   Event monitor has shown PVCs, PSVT at 1 short burst of NSVT.;  Event monitor from March 2021: 6 short runs of wide-complex tachycardia and  25 runs of PSVT--intolerant of high-dose beta-blocker because of bradycardia.  . Cataract cortical, senile, bilateral   . 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  (ANA positive, dsDNA negative)  . Dysmenorrhea   . History of COVID-19 02/19/2020  . IBS (irritable bowel syndrome)   . Morbid obesity with BMI of 40.0-44.9, adult ([place]) 09/2020   BMI 43.07  . MRSA infection 4-end-21   on abdomen  . OSA on CPAP   . Thyroid disease    hypothyroidism  . TIA (transient ischemic attack)    2 per patient  . Urinary incontinence    with sneezing, coughing    PAST SURGICAL HISTORY   Past Surgical History:  Procedure Laterality Date  . CARDIAC EVENT MONITOR  06/2016   [facility]) Noted PVCs, PSVT and NSVT (1 episode of 20 beats). ->  PT evaluation suggested PVCs appear to have been completely interpolated.  Felt to be septal  . CARDIAC MRI -ADENOSINE / STRESS  05/28/2020   [facility]): EF 69%. Normal LV size, thickness and function w/ NO RWMA  CO & CI 6.5 L/min & 3 L/min.      Normal RV size thickness and function.  No RV WMA or aneurysm.  No findings c/w  ARVC.  Both atria  are mildly enlarged.  Trileaflet aortic valve with no evidence of stenosis.  Adenosine Stress Imaging: no  inducible myocardial ischemia OR prior infarction/scar or infiltrate. Normal AoV. Mid TR.  Marland Kitchen CATARACT EXTRACTION Right 03/2018  . CATARACT EXTRACTION Left 03/2018  . CHOLECYSTECTOMY    . LAPAROSCOPIC TUBAL LIGATION  1986  . NUCLEAR STRESS TEST  05/2014   EF 55%.  Mixed anteroseptal defect consistent with breast attenuation artifact.  . TRANSTHORACIC ECHOCARDIOGRAM  08/2016   [facility]- Normal LV size and function-EF~55%.  No RWMA..  Normal LA pressures.  Normal RV function.  Trivial AR, PR and TR.  No stenoses.   Marland Kitchen ZIO PATCH CARDIAC EVENT MONITOR  02/2020   [facility] Rhytym: SR - min 46 - max 113 bpm, avg  64 bpm. Rare isolated PVC & PACs. 6 short NSVT runs (8-11 beats, 134-245 bpm); 25 runs of  PAT/SVT - fastest 9 beats (200 bpm), longest 10.6 sec (130 bpm). Noted Sx w/ PAT & NSVT during daylight hours, and only once at midnight.     There is no immunization history on file for this patient.  MEDICATIONS/ALLERGIES   Current Meds  Medication Sig  . albuterol (VENTOLIN HFA) 108 (90 Base) MCG/ACT inhaler Inhale 1 puff into the lungs as needed.  . Ascorbic Acid (VITAMIN C) 1000 MG tablet Take 1,000 mg by mouth daily.  Marland Kitchen b complex vitamins tablet Take by mouth daily.  . folic acid (FOLVITE) 1 MG tablet Take 1 mg by mouth. 2 tablets daily  . hydroxychloroquine (PLAQUENIL) 200 MG tablet Take 400 mg by mouth 2 (two) times daily.   Marland Kitchen latanoprost (XALATAN) 0.005 % ophthalmic solution Place 1 drop into both eyes daily.   Marland Kitchen levothyroxine (SYNTHROID, LEVOTHROID) 150 MCG tablet Take 1 tablet by mouth daily.  . magnesium oxide (MAG-OX) 400 MG tablet Take 1 tablet (400 mg total) by mouth daily.  . meclizine (ANTIVERT) 25 MG tablet Take 1 tablet by mouth as needed.  . metoprolol tartrate (LOPRESSOR) 25 MG tablet Take 1 tablet (25 mg total) by mouth 3 (three) times daily. If you are having a particularly bad afternoon or evening of palpitations, take an additional 1/2 tablet and then adjust the dose at the next interval to 1/2 tablet as opposed to a full tablet)    Allergies  Allergen Reactions  . Gluten Meal     Pt has celiac disease.   Marland Kitchen Hydrocodone Nausea And Vomiting  . Pacerone [Amiodarone] Other (See Comments)    "does not tolerate"--slows heart rate  . Propafenone Hcl     Caused very low HR  . Verapamil     Fatigue, constipation    SOCIAL HISTORY/FAMILY HISTORY   Reviewed in Epic:  Pertinent findings:  Social History   Tobacco Use  . Smoking status: Never Smoker  . Smokeless tobacco: Never Used  Vaping Use  . Vaping Use: Never used  Substance Use Topics  . Alcohol use: No     Alcohol/week: 0.0 standard drinks  . Drug use: No   Social History   Social History Narrative   She lives in [place], [place]   Right handed    Caffeine use: 1 cup coffee every morning    OBJCTIVE -PE, EKG, labs   Wt Readings from Last 3 Encounters:  04/05/21 266 lb (120.7 kg)  12/04/20 259 lb 6.4 oz (117.7 kg)  10/05/20 258 lb 12.8 oz (117.4 kg)    Physical Exam: BP 136/72 (BP Location: Left Arm, Patient Position: Sitting, Cuff  Size: Normal)   Pulse (!) 54   Ht 5' 5"  (1.651 m)   Wt 266 lb (120.7 kg)   LMP 05/05/2016 (Exact Date)   BMI 44.26 kg/m  Physical Exam Constitutional:      General: She is not in acute distress.    Appearance: She is obese. She is not ill-appearing or toxic-appearing.     Comments: Morbidly obese.  Well-groomed.  HENT:     Head: Normocephalic and atraumatic.  Neck:     Vascular: No carotid bruit, hepatojugular reflux or JVD (Difficult to assess).  Cardiovascular:     Rate and Rhythm: Regular rhythm. Bradycardia present. Occasional extrasystoles are present.    Chest Wall: PMI is not displaced (Difficult assess).     Pulses: Intact distal pulses. Decreased pulses (Bilateral pedal pulses).     Heart sounds: S1 normal and S2 normal. Heart sounds are distant. No murmur heard. No friction rub. No gallop.   Pulmonary:     Effort: Respiratory distress present.     Breath sounds: Normal breath sounds. No wheezing, rhonchi or rales.  Chest:     Chest wall: No tenderness.  Musculoskeletal:        General: Swelling (Trivial, bilateral LE) present. Normal range of motion.     Cervical back: Normal range of motion and neck supple.  Skin:    General: Skin is warm and dry.  Neurological:     General: No focal deficit present.     Mental Status: She is alert and oriented to person, place, and time.  Psychiatric:        Mood and Affect: Mood normal.        Behavior: Behavior normal.        Judgment: Judgment normal.     Comments: Notably less anxious      Adult ECG Report  Rate: 54;  Rhythm: sinus bradycardia and Low voltage.  Otherwise normal axis, intervals and durations.;   Narrative Interpretation: Stable EKG  Recent Labs: 10/21/2020: Hgb 13.6. Cr 0.76.  No results found for: CHOL, HDL, LDLCALC, LDLDIRECT, TRIG, CHOLHDL No results found for: CREATININE, BUN, NA, K, CL, CO2   No results found for: TSH  ==================================================  COVID-19 Education: The signs and symptoms of COVID-19 were discussed with the patient and how to seek care for testing (follow up with PCP or arrange E-visit).   The importance of social distancing and COVID-19 vaccination was discussed today. The patient is practicing social distancing & Masking.   I spent a total of 54mnutes with the patient spent in direct patient consultation.  Additional time spent with chart review  / charting (studies, outside notes, etc): 14 min Total Time: 31 min   Current medicines are reviewed at length with the patient today.  (+/- concerns) n/a  This visit occurred during the SARS-CoV-2 public health emergency.  Safety protocols were in place, including screening questions prior to the visit, additional usage of staff PPE, and extensive cleaning of exam room while observing appropriate contact time as indicated for disinfecting solutions.  Notice: This dictation was prepared with Dragon dictation along with smaller phrase technology. Any transcriptional errors that result from this process are unintentional and may not be corrected upon review.  Patient Instructions / Medication Changes & Studies & Tests Ordered   Patient Instructions  Medication Instructions:  No changes.  *If you need a refill on your cardiac medications before your next appointment, please call your pharmacy*   Lab Work: None ordered.  If you have labs (blood work) drawn today and your tests are completely normal, you will receive your results only by: Marland Kitchen MyChart  Message (if you have MyChart) OR . A paper copy in the mail If you have any lab test that is abnormal or we need to change your treatment, we will call you to review the results.   Testing/Procedures: None ordered.    Follow-Up: At [facility], you and your health needs are our priority.  As part of our continuing mission to provide you with exceptional heart care, we have created designated Provider Care Teams.  These Care Teams include your primary Cardiologist (physician) and Advanced Practice Providers (APPs -  Physician Assistants and Nurse Practitioners) who all work together to provide you with the care you need, when you need it.   Your next appointment:   12 month(s)  The format for your next appointment:   In Person  Provider:   Glenetta Hew, MD      Studies Ordered:   Orders Placed This Encounter  Procedures  . EKG 12-Lead     Maria Meza, M.D., M.S. Interventional Cardiologist   Pager # [phone] Phone # [phone] [address] [place], [place] [address]   Thank you for choosing Heartcare at [facility]!!

## 2021-04-05 NOTE — Patient Instructions (Addendum)
Medication Instructions:  No changes.  *If you need a refill on your cardiac medications before your next appointment, please call your pharmacy*   Lab Work: None ordered.   If you have labs (blood work) drawn today and your tests are completely normal, you will receive your results only by: Marland Kitchen MyChart Message (if you have MyChart) OR . A paper copy in the mail If you have any lab test that is abnormal or we need to change your treatment, we will call you to review the results.   Testing/Procedures: None ordered.    Follow-Up: At [facility], you and your health needs are our priority.  As part of our continuing mission to provide you with exceptional heart care, we have created designated Provider Care Teams.  These Care Teams include your primary Cardiologist (physician) and Advanced Practice Providers (APPs -  Physician Assistants and Nurse Practitioners) who all work together to provide you with the care you need, when you need it.   Your next appointment:   12 month(s)  The format for your next appointment:   In Person  Provider:   Glenetta Hew, MD

## 2021-04-21 ENCOUNTER — Encounter: Payer: Self-pay | Admitting: Cardiology

## 2021-04-21 NOTE — Assessment & Plan Note (Signed)
Very likely related to be short little burst of a few seconds PAT. Reiterated the importance of staying dehydrated.

## 2021-04-21 NOTE — Assessment & Plan Note (Signed)
Rare episodic PVCs with nonsustained VT runs.  Symptomatically improved on TID dosing of beta-blocker.  Cannot really titrate further because impressive bradycardia.  Continue with PRN additional doses.  Has not required in a long time.  Maintain adequate hydration.  Avoid triggers such as caffeine and excess stress as well as sweets.

## 2021-04-21 NOTE — Assessment & Plan Note (Signed)
Relatively well controlled now with TID dosing beta-blocker and magnesium.

## 2021-04-21 NOTE — Assessment & Plan Note (Signed)
Has not had any episodes lasting more than a few seconds-10 seconds on the most.  Likely not true PSVT.  Not lasting long enough to do berry maneuvers however we did discuss weight increase. Excellent overall seem to be improved with TID dosing and beta-blocker.  Okay to take additional one half to full tablet for breakthrough spells.  Avoid triggers

## 2021-04-22 ENCOUNTER — Telehealth: Payer: Self-pay | Admitting: *Deleted

## 2021-04-22 DIAGNOSIS — I471 Supraventricular tachycardia: Secondary | ICD-10-CM

## 2021-04-22 DIAGNOSIS — G4733 Obstructive sleep apnea (adult) (pediatric): Secondary | ICD-10-CM

## 2021-04-22 NOTE — Telephone Encounter (Signed)
Patient in the office today with Husband Salem Senate. She and her husband would an referral for new primary physician. The previous  doctor left apractice in [place]   per Dr Ellyn Hack, placed referral to [facility] Primary at [facility]- Dr Jerline Pain

## 2021-05-03 ENCOUNTER — Encounter: Payer: Self-pay | Admitting: Cardiology

## 2021-06-09 ENCOUNTER — Ambulatory Visit: Payer: Self-pay | Admitting: Obstetrics and Gynecology

## 2021-06-18 NOTE — Progress Notes (Signed)
65 y.o. G81P2002 Married Caucasian female here for annual exam.    Had a late menopause.  No more vaginal bleeding.   Treating low vit D and takes 50,000 IU weekly.   Retired about a year ago.  Cared for mother immediately after her retirement.   Husband is diabetic and patient has gluten allergy.  Learning about new dietary choices.  PCP:   Allyn Kenner, MD  Patient's last menstrual period was 05/05/2016 (exact date).           Sexually active: No.  The current method of family planning is post menopausal status.    Exercising: yes   some Smoker:  no  Health Maintenance: Pap:  06-04-19 normal neg HPV History of abnormal Pap:  no MMG:  09-21-20 normal Colonoscopy:  2016 normal.  Due in 2026.  BMD: 02-2017 Result  normal TDaP:  2012 Gardasil:   no HIV:neg 2017 Hep C:neg 2017 Screening Labs:  Hb today , Urine today: no   reports that she has never smoked. She has never used smokeless tobacco. She reports that she does not drink alcohol and does not use drugs.  Past Medical History:  Diagnosis Date   Absolute anemia    No longer on iron supplementation.   Adult celiac disease 2018   Anxiety    Arrhythmia 06/2016   Event monitor has shown PVCs, PSVT at 1 short burst of NSVT.;  Event monitor from March 2021: 6 short runs of wide-complex tachycardia and 25 runs of PSVT--intolerant of high-dose beta-blocker because of bradycardia.   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  (ANA positive, dsDNA negative)   Dysmenorrhea    Glaucoma    History of COVID-19 02/19/2020   IBS (irritable bowel syndrome)    Morbid obesity with BMI of 40.0-44.9, adult ([place]) 09/2020   BMI 43.07   MRSA infection 4-end-21   on abdomen   OSA on CPAP    Thyroid disease    hypothyroidism   TIA (transient ischemic attack)    2 per  patient   Urinary incontinence    with sneezing, coughing    Past Surgical History:  Procedure Laterality Date   CARDIAC EVENT MONITOR  06/2016   [facility]) Noted PVCs, PSVT and NSVT (1 episode of 20 beats). ->  PT evaluation suggested PVCs appear to have been completely interpolated.  Felt to be septal   CARDIAC MRI -ADENOSINE / STRESS  05/28/2020   [facility]): EF 69%. Normal LV size, thickness and function w/ NO RWMA  CO & CI 6.5 L/min & 3 L/min.      Normal RV size thickness and function.  No RV WMA or aneurysm.  No findings c/w  ARVC.  Both atria are mildly enlarged.  Trileaflet aortic valve with no evidence of stenosis.  Adenosine Stress Imaging: no  inducible myocardial ischemia OR prior infarction/scar or infiltrate. Normal AoV. Mid TR.   CATARACT EXTRACTION Right 03/2018   CATARACT EXTRACTION Left 03/2018   CHOLECYSTECTOMY     LAPAROSCOPIC TUBAL LIGATION  1986   NUCLEAR STRESS TEST  05/2014   EF 55%.  Mixed anteroseptal defect consistent with breast attenuation artifact.   TRANSTHORACIC ECHOCARDIOGRAM  08/2016   [facility]- Normal LV size and function-EF~55%.  No RWMA..  Normal LA pressures.  Normal RV function.  Trivial AR, PR and TR.  No stenoses.    ZIO PATCH CARDIAC EVENT MONITOR  02/2020   Predom Rhytym: SR - min 46 - max 113 bpm, avg 64 bpm. Rare isolated PVC & PACs. 6 short NSVT runs (8-11 beats, 134-245 bpm); 25 runs of  PAT/SVT - fastest 9 beats (200 bpm), longest 10.6 sec (130 bpm). Noted Sx w/ PAT & NSVT during daylight hours, and only once at midnight.    Current Outpatient Medications  Medication Sig Dispense Refill   albuterol (VENTOLIN HFA) 108 (90 Base) MCG/ACT inhaler Inhale 1 puff into the lungs as needed.     Ascorbic Acid (VITAMIN C) 1000 MG tablet Take 1,000 mg by mouth daily.     b complex vitamins tablet Take by mouth daily.     folic acid (FOLVITE) 1 MG tablet Take 1 mg by mouth. 2 tablets daily     hydroxychloroquine (PLAQUENIL) 200 MG tablet Take 400 mg by mouth 2  (two) times daily.      latanoprost (XALATAN) 0.005 % ophthalmic solution Place 1 drop into both eyes daily.      levothyroxine (SYNTHROID, LEVOTHROID) 150 MCG tablet Take 1 tablet by mouth daily.  3   magnesium oxide (MAG-OX) 400 (241.3 Mg) MG tablet Take 1 tablet by mouth daily.     magnesium oxide (MAG-OX) 400 MG tablet Take 1 tablet (400 mg total) by mouth daily. 90 tablet 3   meclizine (ANTIVERT) 25 MG tablet Take 1 tablet by mouth as needed.     metoprolol tartrate (LOPRESSOR) 25 MG tablet Take 1 tablet (25 mg total) by mouth 3 (three) times daily. If you are having a particularly bad afternoon or evening of palpitations, take an additional 1/2 tablet and then adjust the dose at the next interval to 1/2 tablet as opposed to a full tablet) 270 tablet 3   Vitamin D, Ergocalciferol, (DRISDOL) 1.25 MG (50000 UNIT) CAPS capsule Take 50,000 Units by mouth once a week.     No current facility-administered medications for this visit.    Family History  Problem Relation Age of Onset   Heart attack Father        dec age 25   Hypertension Father    Hypertension Mother    Hyperlipidemia Mother    Migraines Mother    Seizures Mother        epilepsy   Stroke Mother        TIA's   Thyroid disease Sister        hypothyroid   Thyroid disease Sister        hypothyroid   Rheum arthritis Maternal Grandmother    Migraines Maternal Grandmother    Cancer Maternal Grandfather 5       colon ca--DEC age 20   Diabetes Maternal Grandfather    Stroke Paternal Grandmother        multiple   Thyroid disease Paternal Grandmother        goiter-hypothyroid   Breast cancer Maternal Aunt     Review of Systems  Constitutional: Negative.   HENT: Negative.    Eyes: Negative.   Respiratory: Negative.    Cardiovascular: Negative.   Gastrointestinal: Negative.   Endocrine: Negative.   Genitourinary: Negative.   Musculoskeletal: Negative.   Skin: Negative.   Allergic/Immunologic: Negative.    Neurological: Negative.   Hematological: Negative.   Psychiatric/Behavioral: Negative.     Exam:   BP 118/78   Pulse (!) 58   Resp 16   Ht 5' 5"  (1.651 m)  Wt 262 lb (118.8 kg)   LMP 05/05/2016 (Exact Date)   BMI 43.60 kg/m     General appearance: alert, cooperative and appears stated age Head: normocephalic, without obvious abnormality, atraumatic Neck: no adenopathy, supple, symmetrical, trachea midline and thyroid normal to inspection and palpation Lungs: clear to auscultation bilaterally Breasts: normal appearance, no masses or tenderness, No nipple retraction or dimpling on right, left nipple inversion (old change), No nipple discharge or bleeding, No axillary adenopathy Heart: regular rate and rhythm Abdomen: soft, non-tender; no masses, no organomegaly Extremities: extremities normal, atraumatic, no cyanosis or edema Skin: skin color, texture, turgor normal. No rashes or lesions Lymph nodes: cervical, supraclavicular, and axillary nodes normal. Neurologic: grossly normal  Pelvic: External genitalia:  no lesions              No abnormal inguinal nodes palpated.              Urethra:  normal appearing urethra with no masses, tenderness or lesions              Bartholins and Skenes: normal                 Vagina: normal appearing vagina with normal color and discharge, no lesions              Cervix: no lesions              Pap taken: yes Bimanual Exam:  Uterus:  normal size, contour, position, consistency, mobility, non-tender              Adnexa: no mass, fullness, tenderness              Rectal exam: yes.  Confirms.              Anus:  normal sphincter tone, no lesions  Chaperone was present for exam.  Assessment:   Pelvic exam without abnormal finding.  Cervical cancer screening.  Screening breast exam.  Left nipple inversion.  Rectal exam.  Postmenopausal female. On Plaquenil.  Connective tissue disorder. Hx iron deficiency anemia.   Celiac  disease. Started menopause late. Hx TIA. Hypothyroidism. Candida of flexural fold. Encounter for vaccination.   Plan: Mammogram screening discussed. Self breast awareness reviewed. Pap and HR HPV as above. Guidelines for Calcium, Vitamin D, regular exercise program including cardiovascular and weight bearing exercise. TDap. Nyatatin powder.  Follow up annually and prn.   20 min total time was spent for this patient encounter, including preparation, face-to-face counseling with the patient, coordination of care, and documentation of the encounter.

## 2021-06-25 ENCOUNTER — Encounter: Payer: Self-pay | Admitting: Obstetrics and Gynecology

## 2021-06-25 ENCOUNTER — Other Ambulatory Visit (HOSPITAL_COMMUNITY)
Admission: RE | Admit: 2021-06-25 | Discharge: 2021-06-25 | Disposition: A | Discharge status: Home / Self Care | Payer: Medicare Other | Source: Ambulatory Visit | Attending: Obstetrics and Gynecology | Admitting: Obstetrics and Gynecology

## 2021-06-25 ENCOUNTER — Other Ambulatory Visit: Payer: Self-pay

## 2021-06-25 ENCOUNTER — Ambulatory Visit (INDEPENDENT_AMBULATORY_CARE_PROVIDER_SITE_OTHER): Payer: Medicare Other | Admitting: Obstetrics and Gynecology

## 2021-06-25 VITALS — BP 118/78 | HR 58 | Resp 16 | Ht 65.0 in | Wt 262.0 lb

## 2021-06-25 DIAGNOSIS — Z23 Encounter for immunization: Secondary | ICD-10-CM

## 2021-06-25 DIAGNOSIS — Z01419 Encounter for gynecological examination (general) (routine) without abnormal findings: Secondary | ICD-10-CM | POA: Diagnosis not present

## 2021-06-25 DIAGNOSIS — Z124 Encounter for screening for malignant neoplasm of cervix: Secondary | ICD-10-CM | POA: Diagnosis present

## 2021-06-25 DIAGNOSIS — B372 Candidiasis of skin and nail: Secondary | ICD-10-CM

## 2021-06-25 DIAGNOSIS — Z1239 Encounter for other screening for malignant neoplasm of breast: Secondary | ICD-10-CM

## 2021-06-25 DIAGNOSIS — Z008 Encounter for other general examination: Secondary | ICD-10-CM

## 2021-06-25 MED ORDER — NYSTATIN 100000 UNIT/GM EX POWD: 1.0000 "application " | Freq: Three times a day (TID) | CUTANEOUS | 2 refills | Status: DC

## 2021-06-25 NOTE — Patient Instructions (Signed)

## 2021-06-29 LAB — CYTOLOGY - PAP: Diagnosis: NEGATIVE

## 2021-07-19 MED ORDER — MAGNESIUM OXIDE 400 MG PO TABS: 1.0000 | ORAL_TABLET | Freq: Every day | ORAL | 3 refills | Status: DC

## 2021-07-19 NOTE — Telephone Encounter (Signed)
E-sent prescription to pharmacy 90 day supply with 3 refills.

## 2021-07-21 ENCOUNTER — Emergency Department (HOSPITAL_COMMUNITY)
Admission: EM | Admit: 2021-07-21 | Discharge: 2021-07-21 | Disposition: A | Discharge status: Home / Self Care | Payer: Medicare Other | Attending: Emergency Medicine | Admitting: Emergency Medicine

## 2021-07-21 ENCOUNTER — Emergency Department (HOSPITAL_COMMUNITY): Payer: Medicare Other

## 2021-07-21 DIAGNOSIS — R0781 Pleurodynia: Secondary | ICD-10-CM | POA: Diagnosis not present

## 2021-07-21 DIAGNOSIS — Y92512 Supermarket, store or market as the place of occurrence of the external cause: Secondary | ICD-10-CM | POA: Diagnosis not present

## 2021-07-21 DIAGNOSIS — R109 Unspecified abdominal pain: Secondary | ICD-10-CM | POA: Diagnosis not present

## 2021-07-21 DIAGNOSIS — W1839XA Other fall on same level, initial encounter: Secondary | ICD-10-CM | POA: Diagnosis not present

## 2021-07-21 DIAGNOSIS — W19XXXA Unspecified fall, initial encounter: Secondary | ICD-10-CM

## 2021-07-21 DIAGNOSIS — M25571 Pain in right ankle and joints of right foot: Secondary | ICD-10-CM | POA: Diagnosis not present

## 2021-07-21 DIAGNOSIS — M25561 Pain in right knee: Secondary | ICD-10-CM | POA: Insufficient documentation

## 2021-07-21 DIAGNOSIS — Z8616 Personal history of COVID-19: Secondary | ICD-10-CM | POA: Insufficient documentation

## 2021-07-21 LAB — COMPREHENSIVE METABOLIC PANEL
ALT: 33 U/L (ref 0–44)
AST: 29 U/L (ref 15–41)
Albumin: 4.1 g/dL (ref 3.5–5.0)
Alkaline Phosphatase: 74 U/L (ref 38–126)
Anion gap: 8 (ref 5–15)
BUN: 18 mg/dL (ref 8–23)
CO2: 29 mmol/L (ref 22–32)
Calcium: 9.3 mg/dL (ref 8.9–10.3)
Chloride: 101 mmol/L (ref 98–111)
Creatinine, Ser: 0.95 mg/dL (ref 0.44–1.00)
GFR, Estimated: >60 mL/min (ref >60)
Glucose, Bld: 116 mg/dL — ABNORMAL HIGH (ref 70–99)
Potassium: 4.3 mmol/L (ref 3.5–5.1)
Sodium: 138 mmol/L (ref 135–145)
Total Bilirubin: 0.4 mg/dL (ref 0.3–1.2)
Total Protein: 6.5 g/dL (ref 6.5–8.1)

## 2021-07-21 LAB — CBC
HCT: 40.8 % (ref 36.0–46.0)
Hemoglobin: 13.4 g/dL (ref 12.0–15.0)
MCH: 31.4 pg (ref 26.0–34.0)
MCHC: 32.8 g/dL (ref 30.0–36.0)
MCV: 95.6 fL (ref 80.0–100.0)
Platelets: 353 10*3/uL (ref 150–400)
RBC: 4.27 MIL/uL (ref 3.87–5.11)
RDW: 13.2 % (ref 11.5–15.5)
WBC: 13.1 10*3/uL — ABNORMAL HIGH (ref 4.0–10.5)
nRBC: 0 % (ref 0.0–0.2)

## 2021-07-21 MED ORDER — IOHEXOL 300 MG/ML  SOLN
100.0000 mL | Freq: Once | INTRAMUSCULAR | Status: AC | PRN
  Administered 2021-07-21: 100 mL via INTRAVENOUS

## 2021-07-21 MED ORDER — ACETAMINOPHEN 325 MG PO TABS
650.0000 mg | ORAL_TABLET | Freq: Once | ORAL | Status: AC
  Administered 2021-07-21: 650 mg via ORAL
  Filled 2021-07-21: qty 2

## 2021-07-21 NOTE — ED Provider Notes (Signed)
[facility] EMERGENCY DEPARTMENT Provider Note   CSN: [id number] Arrival date & time: 07/21/21  0021     History Chief Complaint  Patient presents with   Maria Meza is a 65 y.o. female.  Patient presents with chief complaint of fall.  She states she was at supermarket last night when she fell forwards, complaining of right flank/abdominal pain right knee pain right ankle pain.  Denies head injury denies loss consciousness.  Otherwise denies recent illnesses such as fevers or cough or vomiting or diarrhea.      Past Medical History:  Diagnosis Date   Absolute anemia    No longer on iron supplementation.   Adult celiac disease 2018   Anxiety    Arrhythmia 06/2016   Event monitor has shown PVCs, PSVT at 1 short burst of NSVT.;  Event monitor from March 2021: 6 short runs of wide-complex tachycardia and 25 runs of PSVT--intolerant of high-dose beta-blocker because of bradycardia.   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  (ANA positive, dsDNA negative)   Dysmenorrhea    Glaucoma    History of COVID-19 02/19/2020   IBS (irritable bowel syndrome)    Morbid obesity with BMI of 40.0-44.9, adult ([place]) 09/2020   BMI 43.07   MRSA infection 4-end-21   on abdomen   OSA on CPAP    Thyroid disease    hypothyroidism   TIA (transient ischemic attack)    2 per patient   Urinary incontinence    with sneezing, coughing    Patient Active Problem List   Diagnosis Date Noted   Nonsustained ventricular tachycardia ([place]) very short bursts; negative ischemic evaluation. 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 ->  PT evaluation suggested PVCs appear to have been completely interpolated.  Felt to be septal   CARDIAC MRI -ADENOSINE / STRESS  05/28/2020   [facility]): EF 69%. Normal LV size, thickness and function w/ NO RWMA  CO & CI 6.5 L/min & 3 L/min.      Normal RV size thickness and function.  No RV WMA or aneurysm.  No findings c/w  ARVC.  Both atria are mildly enlarged.  Trileaflet aortic valve with no evidence of stenosis.  Adenosine Stress Imaging: no  inducible myocardial ischemia OR prior infarction/scar or infiltrate. Normal AoV. Mid TR.   CATARACT EXTRACTION Right 03/2018   CATARACT EXTRACTION Left 03/2018   CHOLECYSTECTOMY     LAPAROSCOPIC TUBAL LIGATION  1986   NUCLEAR STRESS TEST  05/2014   EF 55%.  Mixed anteroseptal defect consistent with breast attenuation artifact.   TRANSTHORACIC ECHOCARDIOGRAM  08/2016   [facility]- Normal LV size and function-EF~55%.  No RWMA..  Normal LA pressures.  Normal RV function.  Trivial AR, PR and TR.  No stenoses.    ZIO PATCH CARDIAC EVENT MONITOR  02/2020   Predom Rhytym: SR - min 46 - max 113 bpm, avg 64 bpm. Rare isolated PVC & PACs. 6  short NSVT runs (8-11 beats, 134-245 bpm); 25 runs of  PAT/SVT - fastest 9 beats (200 bpm), longest 10.6 sec (130 bpm). Noted Sx w/ PAT & NSVT during daylight hours, and only once at midnight.     OB History     Gravida  2   Para  2   Term  2   Preterm      AB      Living  2      SAB      IAB      Ectopic      Multiple      Live Births              Family History  Problem Relation Age of Onset   Heart attack Father        dec age 67   Hypertension Father    Hypertension Mother    Hyperlipidemia Mother    Migraines Mother    Seizures Mother        epilepsy   Stroke Mother        TIA's   Thyroid  disease Sister        hypothyroid   Thyroid disease Sister        hypothyroid   Rheum arthritis Maternal Grandmother    Migraines Maternal Grandmother    Cancer Maternal Grandfather 64       colon ca--DEC age 48   Diabetes Maternal Grandfather    Stroke Paternal Grandmother        multiple   Thyroid disease Paternal Grandmother        goiter-hypothyroid   Breast cancer Maternal Aunt     Social History   Tobacco Use   Smoking status: Never   Smokeless tobacco: Never  Vaping Use   Vaping Use: Never used  Substance Use Topics   Alcohol use: No    Alcohol/week: 0.0 standard drinks   Drug use: No    Home Medications Prior to Admission medications   Medication Sig Start Date End Date Taking? Authorizing Provider  albuterol (VENTOLIN HFA) 108 (90 Base) MCG/ACT inhaler Inhale 1 puff into the lungs as needed. 04/07/14  Yes [provider]  Ascorbic Acid (VITAMIN C) 1000 MG tablet Take 1,000 mg by mouth daily.   Yes [provider]  b complex vitamins tablet Take by mouth daily.   Yes [provider]  folic acid (FOLVITE) 1 MG tablet Take 1 mg by mouth. 2 tablets daily 04/21/11  Yes [provider]  hydroxychloroquine (PLAQUENIL) 200 MG tablet Take 400 mg by mouth 2 (two) times daily.  06/21/14  Yes [provider]  levothyroxine (SYNTHROID, LEVOTHROID) 150 MCG tablet Take 1 tablet by mouth daily. 04/01/18  Yes [provider]  magnesium oxide (MAG-OX) 400 MG tablet Take 1 tablet (400 mg total) by mouth daily. Patient taking differently: Take 1 tablet by mouth 2 (two) times daily. 07/19/21  Yes Leonie Man, MD  meclizine (ANTIVERT) 25 MG tablet Take 1 tablet by mouth as needed. 01/24/18  Yes [provider]  metoprolol tartrate (LOPRESSOR) 25 MG tablet Take 1 tablet (25 mg total) by mouth 3 (three) times daily. If you are having a particularly bad afternoon or evening of palpitations, take an additional 1/2 tablet and then  adjust the dose at the next interval to 1/2 tablet as opposed to a full tablet) 12/04/20  Yes Leonie Man, MD  nystatin (MYCOSTATIN/NYSTOP) powder Apply 1 application topically 3 (three) times  daily. Apply to affected area for up to 7 days 06/25/21  Yes Nunzio Cobbs, MD  Vitamin D, Ergocalciferol, (DRISDOL) 1.25 MG (50000 UNIT) CAPS capsule Take 50,000 Units by mouth once a week. 04/17/21  Yes [provider]  latanoprost (XALATAN) 0.005 % ophthalmic solution Place 1 drop into both eyes daily.     [provider]  mometasone (ELOCON) 0.1 % lotion Apply 1 application topically.    [provider]    Allergies    Gluten meal, Hydrocodone, Pacerone [amiodarone], Propafenone hcl, and Verapamil  Review of Systems   Review of Systems  Constitutional:  Negative for fever.  HENT:  Negative for ear pain.   Eyes:  Negative for pain.  Respiratory:  Negative for cough.   Cardiovascular:  Negative for chest pain.  Gastrointestinal:  Positive for abdominal pain.  Genitourinary:  Positive for flank pain.  Musculoskeletal:  Negative for back pain.  Skin:  Negative for rash.  Neurological:  Negative for headaches.   Physical Exam Updated Vital Signs BP (!) 123/53   Pulse 70   Temp 98 F (36.7 C)   Resp 18   LMP 05/05/2016 (Exact Date)   SpO2 94%   Physical Exam Constitutional:      General: She is not in acute distress.    Appearance: Normal appearance.  HENT:     Head: Normocephalic.     Nose: Nose normal.  Eyes:     Extraocular Movements: Extraocular movements intact.  Cardiovascular:     Rate and Rhythm: Normal rate.  Pulmonary:     Effort: Pulmonary effort is normal.  Musculoskeletal:        General: Normal range of motion.     Cervical back: Normal range of motion.     Comments: No C or T or L-spine midline step-offs or tenderness noted.  No gross deformity or erythema noted right ankle right knee.  Tenderness with palpation to the  right knee noted.  Range of motion otherwise near normal, slightly reduced appears secondary to pain.  No varus or valgus laxity no anterior posterior laxity noted.  Neurological:     General: No focal deficit present.     Mental Status: She is alert. Mental status is at baseline.    ED Results / Procedures / Treatments   Labs (all labs ordered are listed, but only abnormal results are displayed) Labs Reviewed  COMPREHENSIVE METABOLIC PANEL - Abnormal; Notable for the following components:      Result Value   Glucose, Bld 116 (*)    All other components within normal limits  CBC - Abnormal; Notable for the following components:   WBC 13.1 (*)    All other components within normal limits    EKG None  Radiology DG Chest 2 View  Result Date: 07/21/2021 CLINICAL DATA:  Right-sided pain after fall EXAM: CHEST - 2 VIEW COMPARISON:  None. FINDINGS: Accounting for body habitus, the lungs are clear. No consolidation, features of edema, pneumothorax, or effusion. Pulmonary vascularity is normally distributed. The cardiomediastinal contours are unremarkable. No visible displaced rib fracture or acute traumatic findings of the chest wall or imaged spine. Degenerative changes are present in the imaged spine and shoulders. IMPRESSION: No acute cardiopulmonary or visible traumatic findings of the chest. Electronically Signed   By: Lovena Le M.D.   On: 07/21/2021 02:39   DG Ankle Complete Right  Result Date: 07/21/2021 CLINICAL DATA:  Pain post fall EXAM: RIGHT ANKLE - COMPLETE 3+  VIEW COMPARISON:  None. FINDINGS: Circumferential ankle swelling, most pronounced medially. No sizeable joint effusion. No acute bony abnormality. Specifically, no fracture, subluxation, or dislocation. Ankle mortise is congruent periarticular spurring. More exuberant osteophyte formation noted along the tip of the lateral malleolus. Corticated mineralization is adjacent the cuboid may reflect a combination enthesopathic  change and accessory ossicles. Bidirectional calcaneal spurring noted as well. IMPRESSION: Circumferential ankle swelling most pronounced medially. No effusion or acute osseous abnormality. Degenerative changes as above. Electronically Signed   By: Lovena Le M.D.   On: 07/21/2021 02:42   CT CHEST ABDOMEN PELVIS W CONTRAST  Result Date: 07/21/2021 CLINICAL DATA:  65 year old female status post fall with right rib and abdominal pain. EXAM: CT CHEST, ABDOMEN, AND PELVIS WITH CONTRAST TECHNIQUE: Multidetector CT imaging of the chest, abdomen and pelvis was performed following the standard protocol during bolus administration of intravenous contrast. CONTRAST:  153m OMNIPAQUE IOHEXOL 300 MG/ML  SOLN COMPARISON:  None. FINDINGS: CT CHEST FINDINGS Cardiovascular: Borderline to mild cardiomegaly. No pericardial effusion. Mildly tortuous thoracic aorta. Mild calcified plaque of the proximal great vessels. Mediastinum/Nodes: Negative. No mediastinal hematoma or lymphadenopathy. Lungs/Pleura: Major airways are patent. There is mild dependent atelectasis in both lungs. No pneumothorax, pleural effusion, pulmonary contusion or other abnormal pulmonary opacity. Musculoskeletal: Normal thoracic segmentation. Right anterior endplate osteophytes throughout much of the thoracic spine with some interbody ankylosis (e.g. T7-T8, T9-T10). No vertebral fracture identified. No right rib fracture identified. Visible shoulder osseous structures appear intact. Intact sternum. No left rib fracture. No superficial chest wall injury identified. CT ABDOMEN PELVIS FINDINGS Hepatobiliary: Evidence of hepatic steatosis. Absent gallbladder. Liver appears intact and otherwise normal. Pancreas: Negative. Spleen: Negative. Adrenals/Urinary Tract: Normal adrenal glands. Nonobstructed kidneys with symmetric renal enhancement and contrast excretion. 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 Occasional pelvic phleboliths.  No urinary calculus. Unremarkable bladder. Stomach/Bowel: Negative large bowel. Normal appendix on coronal image 51. Nondilated and negative small bowel. Possible small gastric hiatal hernia versus phrenic ampulla. Otherwise negative stomach and duodenum. No free air, free fluid, mesenteric inflammation. Vascular/Lymphatic: Mild Aortoiliac calcified atherosclerosis. Major vascular structures in the abdomen and pelvis appear to be patent. No lymphadenopathy. Reproductive: Negative. Other: No pelvic free fluid. Musculoskeletal: Transitional lumbosacral anatomy with lumbarized S1. Lumbar vertebrae appear intact. Sacrum, SI joints, pelvis and proximal femurs appear intact. No superficial injury identified. IMPRESSION: 1. No acute traumatic injury identified in the chest, abdomen, or pelvis. 2. Hepatic steatosis. 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 Questionable small hiatal hernia. Electronically Signed   By: HGenevie AnnM.D.   On: 07/21/2021 08:04   DG Knee Complete 4 Views Right  Result Date: 07/21/2021 CLINICAL DATA:  Right-sided pain after fall EXAM: RIGHT KNEE - COMPLETE 4+ VIEW COMPARISON:  None. FINDINGS: Mild lower extremity edema. Some more focal soft tissue swelling is noted anterior laterally at the level of the knee. Trace effusion. No acute bony abnormality. Specifically, no fracture, subluxation, or dislocation. Background of mild tricompartmental degenerative change. IMPRESSION: Mild lower extremity edema with more focal swelling anterior laterally at the level of the knee, correlate for contusive change. Trace suprapatellar effusion. No acute osseous abnormality. Background of mild degenerative change. Electronically Signed   By: PLovena LeM.D.   On: 07/21/2021 02:41    Procedures Procedures   Medications Ordered in ED Medications  acetaminophen (TYLENOL) tablet 650 mg (650 mg Oral Given 07/21/21 0045)  iohexol (OMNIPAQUE) 300 MG/ML solution 100 mL (100 mLs  Intravenous Contrast  Given 07/21/21 0731)    ED Course  I have reviewed the triage vital signs and the nursing notes.  Pertinent labs & imaging results that were available during my care of the patient were reviewed by me and considered in my medical decision making (see chart for details).    MDM Rules/Calculators/A&P                           Labs unremarkable mild white count elevation.  Imaging is unremarkable no fracture dislocation or f acute pathology noted.  Patient given Tylenol for pain. Will recommend outpatient follow-up with her doctor within the week.  Recommending rest at home.  Recommending immediate return for worsening symptoms fevers vomiting pain or any additional concerns.    Final Clinical Impression(s) / ED Diagnoses Final diagnoses:  Fall  Fall, initial encounter  Acute pain of right knee    Rx / DC Orders ED Discharge Orders     None        Luna Fuse, MD 07/21/21 [id number]

## 2021-07-21 NOTE — ED Provider Notes (Signed)
Emergency Medicine Provider Triage Evaluation Note  Maria Meza , a 65 y.o. female  was evaluated in triage.  Pt complains of fall.  The patient was not walking through [employer] earlier tonight when the toe of her shoe got caught on the ground, which caused her to fall onto her right side.  She did not hit her head.  No loss of consciousness, numbness, weakness, visual changes, nausea, or vomiting.  She states that the fall knocked to the wind out of her and she has been having pain in her right ribs since the fall.  Pain is worse with breathing.  No other known aggravating or alleviating factors.  She is also endorsing a right-sided flank and abdominal pain that began after the fall.  She was unable to stand from the fall and has been unable to bear weight on her right leg since the injury.  She states that she partially landed on her right knee and is endorsing right knee and ankle pain.  She denies right hip pain, back pain, left leg pain.  She does have a skin tear to her left elbow.  Her family took her to the ED and [facility], but the family left due to long wait times.  No treatment prior to arrival.  Review of Systems  Positive: Chest pain, abdominal pain, knee pain, ankle pain, wound to left elbow  Negative: Headache, neck pain, shortness of breath, vomiting, syncope, visual changes, numbness, weakness  Physical Exam  LMP 05/05/2016 (Exact Date)  Gen:   Awake, no distress   Resp:  Normal effort  MSK:   Moves extremities without difficulty  Other:  Superficial abrasion to the left elbow.  She is diffusely tender to palpation to the right ribs.  There is right upper and lower abdominal tenderness.  No obvious overlying wounds to the skin of the right abdomen or chest.  Tender to palpation to the right knee and ankle.  Her right hip is nontender.  No obvious leg shortening.  Sensation is intact to the bilateral upper and lower extremities.  No trauma noted to the head.  No tenderness  to the spine.  Medical Decision Making  Medically screening exam initiated at 12:44 AM.  Appropriate orders placed.  Dorice Stiggers was informed that the remainder of the evaluation will be completed by another provider, this initial triage assessment does not replace that evaluation, and the importance of remaining in the ED until their evaluation is complete.  65 year old female who presents to the ED after mechanical fall.  Tylenol given for pain control in the ED per patient request.  Will order basic labs and imaging as I am concerned that she has a rib fracture.  She will require further work-up and evaluation in the emergency department.   Joanne Gavel, PA-C 07/21/21 0049    Luna Fuse, MD 07/21/21 [phone]

## 2021-07-21 NOTE — ED Triage Notes (Signed)
Pt c/o mechanical fall in [place], landed on R side. Pt endorses R rib, knee, ankle pain. Pt also endorses R abdominal tenderness w palpation. Pt denies blood thinners, but was unable to get up on her own & unable to place weight. Pt denies hitting head, vision intact, denies neck/back pain.

## 2021-07-21 NOTE — Discharge Instructions (Addendum)
Call your primary care doctor or specialist as discussed in the next 2-3 days.   Return immediately back to the ER if:  Your symptoms worsen within the next 12-24 hours. You develop new symptoms such as new fevers, persistent vomiting, new pain, shortness of breath, or new weakness or numbness, or if you have any other concerns.  

## 2021-07-21 NOTE — ED Notes (Signed)
Patient transported to CT 

## 2021-08-26 ENCOUNTER — Other Ambulatory Visit: Payer: Self-pay | Admitting: Obstetrics and Gynecology

## 2021-08-26 DIAGNOSIS — Z1231 Encounter for screening mammogram for malignant neoplasm of breast: Secondary | ICD-10-CM

## 2021-09-06 MED ORDER — MAGNESIUM OXIDE 400 MG PO TABS: 1.0000 | ORAL_TABLET | Freq: Two times a day (BID) | ORAL | 3 refills | Status: DC

## 2021-09-29 ENCOUNTER — Other Ambulatory Visit: Payer: Self-pay

## 2021-09-29 ENCOUNTER — Ambulatory Visit
Admission: RE | Admit: 2021-09-29 | Discharge: 2021-09-29 | Disposition: A | Discharge status: Home / Self Care | Payer: Medicare Other | Source: Ambulatory Visit | Attending: Obstetrics and Gynecology | Admitting: Obstetrics and Gynecology

## 2021-09-29 DIAGNOSIS — Z1231 Encounter for screening mammogram for malignant neoplasm of breast: Secondary | ICD-10-CM

## 2022-01-25 ENCOUNTER — Encounter: Payer: Self-pay | Admitting: Cardiology

## 2022-01-25 NOTE — Telephone Encounter (Signed)
Spoke to patient she stated she has been having chest tightness and dizziness off and on for the past 1 week.No chest tightness at present.Stated her B/P has been elevated too.Appointment scheduled with Laurann Montana NP 01/26/22 at 3:35 pm at [facility] location.Advised to bring a list of all medications.

## 2022-01-25 NOTE — Telephone Encounter (Signed)
Seems appropriate for her to be seen.  I have not seen her in almost a year.  Blood pressure levels did not look all that bad though.  Glenetta Hew, MD

## 2022-01-26 ENCOUNTER — Ambulatory Visit (INDEPENDENT_AMBULATORY_CARE_PROVIDER_SITE_OTHER): Payer: Medicare Other

## 2022-01-26 ENCOUNTER — Other Ambulatory Visit: Payer: Self-pay

## 2022-01-26 ENCOUNTER — Ambulatory Visit (HOSPITAL_BASED_OUTPATIENT_CLINIC_OR_DEPARTMENT_OTHER): Payer: Medicare Other | Admitting: Nurse Practitioner

## 2022-01-26 ENCOUNTER — Ambulatory Visit (INDEPENDENT_AMBULATORY_CARE_PROVIDER_SITE_OTHER): Payer: Medicare Other | Admitting: Family

## 2022-01-26 VITALS — BP 116/64 | HR 68 | Ht 65.0 in | Wt 259.0 lb

## 2022-01-26 DIAGNOSIS — R002 Palpitations: Secondary | ICD-10-CM

## 2022-01-26 DIAGNOSIS — I471 Supraventricular tachycardia, unspecified: Secondary | ICD-10-CM

## 2022-01-26 DIAGNOSIS — R42 Dizziness and giddiness: Secondary | ICD-10-CM

## 2022-01-26 DIAGNOSIS — I493 Ventricular premature depolarization: Secondary | ICD-10-CM

## 2022-01-26 DIAGNOSIS — Z9989 Dependence on other enabling machines and devices: Secondary | ICD-10-CM

## 2022-01-26 DIAGNOSIS — I4729 Other ventricular tachycardia: Secondary | ICD-10-CM | POA: Diagnosis not present

## 2022-01-26 DIAGNOSIS — G4733 Obstructive sleep apnea (adult) (pediatric): Secondary | ICD-10-CM

## 2022-01-26 HISTORY — PX: OTHER SURGICAL HISTORY: SHX169

## 2022-01-26 NOTE — Progress Notes (Signed)
Office Visit    Patient Name: [name] Date of Encounter: 01/27/2022  PCP:  Celene Squibb, [place]  Cardiologist:  Glenetta Hew, MD  Advanced Practice Provider:  No care team member to display Electrophysiologist:  None      Chief Complaint    Maria Meza is a 66 y.o. female with a hx of  nonsustained VT, palpitations, PAT, PVC, OSA, PSVT presents today for chest tightness, dizziness  Past Medical History    Past Medical History:  Diagnosis Date   Absolute anemia    No longer on iron supplementation.   Adult celiac disease 2018   Anxiety    Arrhythmia 06/2016   Event monitor has shown PVCs, PSVT at 1 short burst of NSVT.;  Event monitor from March 2021: 6 short runs of wide-complex tachycardia and 25 runs of PSVT--intolerant of high-dose beta-blocker because of bradycardia.   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  (ANA positive, dsDNA negative)   Dysmenorrhea    Glaucoma    History of COVID-19 02/19/2020   IBS (irritable bowel syndrome)    Morbid obesity with BMI of 40.0-44.9, adult ([place]) 09/2020   BMI 43.07   MRSA infection 4-end-21   on abdomen   OSA on CPAP    Thyroid disease    hypothyroidism   TIA (transient ischemic attack)    2 per patient   Urinary incontinence    with sneezing, coughing   Past Surgical History:  Procedure Laterality Date   CARDIAC EVENT MONITOR  06/2016   [facility]) Noted PVCs, PSVT and NSVT (1 episode of 20 beats). ->  PT evaluation suggested PVCs appear to have been completely interpolated.  Felt to be septal   CARDIAC MRI -ADENOSINE / STRESS  05/28/2020   [facility]): EF 69%. Normal LV size, thickness and function w/ NO RWMA  CO & CI 6.5 L/min & 3 L/min.      Normal RV size thickness and function.  No RV WMA or aneurysm.  No  findings c/w  ARVC.  Both atria are mildly enlarged.  Trileaflet aortic valve with no evidence of stenosis.  Adenosine Stress Imaging: no  inducible myocardial ischemia OR prior infarction/scar or infiltrate. Normal AoV. Mid TR.   CATARACT EXTRACTION Right 03/2018   CATARACT EXTRACTION Left 03/2018   CHOLECYSTECTOMY     LAPAROSCOPIC TUBAL LIGATION  1986   NUCLEAR STRESS TEST  05/2014   EF 55%.  Mixed anteroseptal defect consistent with breast attenuation artifact.   TRANSTHORACIC ECHOCARDIOGRAM  08/2016   [facility]- Normal LV size and function-EF~55%.  No RWMA..  Normal LA pressures.  Normal RV function.  Trivial AR, PR and TR.  No stenoses.    ZIO PATCH CARDIAC EVENT MONITOR  02/2020   [facility] Rhytym: SR - min 46 - max 113 bpm, avg 64 bpm. Rare isolated PVC & PACs. 6 short NSVT runs (8-11 beats, 134-245 bpm); 25 runs of  PAT/SVT - fastest 9 beats (200 bpm), longest 10.6 sec (130 bpm). Noted Sx w/ PAT & NSVT during daylight hours, and only once at midnight.    Allergies  Allergies  Allergen Reactions   Gluten Meal     Pt has celiac disease.    Hydrocodone Nausea And Vomiting   Pacerone [Amiodarone] Other (See Comments)    "  does not tolerate"--slows heart rate   Propafenone Hcl     Caused very low HR   Verapamil     Fatigue, constipation    History of Present Illness    Maria Meza is a 66 y.o. female with a hx of nonsustained VT, palpitations, PAT, PVC, OSA, PSVT last seen 03/2021 by [name].  She previously transferred her care from [name] at [facility] cardiology/EP to [name].  Holter 2018 with PVC burden less than 1%.  Nuclear stress test 05/2014 EF 65%, fixed anterior septal defect consistent with artifact.  She was previously offered VT ablation at [place] declined.  Echo 2017 normal LVEF, no LVH, no significant valvular abnormalities.  She had a ZIO monitor placed by primary care due to palpitations and syncope 03/2020 which were worse after COVID-19 infection.  Cardiac MRI 05/2020  LVEF 69%, no RWMA, RV normal, bialtearl atria mildly dilated, mild TR - adenosin stress with no ischemia. Her metoprolol was eventually transitioned from 37.5 mg twice daily to 25 mg 3 times daily.  Previously bradycardic on higher doses of beta-blockers.  Verapamil, amiodarone poorly tolerated previously.  She sent a MyChart message noting that over the week prior she was having episodes of dizziness and palpitations.  She also felt diaphoretic and short of breath.  Blood pressure ranged from 123/79-1 47/81.  Heart rate from 50s to 80s.  She presents today for follow-up. When describing her dizziness tells me her head felt like it had water in it and as if she was spinning (not the room). She noted midsternal pressure with her palpitations. She did not take any extra Metoprolol. No change with movement position changes. She drinks one cup of coffee then water throughout the day. She reports no exertional dyspnea nor exertional chest pain.   EKGs/Labs/Other Studies Reviewed:   The following studies were reviewed today:  Cardiac MRI 05/28/20 Adenosine stress cardiac MRI with and without contrast was performed on a 3T scanner to evaluate myocardial  morphology, function, viability, and assess for inducible myocardial ischemia in a 66 y/o female patient  with a history of symptomatic PVCs being considered for possible VT ablation.   Cardiac MRI:   1. The left ventricle is normal in cavity size and wall thickness. Global systolic function is normal with  an LV ejection fraction calculated at 69%. There are no regional wall motion abnormalities.   2. The right ventricle is normal in cavity size, wall thickness, and systolic function. There are no RV  wall motion abnormalities or aneurysms. There are no findings consistent with the proposed modified MRI  task force criteria for ARVC Beverely Low et al. Eur Heart J 2010).   3. Both atria are mildly enlarged.   4. The aortic valve is  trileaflet in morphology. There is no significant aortic valve stenosis or  regurgitation. There is mild tricuspid regurgitation. There are no other significant valvular lesions.   5. Delayed enhancement imaging demonstrates no evidence of myocardial infarction, scar or infiltrative  disease.   6. Adenosine stress perfusion imaging demonstrates no evidence of inducible myocardial ischemia.   7. No intracardiac thrombus visualized.   EKG:  EKG is ordered today.  The ekg ordered today demonstrates NSR 68 bpm with low voltage QRS and no acute ST/T wave changes.   Recent Labs: 07/21/2021: ALT 33 01/26/2022: BUN 18; Creatinine, Ser 1.02; Hemoglobin 14.2; Magnesium 2.1; Platelets 347; Potassium 4.6; Sodium 143; TSH 2.260  Recent Lipid Panel No results found for:  CHOL, TRIG, HDL, CHOLHDL, VLDL, LDLCALC, LDLDIRECT   Home Medications   Current Meds  Medication Sig   albuterol (VENTOLIN HFA) 108 (90 Base) MCG/ACT inhaler Inhale 1 puff into the lungs as needed.   Ascorbic Acid (VITAMIN C) 1000 MG tablet Take 1,000 mg by mouth daily.   b complex vitamins tablet Take by mouth daily.   folic acid (FOLVITE) 1 MG tablet Take 1 mg by mouth. 2 tablets daily   hydroxychloroquine (PLAQUENIL) 200 MG tablet Take 400 mg by mouth 2 (two) times daily.    latanoprost (XALATAN) 0.005 % ophthalmic solution Place 1 drop into both eyes daily.    levothyroxine (SYNTHROID, LEVOTHROID) 150 MCG tablet Take 1 tablet by mouth daily.   magnesium oxide (MAG-OX) 400 MG tablet Take 1 tablet (400 mg total) by mouth 2 (two) times daily.   meclizine (ANTIVERT) 25 MG tablet Take 1 tablet by mouth as needed.   metoprolol tartrate (LOPRESSOR) 25 MG tablet Take 1 tablet (25 mg total) by mouth 3 (three) times daily. If you are having a particularly bad afternoon or evening of palpitations, take an additional 1/2 tablet and then adjust the dose at the next interval to 1/2 tablet as opposed to a full tablet)   mometasone (ELOCON) 0.1 %  lotion Apply 1 application topically.   nystatin (MYCOSTATIN/NYSTOP) powder Apply 1 application topically 3 (three) times daily. Apply to affected area for up to 7 days   Vitamin D, Ergocalciferol, (DRISDOL) 1.25 MG (50000 UNIT) CAPS capsule Take 50,000 Units by mouth once a week.     Review of Systems      All other systems reviewed and are otherwise negative except as noted above.  Physical Exam    VS:  BP 116/64    Pulse 68    Ht 5' 5"  (1.651 m)    Wt 259 lb (117.5 kg)    LMP 05/05/2016 (Exact Date)    BMI 43.10 kg/m  , BMI Body mass index is 43.1 kg/m.  Wt Readings from Last 3 Encounters:  01/26/22 259 lb (117.5 kg)  06/25/21 262 lb (118.8 kg)  04/05/21 266 lb (120.7 kg)     GEN: Well nourished, well developed, in no acute distress. HEENT: normal. Neck: Supple, no JVD, carotid bruits, or masses. Cardiac: RRR, no murmurs, rubs, or gallops. No clubbing, cyanosis, edema.  Radials/PT 2+ and equal bilaterally.  Respiratory:  Respirations regular and unlabored, clear to auscultation bilaterally. GI: Soft, nontender, nondistended. MS: No deformity or atrophy. Skin: Warm and dry, no rash. Neuro:  Strength and sensation are intact. Psych: Normal affect.  Assessment & Plan    Palpitations / PVC / SVT / VT / Dizziness - 1 week history of dizziness, palpitations associated with midsternal chest discomfort at rest. Feels similar to previous palpitations. No exertional chest pain nor dyspnea. No indication for ischemic evaluation.  Update thyroid panel, CBC, magnesium, BMP to rule out electrolyte/thyroid abnormality, anemia.  14 day ZIO monitor placed in clinic Continue Lopressor 58m TID. Encouraged to utilized additional half tablet PRN.   OSA - CPAP compliance encouraged. Endorses compliance.   Chest pain - Associated with palpitations and self resolved. EKG with no acute ST/T wave changes. No exertional symptoms consistent with angina. Stress MRI 05/2020 with no ischemia. No known  history of coronary disease. If chest pain recurs, consider ischemic eval such as cardiac CTA.   Disposition: Follow up  in April as scheduled  with DGlenetta Hew MD  Signed, CLoel Dubonnet  NP 01/27/2022, 8:34 PM [place]

## 2022-01-26 NOTE — Patient Instructions (Signed)
Medication Instructions:  Your Physician recommend you continue on your current medication as directed.    *If you need a refill on your cardiac medications before your next appointment, please call your pharmacy*   Lab Work: Your physician recommends that you return for lab work today- Thyroid Panel, Mag, CBC, BMET  If you have labs (blood work) drawn today and your tests are completely normal, you will receive your results only by: MyChart Message (if you have MyChart) OR A paper copy in the mail If you have any lab test that is abnormal or we need to change your treatment, we will call you to review the results.   Testing/Procedures: Your physician has requested that you have an echocardiogram. Echocardiography is a painless test that uses sound waves to create images of your heart. It provides your doctor with information about the size and shape of your heart and how well your hearts chambers and valves are working. This procedure takes approximately one hour. There are no restrictions for this procedure. [place] has recommended that you wear a Zio monitor.   This monitor is a medical device that records the hearts electrical activity. Doctors most often use these monitors to diagnose arrhythmias. Arrhythmias are problems with the speed or rhythm of the heartbeat. The monitor is a small device applied to your chest. You can wear one while you do your normal daily activities. While wearing this monitor if you have any symptoms to push the button and record what you felt. Once you have worn this monitor for the period of time provider prescribed (Usually 14 days), you will return the monitor device in the postage paid box. Once it is returned they will download the data collected and provide [place] with a report which the provider will then review and we will call you with those results. Important tips:  Avoid showering during the first 24 hours of  wearing the monitor. Avoid excessive sweating to help maximize wear time. Do not submerge the device, no hot tubs, and no swimming pools. Keep any lotions or oils away from the patch. After 24 hours you may shower with the patch on. Take brief showers with your back facing the shower head.  Do not remove patch once it has been placed because that will interrupt data and decrease adhesive wear time. Push the button when you have any symptoms and write down what you were feeling. Once you have completed wearing your monitor, remove and place into box which has postage paid and place in your outgoing mailbox.  If for some reason you have misplaced your box then call our office and we can provide another box and/or mail it off for you.    Follow-Up: At [facility], you and your health needs are our priority.  As part of our continuing mission to provide you with exceptional heart care, we have created designated Provider Care Teams.  These Care Teams include your primary Cardiologist (physician) and Advanced Practice Providers (APPs -  Physician Assistants and Nurse Practitioners) who all work together to provide you with the care you need, when you need it.  We recommend signing up for the patient portal called "MyChart".  Sign up information is provided on this After Visit Summary.  MyChart is used to connect with patients for Virtual Visits (Telemedicine).  Patients are able to view lab/test results, encounter notes, upcoming appointments, etc.  Non-urgent messages can be sent to your provider as  well.   To learn more about what you can do with MyChart, go to [email or web address].    Your next appointment:   Follow up as scheduled with [name]   Other Instructions None today!

## 2022-01-27 ENCOUNTER — Encounter (HOSPITAL_BASED_OUTPATIENT_CLINIC_OR_DEPARTMENT_OTHER): Payer: Self-pay | Admitting: Family

## 2022-01-27 LAB — BASIC METABOLIC PANEL
BUN/Creatinine Ratio: 18 (ref 12–28)
BUN: 18 mg/dL (ref 8–27)
CO2: 25 mmol/L (ref 20–29)
Calcium: 10.6 mg/dL — ABNORMAL HIGH (ref 8.7–10.3)
Chloride: 102 mmol/L (ref 96–106)
Creatinine, Ser: 1.02 mg/dL — ABNORMAL HIGH (ref 0.57–1.00)
Glucose: 115 mg/dL — ABNORMAL HIGH (ref 70–99)
Potassium: 4.6 mmol/L (ref 3.5–5.2)
Sodium: 143 mmol/L (ref 134–144)
eGFR: 61 mL/min/{1.73_m2} (ref >59)

## 2022-01-27 LAB — CBC
Hematocrit: 42.2 % (ref 34.0–46.6)
Hemoglobin: 14.2 g/dL (ref 11.1–15.9)
MCH: 30.7 pg (ref 26.6–33.0)
MCHC: 33.6 g/dL (ref 31.5–35.7)
MCV: 91 fL (ref 79–97)
Platelets: 347 10*3/uL (ref 150–450)
RBC: 4.63 x10E6/uL (ref 3.77–5.28)
RDW: 13.1 % (ref 11.7–15.4)
WBC: 8.8 10*3/uL (ref 3.4–10.8)

## 2022-01-27 LAB — THYROID PANEL WITH TSH
Free Thyroxine Index: 2 (ref 1.2–4.9)
T3 Uptake Ratio: 25 % (ref 24–39)
T4, Total: 8 ug/dL (ref 4.5–12.0)
TSH: 2.26 u[IU]/mL (ref 0.450–4.500)

## 2022-01-27 LAB — MAGNESIUM: Magnesium: 2.1 mg/dL (ref 1.6–2.3)

## 2022-02-09 ENCOUNTER — Encounter (HOSPITAL_BASED_OUTPATIENT_CLINIC_OR_DEPARTMENT_OTHER): Payer: Self-pay

## 2022-02-13 ENCOUNTER — Other Ambulatory Visit: Payer: Self-pay | Admitting: Cardiology

## 2022-02-14 ENCOUNTER — Other Ambulatory Visit: Payer: Self-pay

## 2022-02-14 ENCOUNTER — Ambulatory Visit (INDEPENDENT_AMBULATORY_CARE_PROVIDER_SITE_OTHER): Payer: Medicare Other

## 2022-02-14 DIAGNOSIS — R002 Palpitations: Secondary | ICD-10-CM

## 2022-02-14 DIAGNOSIS — R079 Chest pain, unspecified: Secondary | ICD-10-CM | POA: Diagnosis not present

## 2022-02-14 DIAGNOSIS — I4729 Other ventricular tachycardia: Secondary | ICD-10-CM | POA: Diagnosis not present

## 2022-02-14 DIAGNOSIS — R42 Dizziness and giddiness: Secondary | ICD-10-CM

## 2022-02-14 DIAGNOSIS — I493 Ventricular premature depolarization: Secondary | ICD-10-CM

## 2022-02-14 DIAGNOSIS — I471 Supraventricular tachycardia: Secondary | ICD-10-CM

## 2022-02-14 DIAGNOSIS — G4733 Obstructive sleep apnea (adult) (pediatric): Secondary | ICD-10-CM

## 2022-02-14 HISTORY — PX: TRANSTHORACIC ECHOCARDIOGRAM: SHX275

## 2022-02-14 LAB — ECHOCARDIOGRAM COMPLETE
AR max vel: 2.98 cm2
AV Area VTI: 3.06 cm2
AV Area mean vel: 2.82 cm2
AV Mean grad: 5 mmHg
AV Peak grad: 10.5 mmHg
Ao pk vel: 1.62 m/s
Area-P 1/2: 3.28 cm2
Calc EF: 67.4 %
S' Lateral: 3.3 cm
Single Plane A2C EF: 66.5 %
Single Plane A4C EF: 67.4 %

## 2022-02-15 ENCOUNTER — Telehealth (HOSPITAL_BASED_OUTPATIENT_CLINIC_OR_DEPARTMENT_OTHER): Payer: Self-pay

## 2022-02-15 NOTE — Telephone Encounter (Addendum)
Called results to patient and left results on VM (ok per DPR), instructions left to call office back if patient has any questions!   ----- Message from Loel Dubonnet, NP sent at 02/15/2022  8:26 AM EST ----- Echo shows normal heart pumping function. No significant valvular abnormalities. Aorta is borderline dilated. Recommend continuing optimal blood pressure control to prevent progression. Overall good result!

## 2022-04-13 ENCOUNTER — Ambulatory Visit (INDEPENDENT_AMBULATORY_CARE_PROVIDER_SITE_OTHER): Payer: Medicare Other | Admitting: Cardiology

## 2022-04-13 ENCOUNTER — Encounter: Payer: Self-pay | Admitting: Cardiology

## 2022-04-13 VITALS — BP 128/68 | HR 55 | Ht 65.0 in | Wt 262.6 lb

## 2022-04-13 DIAGNOSIS — I471 Supraventricular tachycardia: Secondary | ICD-10-CM

## 2022-04-13 DIAGNOSIS — I4729 Other ventricular tachycardia: Secondary | ICD-10-CM

## 2022-04-13 DIAGNOSIS — I493 Ventricular premature depolarization: Secondary | ICD-10-CM

## 2022-04-13 MED ORDER — METOPROLOL TARTRATE 25 MG PO TABS: ORAL_TABLET | ORAL | 6 refills | Status: DC

## 2022-04-13 MED ORDER — VERAPAMIL HCL ER 180 MG PO TBCR: 180.0000 mg | EXTENDED_RELEASE_TABLET | Freq: Every day | ORAL | 6 refills | Status: DC

## 2022-04-13 NOTE — Patient Instructions (Addendum)
Medication Instructions:  ? ?You will be weaning off  decrease Metoprolol 25 mg one tablet twice a day for 7days then decrease to 12.5 mg ( 1/2 tablet of 25 mg ) twice a day for 7 days then stop. ? Once you start the 12.5 mg of Metoprolol twice a day , then start taking Verapamil 180 mg once daily. ? ? Continue taking the verapamil after stopping Metoprolol ? ? You may use 12.5 mg to 25 mg  of metoprolol if you have a break through in your fast heart rate. ? ?*If you need a refill on your cardiac medications before your next appointment, please call your pharmacy* ? ? ?Lab Work: ?Not needed ?If you have labs (blood work) drawn today and your tests are completely normal, you will receive your results only by: ?MyChart Message (if you have MyChart) OR ?A paper copy in the mail ?If you have any lab test that is abnormal or we need to change your treatment, we will call you to review the results. ? ? ?Testing/Procedures: ? ?Not needed ? ?Follow-Up: ?At [facility], you and your health needs are our priority.  As part of our continuing mission to provide you with exceptional heart care, we have created designated Provider Care Teams.  These Care Teams include your primary Cardiologist (physician) and Advanced Practice Providers (APPs -  Physician Assistants and Nurse Practitioners) who all work together to provide you with the care you need, when you need it. ? ?  ? ?Your next appointment:   ?2 month(s) ? ?The format for your next appointment:   ?In Person ? ?Provider:   ?Glenetta Hew, MD  ? ? ?

## 2022-04-13 NOTE — Progress Notes (Signed)
Primary Care Provider: Celene Squibb, MD Cardiologist: Glenetta Hew, MD Electrophysiologist: None  Clinic Note: Chief Complaint  Patient presents with   Follow-up    Echo and monitor results   Palpitations    Monitor reviewed.   ===================================  ASSESSMENT/PLAN   Problem List Items Addressed This Visit       Cardiology Problems   Nonsustained ventricular tachycardia ([place]) very short bursts; negative ischemic evaluation. (Chronic)    Current monitor suggested a V. tach run that was really a narrow complex run-May be fusion beats.       Relevant Medications   verapamil (CALAN-SR) 180 MG CR tablet   metoprolol tartrate (LOPRESSOR) 25 MG tablet   Symptomatic PVCs (Chronic)    She was symptomatic for both PACs and PVCs.  Unfortunately metoprolol seems to be wearing off.  We will give a trial run of using verapamil as the underlying rate control agent with PRN beta-blocker.  See SVT section.       Relevant Medications   verapamil (CALAN-SR) 180 MG CR tablet   metoprolol tartrate (LOPRESSOR) 25 MG tablet   PSVT (paroxysmal supraventricular tachycardia) (HCC) - Primary (Chronic)    SVT/PAT runs from a 5 to 10 seconds at the most.  Not really AVNRT type SVTs.  Not long enough to use vagal maneuvers. She was previously doing very well with TID dosing of beta-blocker, but has become fatigued and maybe had some tachyphylaxis with it.  Plan: Convert from metoprolol to verapamil  Decrease to 25 mg twice daily for 7 days and then 12.5 mg twice daily for 7 days, then stop. Once taking 12.5 mg twice daily-add verapamil 180 mg daily.  Going forward, okay to use 1/2-1 full tablet of metoprolol for breakthrough fast heart rates.  Continue to monitor and avoid triggers. Adequate hydration.       Relevant Medications   verapamil (CALAN-SR) 180 MG CR tablet   metoprolol tartrate (LOPRESSOR) 25 MG tablet    ===================================  HPI:     Maria Meza is a 66 y.o. female with a h/o Symptoatic PVCs, Short Bursts of PSVT/PAT (on low dose Beta Blocker) who presents today for 2 month f/u at the request of Celene Squibb, MD.  She had been followed by [name] ([facility]) up until 2018 but wanted to transfer care here to [facility].  I last saw her on April 05, 2021 -> she is doing much better.  Since switching the metoprolol dosing to 3 times a day from twice a day, the palpitations became less prominent and she had less bradycardia.  She still notices intermittent episodes but less frequent and lasting less time.  She had 1 notable episode just prior to the visit that lasted about 24 hours where she felt her heart rate going up and down intermittently.  When that happens she may get dizzy and lightheaded.  But no syncope or near syncope.  Also with palpitation associated with fatigue.  Palpitations tend to occur when her thyroid levels are not well controlled.  Thankfully, with the TID dosing of beta-blocker, chronotropic incompetence has not been an issue.  Maria Meza was recently seen by Laurann Montana, NP on January 26, 2022 with complaints of increased frequency of palpitations, chest tightness and dizziness along with diaphoresis and dyspnea..  BP ranges from 120/79-150/81 with heart rates ranging from the 50s to 80s. => She described the dizziness as feeling like she had water in her head and she  was feeling room.  She did not take extra metoprolol.  No exertional dyspnea or chest pain. Thyroid panel CBC and magnesium levels check follow-up BMP. 14-day Zio patch monitor Continued TID Lopressor 25 mg with as needed additional dose. Felt that chest pain was not anginal in nature.  Therefore did not consider stress test.  Recent Hospitalizations: None  Reviewed  CV studies:    The following studies were reviewed today: (if available, images/films reviewed: From Epic Chart or Care Everywhere) TTE 02/14/2022: Difficult  images.  Normal EF 60 to 65%.  No RWMA.  Normal diastolic parameters.  Normal RV size and function.  Mild to moderate LA dilation.  Normal aortic and mitral valves.  Acing aorta measured roughly 38 mm. 14-day Zio Patch monitor worn for 12 days, 19 hours. February 2023 Predominant rhythm sinus rhythm: Minimum heart rate 46 bpm, maximum heart rate 99 bpm with an average of 61 bpm. Rare PACs and PVCs with rare couplets and only PAC triplets. No bigeminy or trigeminy.--These were what was noted on patient diary. 1 4 beat run of what looks like a narrow complex tachycardia read by the machine as ventricular tachycardia. Max rate 148 bpm. 35 atrial runs: Fastest interval was 9 beats at a rate of 193 bpm. Longest was 22 beats lasting 12.1 seconds at an average rate of 108 bpm.:  For the most part, symptoms are noted with PACs or PVCs in sinus rhythm, 3-4 atrial runs were also noted.  Interval History:   Maria Meza returns here today for close follow-up after echocardiogram and Zio patch monitor. She says that she is still has some palpitation episodes.  He get worse when she goes up steps or does activity.  He generally feels bad when they occur but did not last very long.  She says when they happen she feels somewhat dizzy and has sweating sensation.  Significant happen up to 3 times a minute or can last up to 2 to 3 minutes. She has spells where she feels that she is being punched in the chest with her heartbeats. Starting nurse fatigued and did not really have the benefit by taking extra dose of metoprolol that she had before.  CV Review of Symptoms (Summary) Cardiovascular ROS: positive for - irregular heartbeat, palpitations, rapid heart rate, and associated with dizziness, fatigue, dyspnea, and chest pain negative for - dyspnea on exertion, edema, orthopnea, paroxysmal nocturnal dyspnea, shortness of breath, or exertional chest pain; syncope or near syncope, TIA/amaurosis fugax or  claudication.  REVIEWED OF SYSTEMS   Review of Systems  Constitutional:  Positive for malaise/fatigue (Notably when she has palpitations.). Negative for weight loss.  HENT:  Negative for congestion and nosebleeds.   Respiratory:  Negative for cough.   Cardiovascular:        Per HPI  Gastrointestinal:  Negative for blood in stool and melena.  Genitourinary:  Negative for hematuria.  Neurological:  Positive for dizziness (Per HPI). Negative for loss of consciousness.  Psychiatric/Behavioral:  The patient is nervous/anxious (When she has these episodes.).    I have reviewed and (if needed) personally updated the patient's problem list, medications, allergies, past medical and surgical history, social and family history.   PAST MEDICAL HISTORY   Past Medical History:  Diagnosis Date   Absolute anemia    No longer on iron supplementation.   Adult celiac disease 2018   Anxiety    Arrhythmia 06/2016   Event monitor has shown PVCs, PSVT at 1 short burst  of NSVT.;  Event monitor from March 2021: 6 short runs of wide-complex tachycardia and 25 runs of PSVT--intolerant of high-dose beta-blocker because of bradycardia.   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  (ANA positive, dsDNA negative)   Dysmenorrhea    Glaucoma    History of COVID-19 02/19/2020   IBS (irritable bowel syndrome)    Morbid obesity with BMI of 40.0-44.9, adult ([place]) 09/2020   BMI 43.07   MRSA infection 4-end-21   on abdomen   OSA on CPAP    Thyroid disease    hypothyroidism   TIA (transient ischemic attack)    2 per patient   Urinary incontinence    with sneezing, coughing    PAST SURGICAL HISTORY   Past Surgical History:  Procedure Laterality Date   CARDIAC EVENT MONITOR  06/2016   [facility]) Noted PVCs, PSVT and NSVT (1 episode of 20  beats). ->  PT evaluation suggested PVCs appear to have been completely interpolated.  Felt to be septal   CARDIAC MRI -ADENOSINE / STRESS  05/28/2020   [facility]): EF 69%. Normal LV size, thickness and function w/ NO RWMA  CO & CI 6.5 L/min & 3 L/min.      Normal RV size thickness and function.  No RV WMA or aneurysm.  No findings c/w  ARVC.  Both atria are mildly enlarged.  Trileaflet aortic valve with no evidence of stenosis.  Adenosine Stress Imaging: no  inducible myocardial ischemia OR prior infarction/scar or infiltrate. Normal AoV. Mid TR.   CATARACT EXTRACTION Right 03/2018   CATARACT EXTRACTION Left 03/2018   CHOLECYSTECTOMY     LAPAROSCOPIC TUBAL LIGATION  1986   NUCLEAR STRESS TEST  05/2014   EF 55%.  Mixed anteroseptal defect consistent with breast attenuation artifact.   TRANSTHORACIC ECHOCARDIOGRAM  08/2016   [facility]- Normal LV size and function-EF~55%.  No RWMA..  Normal LA pressures.  Normal RV function.  Trivial AR, PR and TR.  No stenoses.    TRANSTHORACIC ECHOCARDIOGRAM  02/14/2022   Difficult images.  Normal EF 60 to 65%.  No RWMA.  Normal diastolic parameters.  Normal RV size and function.  Mild to moderate LA dilation.  Normal aortic and mitral valves.  Acing aorta measured roughly 38 mm.   ZIO PATCH CARDIAC EVENT MONITOR  02/2020   [facility] Rhytym: SR - min 46 - max 113 bpm, avg 64 bpm. Rare isolated PVC & PACs. 6 short NSVT runs (8-11 beats, 134-245 bpm); 25 runs of  PAT/SVT - fastest 9 beats (200 bpm), longest 10.6 sec (130 bpm). Noted Sx w/ PAT & NSVT during daylight hours, and only once at midnight.   ZIO PATCH MONITOR  01/2022   Predominant SR: Rate range 46-99 bpm, avg 61 bpm.  Rare PACs & PVCs (& rare couplets).  No bigeminy/trigeminy.  (Sx noted w/ PACs and PVCs - & a few Atrial Runs).  35 Atrial Runs + 1 "V. tach "run of 4 Narrow Complex beats; fastest atrial run 5 beats @ 193 bpm & longest 22 beats (12.1 sec) @ avg 108 bpm.    Immunization History  Administered Date(s)  Administered   DT (Pediatric) 07/06/2009   Tdap 12/26/2010, 06/25/2021    MEDICATIONS/ALLERGIES   Current Meds  Medication Sig   albuterol (VENTOLIN HFA) 108 (90 Base) MCG/ACT inhaler Inhale 1 puff  into the lungs as needed.   Ascorbic Acid (VITAMIN C) 1000 MG tablet Take 1,000 mg by mouth daily.   b complex vitamins tablet Take by mouth daily.   folic acid (FOLVITE) 1 MG tablet Take 1 mg by mouth. 2 tablets daily   hydroxychloroquine (PLAQUENIL) 200 MG tablet Take 400 mg by mouth 2 (two) times daily.    latanoprost (XALATAN) 0.005 % ophthalmic solution Place 1 drop into both eyes daily.    levothyroxine (SYNTHROID, LEVOTHROID) 150 MCG tablet Take 1 tablet by mouth daily.   magnesium oxide (MAG-OX) 400 MG tablet Take 1 tablet (400 mg total) by mouth 2 (two) times daily.   meclizine (ANTIVERT) 25 MG tablet Take 1 tablet by mouth as needed.   metoprolol tartrate (LOPRESSOR) 25 MG tablet TAKE 1 TABLET BY MOUTH THREE TIMES A DAY IF YOU ARE HAVING A PARTICULARY BAD AFTERNOON OR EVENING OF PAPLITATIONS TAKE AN ADDITIONAL 1/2 TABLET AND THEN ADJUST DOSE AT THE NEXT INTERVAL TO 1/2 TABLET AS OPPOSED TO 1 TABLET   mometasone (ELOCON) 0.1 % lotion Apply 1 application topically.   nystatin (MYCOSTATIN/NYSTOP) powder Apply 1 application topically 3 (three) times daily. Apply to affected area for up to 7 days   Vitamin D, Ergocalciferol, (DRISDOL) 1.25 MG (50000 UNIT) CAPS capsule Take 50,000 Units by mouth once a week.    Allergies  Allergen Reactions   Gluten Meal     Pt has celiac disease.    Hydrocodone Nausea And Vomiting   Pacerone [Amiodarone] Other (See Comments)    "does not tolerate"--slows heart rate   Propafenone Hcl     Caused very low HR   Verapamil     Fatigue, constipation    SOCIAL HISTORY/FAMILY HISTORY   Reviewed in Epic:  Pertinent findings:  Social History   Tobacco Use   Smoking status: Never   Smokeless tobacco: Never  Vaping Use   Vaping Use: Never used   Substance Use Topics   Alcohol use: No    Alcohol/week: 0.0 standard drinks   Drug use: No   Social History   Social History Narrative   She lives in [place], [place]   Right handed    Caffeine use: 1 cup coffee every morning    OBJCTIVE -PE, EKG, labs   Wt Readings from Last 3 Encounters:  04/13/22 262 lb 9.6 oz (119.1 kg)  01/26/22 259 lb (117.5 kg)  06/25/21 262 lb (118.8 kg)    Physical Exam: BP 128/68   Pulse (!) 55   Ht 5' 5"  (1.651 m)   Wt 262 lb 9.6 oz (119.1 kg)   LMP 05/05/2016 (Exact Date)   SpO2 97%   BMI 43.70 kg/m  Physical Exam Vitals reviewed.  Constitutional:      Appearance: Normal appearance.     Comments: Morbidly obese.  Well-groomed  HENT:     Head: Normocephalic and atraumatic.  Neck:     Vascular: No carotid bruit or JVD (Difficult to assess).  Cardiovascular:     Rate and Rhythm: Normal rate and regular rhythm. Occasional Extrasystoles are present.    Chest Wall: PMI is not displaced.     Pulses: Normal pulses.     Heart sounds: S1 normal and S2 normal. Heart sounds are distant. No murmur heard.   No friction rub. No gallop.  Pulmonary:     Effort: Pulmonary effort is normal. No respiratory distress.     Breath sounds: No wheezing, rhonchi or rales.  Chest:  Chest wall: No tenderness.  Musculoskeletal:        General: No swelling. Normal range of motion.     Cervical back: Normal range of motion and neck supple.  Skin:    General: Skin is warm and dry.  Neurological:     General: No focal deficit present.     Mental Status: She is alert and oriented to person, place, and time.     Gait: Gait abnormal.  Psychiatric:        Mood and Affect: Mood normal.        Behavior: Behavior normal.        Thought Content: Thought content normal.        Judgment: Judgment normal.     Adult ECG Report NA  Recent Labs:  reviewed  No results found for: CHOL, HDL, LDLCALC, LDLDIRECT, TRIG, CHOLHDL Lab Results  Component Value Date    CREATININE 1.02 (H) 01/26/2022   BUN 18 01/26/2022   NA 143 01/26/2022   K 4.6 01/26/2022   CL 102 01/26/2022   CO2 25 01/26/2022      Latest Ref Rng & Units 01/26/2022    4:13 PM 07/21/2021   12:57 AM 04/07/2017    3:16 PM  CBC  WBC 3.4 - 10.8 x10E3/uL 8.8   13.1   7.5    Hemoglobin 11.1 - 15.9 g/dL 14.2   13.4   13.0    Hematocrit 34.0 - 46.6 % 42.2   40.8   39.4    Platelets 150 - 450 x10E3/uL 347   353   367      No results found for: HGBA1C Lab Results  Component Value Date   TSH 2.260 01/26/2022    ==================================================  COVID-19 Education: The signs and symptoms of COVID-19 were discussed with the patient and how to seek care for testing (follow up with PCP or arrange E-visit).    I spent a total of 27 minutes with the patient spent in direct patient consultation.  Additional time spent with chart review  / charting (studies, outside notes, etc): 15 min Total Time: 42 min  Current medicines are reviewed at length with the patient today.  (+/- concerns) not sure if metoprolol is working anymore.  This visit occurred during the SARS-CoV-2 public health emergency.  Safety protocols were in place, including screening questions prior to the visit, additional usage of staff PPE, and extensive cleaning of exam room while observing appropriate contact time as indicated for disinfecting solutions.  Notice: This dictation was prepared with Dragon dictation along with smart phrase technology. Any transcriptional errors that result from this process are unintentional and may not be corrected upon review.  Studies Ordered:   No orders of the defined types were placed in this encounter.   Patient Instructions / Medication Changes & Studies & Tests Ordered   Patient Instructions  Medication Instructions:   You will be weaning off  decrease Metoprolol 25 mg one tablet twice a day for 7days then decrease to 12.5 mg ( 1/2 tablet of 25 mg ) twice a day  for 7 days then stop.  Once you start the 12.5 mg of Metoprolol twice a day , then start taking Verapamil 180 mg once daily.   Continue taking the verapamil after stopping Metoprolol   You may use 12.5 mg to 25 mg  of metoprolol if you have a break through in your fast heart rate.  *If you need a refill on your cardiac medications before  your next appointment, please call your pharmacy*   Lab Work: Not needed If you have labs (blood work) drawn today and your tests are completely normal, you will receive your results only by: [place] (if you have MyChart) OR A paper copy in the mail If you have any lab test that is abnormal or we need to change your treatment, we will call you to review the results.   Testing/Procedures:  Not needed  Follow-Up: At [facility], you and your health needs are our priority.  As part of our continuing mission to provide you with exceptional heart care, we have created designated Provider Care Teams.  These Care Teams include your primary Cardiologist (physician) and Advanced Practice Providers (APPs -  Physician Assistants and Nurse Practitioners) who all work together to provide you with the care you need, when you need it.     Your next appointment:   2 month(s)  The format for your next appointment:   In Person  Provider:   Glenetta Hew, MD        Glenetta Hew, M.D., M.S. Interventional Cardiologist   Pager # [phone] Phone # [phone] [address] [place], [place] [address]   Thank you for choosing Heartcare at [facility]!!

## 2022-04-22 ENCOUNTER — Encounter: Payer: Self-pay | Admitting: Cardiology

## 2022-04-22 NOTE — Telephone Encounter (Signed)
Quite possible that meds are having an effect --once you wean off metoprolol; see how you feel with the verapamil, however if you are still feeling poorly, Verapamil in half for 1 week and stop.  We can then see how your palpitations do ?  ?Glenetta Hew, MD ?

## 2022-04-22 NOTE — Telephone Encounter (Signed)
Quite possible that meds are having an effect --once you wean off metoprolol; see how you feel with the verapamil, however if you are still feeling poorly, Verapamil in half for 1 week and stop.  We can then see how your palpitations do ? ?Glenetta Hew, MD ? ?

## 2022-05-15 ENCOUNTER — Encounter: Payer: Self-pay | Admitting: Cardiology

## 2022-05-15 NOTE — Assessment & Plan Note (Signed)
SVT/PAT runs from a 5 to 10 seconds at the most.  Not really AVNRT type SVTs.  Not long enough to use vagal maneuvers. She was previously doing very well with TID dosing of beta-blocker, but has become fatigued and maybe had some tachyphylaxis with it.  Plan: Convert from metoprolol to verapamil  Decrease to 25 mg twice daily for 7 days and then 12.5 mg twice daily for 7 days, then stop. Once taking 12.5 mg twice daily-add verapamil 180 mg daily.  Going forward, okay to use 1/2-1 full tablet of metoprolol for breakthrough fast heart rates.  Continue to monitor and avoid triggers. Adequate hydration.

## 2022-05-15 NOTE — Assessment & Plan Note (Signed)
She was symptomatic for both PACs and PVCs.  Unfortunately metoprolol seems to be wearing off.  We will give a trial run of using verapamil as the underlying rate control agent with PRN beta-blocker.  See SVT section.

## 2022-05-15 NOTE — Assessment & Plan Note (Signed)
Current monitor suggested a V. tach run that was really a narrow complex run-May be fusion beats.

## 2022-06-22 ENCOUNTER — Ambulatory Visit (INDEPENDENT_AMBULATORY_CARE_PROVIDER_SITE_OTHER): Payer: Medicare Other | Admitting: Cardiology

## 2022-06-22 ENCOUNTER — Encounter: Payer: Self-pay | Admitting: Cardiology

## 2022-06-22 VITALS — BP 145/83 | HR 56 | Ht 65.0 in | Wt 260.0 lb

## 2022-06-22 DIAGNOSIS — I4729 Other ventricular tachycardia: Secondary | ICD-10-CM | POA: Diagnosis not present

## 2022-06-22 DIAGNOSIS — R42 Dizziness and giddiness: Secondary | ICD-10-CM | POA: Diagnosis not present

## 2022-06-22 DIAGNOSIS — I471 Supraventricular tachycardia: Secondary | ICD-10-CM | POA: Diagnosis not present

## 2022-06-22 MED ORDER — METOPROLOL TARTRATE 25 MG PO TABS: 12.5000 mg | ORAL_TABLET | Freq: Two times a day (BID) | ORAL | 3 refills | Status: DC

## 2022-06-22 NOTE — Patient Instructions (Signed)
Medication Instructions:    Continue taking Metoprolol tartrate 12.5 mg twice a day  - it is okay  to take an additional 12.5 - 25 mg tablet as needed for  breakthrough   *If you need a refill on your cardiac medications before your next appointment, please call your pharmacy*   Lab Work:  Not needed   Testing/Procedures:  Not needed  Follow-Up: At [facility], you and your health needs are our priority.  As part of our continuing mission to provide you with exceptional heart care, we have created designated Provider Care Teams.  These Care Teams include your primary Cardiologist (physician) and Advanced Practice Providers (APPs -  Physician Assistants and Nurse Practitioners) who all work together to provide you with the care you need, when you need it.     Your next appointment:   6 month(s)  The format for your next appointment:   In Person  Provider:   Glenetta Hew, MD

## 2022-06-22 NOTE — Progress Notes (Signed)
Primary Care Provider: Celene Squibb, MD Cardiologist: Glenetta Hew, MD Electrophysiologist: None  Clinic Note: Chief Complaint  Patient presents with   Follow-up    Did not take right now.  Did beta-blocker.   Palpitations    SVT history   ===================================  ASSESSMENT/PLAN   Problem List Items Addressed This Visit       Cardiology Problems   Nonsustained ventricular tachycardia ([place]) very short bursts; negative ischemic evaluation. (Chronic)    Probably not true VT. SVT with aberrancy      Relevant Medications   metoprolol tartrate (LOPRESSOR) 25 MG tablet   PSVT (paroxysmal supraventricular tachycardia) (HCC) - Primary (Chronic)    Short bursts of PAT and SVT noted on monitor, but she has had some prolonged spells. She never did convert from metoprolol over to verapamil.  She got part of the way down weaning off the beta-blocker but was concerned about coming off and switch to verapamil.  Clearly, her energy level improved with a dose of beta-blocker, and her symptoms of palpitations have gotten worse.  Plan: Go back to using metoprolol 25 tablets - 1/2 tablet twice daily but okay to use a third dose for palpitations. Adequate hydration, avoid triggers Discussed vagal maneuvers      Relevant Medications   metoprolol tartrate (LOPRESSOR) 25 MG tablet     Other   Episodic lightheadedness (Chronic)    Although not proven to be the case, it could very well be related to her bursts of PAT or SVT.      ===================================  HPI:    Maria Meza is a 66 y.o. female with a h/o Symptomatic PVCs, Short Bursts of PSVT/PAT (on low dose Beta Blocker) who presents today for 2 month f/u to reassess symptoms after changing medications.  She follows today  at the request of Celene Squibb, MD.  She had been followed by [name] ([facility]) up until 2018 but wanted to transfer care here to [facility].  Seen on April 05, 2021 -> she  is doing much better since switching the metoprolol dosing to 3 times a day from twice a day.  Her palpitations became less prominent and she had less bradycardia.  She still noted intermittent episodes but less frequent & shorter. Only 1 Notable episode lasting > 24 hr - felt HR going up & down intermittently .  => With tachycardia spells, she would feel dizzy and lightheaded but no syncope or near syncope.   Palpitations tend to occur when her thyroid levels are not well controlled.  Thankfully, with the TID dosing of beta-blocker, chronotropic incompetence has not been an issue.  [name] was recently seen by Laurann Montana, NP on January 26, 2022 with complaints of increased frequency of palpitations, chest tightness and dizziness along with diaphoresis and dyspnea..  BP ranges from 120/79-150/81 with heart rates ranging from the 50s to 80s. => She described the dizziness as feeling like she had water in her head and she was feeling room.  She did not take extra metoprolol.  No exertional dyspnea or chest pain. Thyroid panel CBC and magnesium levels check follow-up BMP. 14-day Zio patch monitor Continued TID Lopressor 25 mg with as needed additional dose. Felt that chest pain was not anginal in nature.  Therefore did not consider stress test.  Recent Hospitalizations: None  She was most recently seen on April 13, 2022-still noticing palpitations.  We did an Echocardiogram and Zio patch prior to this visit  that showed multiple atrial runs as well as a wide complex tachycardia run. ->  We decided to switch from metoprolol to verapamil.  Discussed vagal maneuvers.  Reviewed  CV studies:    The following studies were reviewed today: (if available, images/films reviewed: From Epic Chart or Care Everywhere) TTE 02/14/2022: Difficult images.  Normal EF 60 to 65%.  No RWMA.  Normal diastolic parameters.  Normal RV size and function.  Mild to moderate LA dilation.  Normal aortic and mitral valves.   Acing aorta measured roughly 38 mm. 14-day Zio Patch monitor worn for 12 days, 19 hours. February 2023 Predominant rhythm sinus rhythm: Minimum heart rate 46 bpm, maximum heart rate 99 bpm with an average of 61 bpm. Rare PACs and PVCs with rare couplets and only PAC triplets. No bigeminy or trigeminy.--These were what was noted on patient diary. 1 4 beat run of what looks like a narrow complex tachycardia read by the machine as ventricular tachycardia. Max rate 148 bpm. 35 atrial runs: Fastest interval was 9 beats at a rate of 193 bpm. Longest was 22 beats lasting 12.1 seconds at an average rate of 108 bpm.:  For the most part, symptoms are noted with PACs or PVCs in sinus rhythm, 3-4 atrial runs were also noted.  Interval History:   [name] returns for evaluation indicating that she never did start the verapamil after weaning off the beta-blocker. Since weaning off the beta-blocker, her energy level notably improved as has her exertional dyspnea. She said that she had 1 episode after coming off the beta-blocker where she felt numbness from head to toe felt flushing and nauseated but did not pass out.  Almost blacked out.  Otherwise she has not had any further episodes.  She brings with her BP log showing that her pressures have really been stable with a pressure range from 117/77 up to 149/93 with a majority of the blood pressures being in the 120s or greater systolic.  Heart rate range pretty much in the 60s per average but as low as 58 and as high as 93.  She also indicates that she had COVID antibody Roxy Manns related and [place] with significant symptoms.  CV Review of Symptoms (Summary) Cardiovascular ROS: positive for - irregular heartbeat, palpitations, rapid heart rate, and  -> really 1 episode of lightheadedness fatigue and numbness. negative for - chest pain, dyspnea on exertion, edema, orthopnea, paroxysmal nocturnal dyspnea, shortness of breath, or exertional chest pain; syncope  or near syncope, TIA/amaurosis fugax or claudication.  REVIEWED OF SYSTEMS   Review of Systems  Constitutional:  Positive for malaise/fatigue (Notably when she has palpitations.). Negative for weight loss.  HENT:  Negative for congestion and nosebleeds.   Respiratory:  Negative for cough.   Cardiovascular:        Per HPI  Gastrointestinal:  Negative for blood in stool and melena.  Genitourinary:  Negative for hematuria.  Neurological:  Positive for dizziness (Per HPI). Negative for loss of consciousness.  Psychiatric/Behavioral:  The patient is nervous/anxious (When she has these episodes.).    I have reviewed and (if needed) personally updated the patient's problem list, medications, allergies, past medical and surgical history, social and family history.   PAST MEDICAL HISTORY   Past Medical History:  Diagnosis Date   Absolute anemia    No longer on iron supplementation.   Adult celiac disease 2018   Anxiety    Arrhythmia 06/2016   Event monitor has shown PVCs, PSVT at 1  short burst of NSVT.;  Event monitor from March 2021: 6 short runs of wide-complex tachycardia and 25 runs of PSVT--intolerant of high-dose beta-blocker because of bradycardia.   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  (ANA positive, dsDNA negative)   Dysmenorrhea    Glaucoma    History of COVID-19 02/19/2020   IBS (irritable bowel syndrome)    Morbid obesity with BMI of 40.0-44.9, adult ([place]) 09/2020   BMI 43.07   MRSA infection 4-end-21   on abdomen   OSA on CPAP    Thyroid disease    hypothyroidism   TIA (transient ischemic attack)    2 per patient   Urinary incontinence    with sneezing, coughing    PAST SURGICAL HISTORY   Past Surgical History:  Procedure Laterality Date   CARDIAC EVENT MONITOR  06/2016   [facility]) Noted  PVCs, PSVT and NSVT (1 episode of 20 beats). ->  PT evaluation suggested PVCs appear to have been completely interpolated.  Felt to be septal   CARDIAC MRI -ADENOSINE / STRESS  05/28/2020   [facility]): EF 69%. Normal LV size, thickness and function w/ NO RWMA  CO & CI 6.5 L/min & 3 L/min.      Normal RV size thickness and function.  No RV WMA or aneurysm.  No findings c/w  ARVC.  Both atria are mildly enlarged.  Trileaflet aortic valve with no evidence of stenosis.  Adenosine Stress Imaging: no  inducible myocardial ischemia OR prior infarction/scar or infiltrate. Normal AoV. Mid TR.   CATARACT EXTRACTION Right 03/2018   CATARACT EXTRACTION Left 03/2018   CHOLECYSTECTOMY     LAPAROSCOPIC TUBAL LIGATION  1986   NUCLEAR STRESS TEST  05/2014   EF 55%.  Mixed anteroseptal defect consistent with breast attenuation artifact.   TRANSTHORACIC ECHOCARDIOGRAM  08/2016   [facility]- Normal LV size and function-EF~55%.  No RWMA..  Normal LA pressures.  Normal RV function.  Trivial AR, PR and TR.  No stenoses.    TRANSTHORACIC ECHOCARDIOGRAM  02/14/2022   Difficult images.  Normal EF 60 to 65%.  No RWMA.  Normal diastolic parameters.  Normal RV size and function.  Mild to moderate LA dilation.  Normal aortic and mitral valves.  Acing aorta measured roughly 38 mm.   ZIO PATCH CARDIAC EVENT MONITOR  02/2020   Predom Rhytym: SR - min 46 - max 113 bpm, avg 64 bpm. Rare isolated PVC & PACs. 6 short NSVT runs (8-11 beats, 134-245 bpm); 25 runs of  PAT/SVT - fastest 9 beats (200 bpm), longest 10.6 sec (130 bpm). Noted Sx w/ PAT & NSVT during daylight hours, and only once at midnight.   ZIO PATCH MONITOR  01/2022   Predominant SR: Rate range 46-99 bpm, avg 61 bpm.  Rare PACs & PVCs (& rare couplets).  No bigeminy/trigeminy.  (Sx noted w/ PACs and PVCs - & a few Atrial Runs).  35 Atrial Runs + 1 "V. tach "run of 4 Narrow Complex beats; fastest atrial run 5 beats @ 193 bpm & longest 22 beats (12.1 sec) @ avg 108 bpm.     Immunization History  Administered Date(s) Administered   DT (Pediatric) 07/06/2009   Tdap 12/26/2010, 06/25/2021    MEDICATIONS/ALLERGIES   Current Meds  Medication Sig   Ascorbic Acid (VITAMIN C) 1000 MG tablet Take 1,000  mg by mouth daily.   b complex vitamins tablet Take by mouth daily.   folic acid (FOLVITE) 1 MG tablet Take 1 mg by mouth. 2 tablets daily   latanoprost (XALATAN) 0.005 % ophthalmic solution Place 1 drop into both eyes daily.    levothyroxine (SYNTHROID, LEVOTHROID) 150 MCG tablet Take 1 tablet by mouth daily.   magnesium oxide (MAG-OX) 400 MG tablet Take 1 tablet (400 mg total) by mouth 2 (two) times daily.   mometasone (ELOCON) 0.1 % lotion Apply 1 application topically.   Vitamin D, Ergocalciferol, (DRISDOL) 1.25 MG (50000 UNIT) CAPS capsule Take 50,000 Units by mouth once a week.   []  metoprolol tartrate (LOPRESSOR) 25 MG tablet IF YOU ARE HAVING A PARTICULARY BAD AFTERNOON OR EVENING OF PAPLITATIONS TAKE AN ADDITIONAL 12.5 MG TO 25 MG TABLET    Allergies  Allergen Reactions   Gluten Meal     Pt has celiac disease.    Hydrocodone Nausea And Vomiting   Pacerone [Amiodarone] Other (See Comments)    "does not tolerate"--slows heart rate   Propafenone Hcl     Caused very low HR   Verapamil     Fatigue, constipation    SOCIAL HISTORY/FAMILY HISTORY   Reviewed in Epic:  Pertinent findings:  Social History   Tobacco Use   Smoking status: Never   Smokeless tobacco: Never  Vaping Use   Vaping Use: Never used  Substance Use Topics   Alcohol use: No    Alcohol/week: 0.0 standard drinks of alcohol   Drug use: No   Social History   Social History Narrative   She lives in [place], [place]   Right handed    Caffeine use: 1 cup coffee every morning    OBJCTIVE -PE, EKG, labs   Wt Readings from Last 3 Encounters:  06/22/22 260 lb (117.9 kg)  04/13/22 262 lb 9.6 oz (119.1 kg)  01/26/22 259 lb (117.5 kg)    Physical Exam: BP (!) 145/83    Pulse (!) 56   Ht 5' 5"  (1.651 m)   Wt 260 lb (117.9 kg)   LMP 04/16/2016 (Exact Date)   SpO2 95%   BMI 43.27 kg/m  Physical Exam Vitals reviewed.  Constitutional:      Appearance: Normal appearance.     Comments: Morbidly obese.  Well-groomed  HENT:     Head: Normocephalic and atraumatic.  Neck:     Vascular: No carotid bruit or JVD (Difficult to assess).  Cardiovascular:     Rate and Rhythm: Normal rate and regular rhythm.  Pulmonary:     Effort: Pulmonary effort is normal. No respiratory distress.  Musculoskeletal:        General: No swelling. Normal range of motion.     Cervical back: Normal range of motion and neck supple.  Skin:    General: Skin is warm and dry.  Neurological:     General: No focal deficit present.     Mental Status: She is alert and oriented to person, place, and time.     Gait: Gait abnormal.  Psychiatric:        Mood and Affect: Mood normal.        Behavior: Behavior normal.        Thought Content: Thought content normal.        Judgment: Judgment normal.     Adult ECG Report NA  Recent Labs:  reviewed  No results found for: "CHOL", "HDL", "LDLCALC", "LDLDIRECT", "TRIG", "CHOLHDL" Lab Results  Component Value  Date   CREATININE 1.02 (H) 01/26/2022   BUN 18 01/26/2022   NA 143 01/26/2022   K 4.6 01/26/2022   CL 102 01/26/2022   CO2 25 01/26/2022      Latest Ref Rng & Units 01/26/2022    4:13 PM 07/21/2021   12:57 AM 04/07/2017    3:16 PM  CBC  WBC 3.4 - 10.8 x10E3/uL 8.8  13.1  7.5   Hemoglobin 11.1 - 15.9 g/dL 14.2  13.4  13.0   Hematocrit 34.0 - 46.6 % 42.2  40.8  39.4   Platelets 150 - 450 x10E3/uL 347  353  367     No results found for: "HGBA1C" Lab Results  Component Value Date   TSH 2.260 01/26/2022    ==================================================  I spent a total of 14 minutes with the patient spent in direct patient consultation.  Additional time spent with chart review  / charting (studies, outside notes, etc): 13  min Total Time: 27 min  Current medicines are reviewed at length with the patient today.  (+/- concerns) not sure if metoprolol is working anymore.  Notice: This dictation was prepared with Dragon dictation along with smart phrase technology. Any transcriptional errors that result from this process are unintentional and may not be corrected upon review.  Studies Ordered:   No orders of the defined types were placed in this encounter.   Patient Instructions / Medication Changes & Studies & Tests Ordered   Patient Instructions  Medication Instructions:    Continue taking Metoprolol tartrate 12.5 mg twice a day  - it is okay  to take an additional 12.5 - 25 mg tablet as needed for  breakthrough   *If you need a refill on your cardiac medications before your next appointment, please call your pharmacy*   Lab Work:  Not needed   Testing/Procedures:  Not needed  Follow-Up: At [facility], you and your health needs are our priority.  As part of our continuing mission to provide you with exceptional heart care, we have created designated Provider Care Teams.  These Care Teams include your primary Cardiologist (physician) and Advanced Practice Providers (APPs -  Physician Assistants and Nurse Practitioners) who all work together to provide you with the care you need, when you need it.     Your next appointment:   6 month(s)  The format for your next appointment:   In Person  Provider:   Glenetta Hew, MD       Glenetta Hew, M.D., M.S. Interventional Cardiologist   Pager # [phone] Phone # [phone] [address]. [place], [place] [address]   Thank you for choosing Heartcare at [facility]!!

## 2022-06-27 ENCOUNTER — Ambulatory Visit: Payer: Medicare Other | Admitting: Obstetrics and Gynecology

## 2022-07-16 ENCOUNTER — Encounter: Payer: Self-pay | Admitting: Cardiology

## 2022-07-16 NOTE — Assessment & Plan Note (Addendum)
Short bursts of PAT and SVT noted on monitor, but she has had some prolonged spells. She never did convert from metoprolol over to verapamil.  She got part of the way down weaning off the beta-blocker but was concerned about coming off and switch to verapamil.  Clearly, her energy level improved with a dose of beta-blocker, and her symptoms of palpitations have gotten worse.  Plan:  Go back to using metoprolol 25 tablets - 1/2 tablet twice daily but okay to use a third dose for palpitations.  Adequate hydration, avoid triggers  Discussed vagal maneuvers

## 2022-07-16 NOTE — Assessment & Plan Note (Signed)
Although not proven to be the case, it could very well be related to her bursts of PAT or SVT.

## 2022-07-16 NOTE — Assessment & Plan Note (Signed)
Probably not true VT. SVT with aberrancy

## 2022-08-17 ENCOUNTER — Other Ambulatory Visit: Payer: Self-pay | Admitting: Obstetrics and Gynecology

## 2022-08-17 DIAGNOSIS — Z1231 Encounter for screening mammogram for malignant neoplasm of breast: Secondary | ICD-10-CM

## 2022-09-08 ENCOUNTER — Telehealth: Payer: Self-pay | Admitting: Cardiology

## 2022-09-08 NOTE — Telephone Encounter (Signed)
Pt c/o medication issue:  1. Name of Medication:   levothyroxine (SYNTHROID, LEVOTHROID) 150 MCG tablet  2. How are you currently taking this medication (dosage and times per day)? As prescribed  3. Are you having a reaction (difficulty breathing--STAT)?   No  4. What is your medication issue?   Patient called stating she went to the ED last night because her heart rate had shot up to 175.  Patient said they did an EKG and it was down to 165 at that time.  Patient stated she believes this is due to her having reduced this medication.  Patient stated BP now is 125/83 and  HR 85 and no other symptoms.  Patient would like advice on next steps.

## 2022-09-08 NOTE — Telephone Encounter (Signed)
I spoke with patient. She went to [facility] ED and had EKG done, HR was 101. She was then told to wait in ED. After 4 hours waiting, she went home.   She recently had levothyroxine decreased to 125 mcg and felt this may have played a factor.   She has not taken any extra Lopressor. States she feels tired.     Please advise.

## 2022-09-08 NOTE — Telephone Encounter (Signed)
Would not know what to make of an EKG rate of 101 unless it was an arrhythmia.  I would not do fast enough for her to PSVT.  Recommend patient in the situation probably be to increase p.o. hydration.  If she is feeling tired I would not recommend taking an additional dose of metoprolol that she is feeling the palpitations and fast heart rate.  She had some short bursts of SVT which would be very fast not 101 beats a minute.  Anymore I can 1 50-200 beats a minute range.  This is very different than what was probably sinus tachycardia 101 bpm.  If it is atrial fibrillation or atrial flutter then we may consider EKG to determine if any other options needed.  Glenetta Hew, MD

## 2022-09-08 NOTE — Telephone Encounter (Signed)
I discussed Dr.Harding's message with Ms.Mowrer. She has monitored HR and BP all day , HR in the 70's. She took a small nap and feels a little better.She will continue to hydrate. She said she may explore the option to see EP to discuss cardioversion if this continues.

## 2022-09-09 ENCOUNTER — Telehealth: Payer: Self-pay | Admitting: Cardiology

## 2022-09-09 ENCOUNTER — Encounter (HOSPITAL_COMMUNITY): Payer: Self-pay | Admitting: *Deleted

## 2022-09-09 ENCOUNTER — Emergency Department (HOSPITAL_COMMUNITY): Payer: Medicare Other

## 2022-09-09 ENCOUNTER — Other Ambulatory Visit: Payer: Self-pay

## 2022-09-09 ENCOUNTER — Inpatient Hospital Stay (HOSPITAL_COMMUNITY)
Admission: EM | Admit: 2022-09-09 | Discharge: 2022-09-15 | DRG: 309 | Disposition: A | Discharge status: Home / Self Care | Payer: Medicare Other | Attending: Internal Medicine | Admitting: Internal Medicine

## 2022-09-09 DIAGNOSIS — Z9989 Dependence on other enabling machines and devices: Secondary | ICD-10-CM

## 2022-09-09 DIAGNOSIS — Z8679 Personal history of other diseases of the circulatory system: Secondary | ICD-10-CM

## 2022-09-09 DIAGNOSIS — Z8349 Family history of other endocrine, nutritional and metabolic diseases: Secondary | ICD-10-CM

## 2022-09-09 DIAGNOSIS — G4733 Obstructive sleep apnea (adult) (pediatric): Secondary | ICD-10-CM

## 2022-09-09 DIAGNOSIS — I471 Supraventricular tachycardia, unspecified: Secondary | ICD-10-CM | POA: Diagnosis present

## 2022-09-09 DIAGNOSIS — Z79899 Other long term (current) drug therapy: Secondary | ICD-10-CM

## 2022-09-09 DIAGNOSIS — Z833 Family history of diabetes mellitus: Secondary | ICD-10-CM

## 2022-09-09 DIAGNOSIS — L259 Unspecified contact dermatitis, unspecified cause: Secondary | ICD-10-CM | POA: Diagnosis present

## 2022-09-09 DIAGNOSIS — Z6841 Body Mass Index (BMI) 40.0 and over, adult: Secondary | ICD-10-CM

## 2022-09-09 DIAGNOSIS — Z9842 Cataract extraction status, left eye: Secondary | ICD-10-CM

## 2022-09-09 DIAGNOSIS — Z885 Allergy status to narcotic agent status: Secondary | ICD-10-CM

## 2022-09-09 DIAGNOSIS — Z79890 Hormone replacement therapy (postmenopausal): Secondary | ICD-10-CM

## 2022-09-09 DIAGNOSIS — Z9841 Cataract extraction status, right eye: Secondary | ICD-10-CM

## 2022-09-09 DIAGNOSIS — Z8261 Family history of arthritis: Secondary | ICD-10-CM

## 2022-09-09 DIAGNOSIS — K900 Celiac disease: Secondary | ICD-10-CM | POA: Diagnosis present

## 2022-09-09 DIAGNOSIS — Z7901 Long term (current) use of anticoagulants: Secondary | ICD-10-CM

## 2022-09-09 DIAGNOSIS — F419 Anxiety disorder, unspecified: Secondary | ICD-10-CM | POA: Diagnosis present

## 2022-09-09 DIAGNOSIS — Z820 Family history of epilepsy and other diseases of the nervous system: Secondary | ICD-10-CM

## 2022-09-09 DIAGNOSIS — I4891 Unspecified atrial fibrillation: Principal | ICD-10-CM

## 2022-09-09 DIAGNOSIS — Z8616 Personal history of COVID-19: Secondary | ICD-10-CM

## 2022-09-09 DIAGNOSIS — Z8673 Personal history of transient ischemic attack (TIA), and cerebral infarction without residual deficits: Secondary | ICD-10-CM

## 2022-09-09 DIAGNOSIS — I4510 Unspecified right bundle-branch block: Secondary | ICD-10-CM | POA: Diagnosis present

## 2022-09-09 DIAGNOSIS — Z8249 Family history of ischemic heart disease and other diseases of the circulatory system: Secondary | ICD-10-CM

## 2022-09-09 DIAGNOSIS — Z823 Family history of stroke: Secondary | ICD-10-CM

## 2022-09-09 DIAGNOSIS — I4819 Other persistent atrial fibrillation: Secondary | ICD-10-CM | POA: Diagnosis present

## 2022-09-09 DIAGNOSIS — Z91040 Latex allergy status: Secondary | ICD-10-CM

## 2022-09-09 DIAGNOSIS — E66813 Obesity, class 3: Secondary | ICD-10-CM

## 2022-09-09 DIAGNOSIS — H409 Unspecified glaucoma: Secondary | ICD-10-CM | POA: Diagnosis present

## 2022-09-09 DIAGNOSIS — Z888 Allergy status to other drugs, medicaments and biological substances status: Secondary | ICD-10-CM

## 2022-09-09 DIAGNOSIS — E039 Hypothyroidism, unspecified: Secondary | ICD-10-CM | POA: Diagnosis present

## 2022-09-09 DIAGNOSIS — Z83438 Family history of other disorder of lipoprotein metabolism and other lipidemia: Secondary | ICD-10-CM

## 2022-09-09 LAB — CBC WITH DIFFERENTIAL/PLATELET
Abs Immature Granulocytes: 0.01 10*3/uL (ref 0.00–0.07)
Basophils Absolute: 0.1 10*3/uL (ref 0.0–0.1)
Basophils Relative: 1 %
Eosinophils Absolute: 0.2 10*3/uL (ref 0.0–0.5)
Eosinophils Relative: 3 %
HCT: 45.9 % (ref 36.0–46.0)
Hemoglobin: 15.4 g/dL — ABNORMAL HIGH (ref 12.0–15.0)
Immature Granulocytes: 0 %
Lymphocytes Relative: 29 %
Lymphs Abs: 2.7 10*3/uL (ref 0.7–4.0)
MCH: 31.1 pg (ref 26.0–34.0)
MCHC: 33.6 g/dL (ref 30.0–36.0)
MCV: 92.7 fL (ref 80.0–100.0)
Monocytes Absolute: 0.6 10*3/uL (ref 0.1–1.0)
Monocytes Relative: 7 %
Neutro Abs: 5.7 10*3/uL (ref 1.7–7.7)
Neutrophils Relative %: 60 %
Platelets: 379 10*3/uL (ref 150–400)
RBC: 4.95 MIL/uL (ref 3.87–5.11)
RDW: 13.2 % (ref 11.5–15.5)
WBC: 9.4 10*3/uL (ref 4.0–10.5)
nRBC: 0 % (ref 0.0–0.2)

## 2022-09-09 LAB — MAGNESIUM: Magnesium: 2.2 mg/dL (ref 1.7–2.4)

## 2022-09-09 LAB — COMPREHENSIVE METABOLIC PANEL
ALT: 32 U/L (ref 0–44)
AST: 25 U/L (ref 15–41)
Albumin: 4.6 g/dL (ref 3.5–5.0)
Alkaline Phosphatase: 76 U/L (ref 38–126)
Anion gap: 7 (ref 5–15)
BUN: 20 mg/dL (ref 8–23)
CO2: 27 mmol/L (ref 22–32)
Calcium: 10 mg/dL (ref 8.9–10.3)
Chloride: 105 mmol/L (ref 98–111)
Creatinine, Ser: 1 mg/dL (ref 0.44–1.00)
GFR, Estimated: >60 mL/min (ref >60)
Glucose, Bld: 98 mg/dL (ref 70–99)
Potassium: 4.3 mmol/L (ref 3.5–5.1)
Sodium: 139 mmol/L (ref 135–145)
Total Bilirubin: 0.8 mg/dL (ref 0.3–1.2)
Total Protein: 7.8 g/dL (ref 6.5–8.1)

## 2022-09-09 LAB — TSH: TSH: 1.175 u[IU]/mL (ref 0.350–4.500)

## 2022-09-09 LAB — TROPONIN I (HIGH SENSITIVITY)
Troponin I (High Sensitivity): 10 ng/L (ref <18)
Troponin I (High Sensitivity): 10 ng/L (ref <18)

## 2022-09-09 LAB — T4, FREE: Free T4: 0.86 ng/dL (ref 0.61–1.12)

## 2022-09-09 MED ORDER — DILTIAZEM LOAD VIA INFUSION
10.0000 mg | Freq: Once | INTRAVENOUS | Status: AC
  Administered 2022-09-09: 10 mg via INTRAVENOUS
  Filled 2022-09-09: qty 10

## 2022-09-09 MED ORDER — METOPROLOL TARTRATE 50 MG PO TABS
50.0000 mg | ORAL_TABLET | Freq: Once | ORAL | Status: AC
  Administered 2022-09-09: 50 mg via ORAL
  Filled 2022-09-09: qty 1

## 2022-09-09 MED ORDER — APIXABAN 5 MG PO TABS
5.0000 mg | ORAL_TABLET | Freq: Two times a day (BID) | ORAL | Status: DC
  Administered 2022-09-09 – 2022-09-15 (×12): 5 mg via ORAL
  Filled 2022-09-09 (×12): qty 1

## 2022-09-09 MED ORDER — MAGNESIUM OXIDE 400 MG PO TABS: 400.0000 mg | ORAL_TABLET | Freq: Two times a day (BID) | ORAL | Status: DC

## 2022-09-09 MED ORDER — DILTIAZEM HCL-DEXTROSE 125-5 MG/125ML-% IV SOLN (PREMIX)
2.5000 mg/h | INTRAVENOUS | Status: AC
  Administered 2022-09-09: 5 mg/h via INTRAVENOUS
  Administered 2022-09-10: 10 mg/h via INTRAVENOUS
  Filled 2022-09-09 (×2): qty 125

## 2022-09-09 MED ORDER — FOLIC ACID 1 MG PO TABS
2.0000 mg | ORAL_TABLET | Freq: Every day | ORAL | Status: DC
  Administered 2022-09-10 – 2022-09-15 (×6): 2 mg via ORAL
  Filled 2022-09-09 (×6): qty 2

## 2022-09-09 MED ORDER — MAGNESIUM OXIDE -MG SUPPLEMENT 400 (240 MG) MG PO TABS
400.0000 mg | ORAL_TABLET | Freq: Two times a day (BID) | ORAL | Status: DC
  Administered 2022-09-09 – 2022-09-15 (×12): 400 mg via ORAL
  Filled 2022-09-09 (×12): qty 1

## 2022-09-09 MED ORDER — VITAMIN D (ERGOCALCIFEROL) 1.25 MG (50000 UNIT) PO CAPS
50000.0000 [IU] | ORAL_CAPSULE | ORAL | Status: DC
  Administered 2022-09-14: 50000 [IU] via ORAL
  Filled 2022-09-09 (×2): qty 1

## 2022-09-09 MED ORDER — METOPROLOL TARTRATE 5 MG/5ML IV SOLN
5.0000 mg | INTRAVENOUS | Status: DC | PRN
  Administered 2022-09-09 (×2): 5 mg via INTRAVENOUS
  Filled 2022-09-09 (×3): qty 5

## 2022-09-09 MED ORDER — B COMPLEX-C PO TABS
1.0000 | ORAL_TABLET | Freq: Every day | ORAL | Status: DC
  Administered 2022-09-10 – 2022-09-15 (×6): 1 via ORAL
  Filled 2022-09-09 (×8): qty 1

## 2022-09-09 MED ORDER — LATANOPROST 0.005 % OP SOLN
1.0000 [drp] | Freq: Every day | OPHTHALMIC | Status: DC
  Administered 2022-09-09 – 2022-09-14 (×6): 1 [drp] via OPHTHALMIC
  Filled 2022-09-09: qty 2.5

## 2022-09-09 MED ORDER — B COMPLEX PO TABS: 1.0000 | ORAL_TABLET | Freq: Every day | ORAL | Status: DC

## 2022-09-09 MED ORDER — METOPROLOL TARTRATE 50 MG PO TABS
50.0000 mg | ORAL_TABLET | Freq: Two times a day (BID) | ORAL | Status: DC
  Administered 2022-09-10 – 2022-09-11 (×4): 50 mg via ORAL
  Filled 2022-09-09 (×4): qty 1

## 2022-09-09 NOTE — ED Triage Notes (Addendum)
C/o CP, onset Wednesday, c/o palpitations and racing, comes and goes, associated with sweatiness, dizziness, sob. Denies nausea, syncope, vomiting, fever. Went to ED in [facility] Wednesday. No meds PTA. Alert, NAD, calm. Reports: Some diarrhea this morning/ mild. Adjusting thyroid med. H/o arrhythmias, denies h/o afib, pt of cardiology [name]. H/o zio patch. Denies blood thinner.

## 2022-09-09 NOTE — H&P (Signed)
TRH H&P   Patient Demographics:    Maria Meza, is a 66 y.o. female  MRN: [id number]   DOB - 1956-08-17  Admit Date - 09/09/2022  Outpatient Primary MD for the patient is Celene Squibb, MD  Referring MD/NP/PA: Dr. Tomi Bamberger  Outpatient Specialists: Cardiology [name]  Patient coming from: Home  Chief Complaint  Patient presents with   Palpitations      HPI:    Maria Meza  is a 66 y.o. female, with past medical history of hypothyroidism (dose recently adjusted), unspecified connective tissue disorder (she is not taking Plaquenil anymore), history of palpitation, SVTs, obesity, sleep apnea, she presents to ED secondary to complaints of palpitation, patient follows with her cardiologist [name] regarding that, where she had Zio patch, she is known to have history of SVT for which she is taking dose metoprolol, patient with hypothyroidism her Synthroid dose has been adjusted recently, she denies chest pain, but some times she reports lightheadedness and mild dyspnea. -In ED she was noted to be in A-fib with RVR, heart rate occasionally going up to 140s, she received IV metoprolol, p.o. metoprolol, despite that heart rate remains elevated, so [facility] hospitalist consulted to admit.    Review of systems:    A full 10 point Review of Systems was done, except as stated above, all other Review of Systems were negative.   With Past History of the following :    Past Medical History:  Diagnosis Date   Absolute anemia    No longer on iron supplementation.   Adult celiac disease 2018   Anxiety    Arrhythmia 06/2016   Event monitor has shown PVCs, PSVT at 1 short burst of NSVT.;  Event monitor from March 2021: 6 short runs of wide-complex tachycardia and 25 runs of PSVT--intolerant of high-dose beta-blocker because of bradycardia.   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  (ANA positive, dsDNA negative)   Dysmenorrhea    Glaucoma    History of COVID-19 02/19/2020   IBS (irritable bowel syndrome)    Morbid obesity with BMI of 40.0-44.9, adult ([place]) 09/2020   BMI 43.07   MRSA infection 4-end-21   on abdomen   OSA on CPAP    Thyroid disease    hypothyroidism   TIA (transient ischemic attack)    2 per patient   Urinary incontinence    with sneezing, coughing      Past Surgical History:  Procedure Laterality Date   CARDIAC EVENT MONITOR  06/2016   [facility]) Noted PVCs, PSVT and NSVT (1 episode of 20 beats). ->  PT evaluation suggested PVCs appear to have been completely interpolated.  Felt to be septal   CARDIAC MRI -ADENOSINE / STRESS  05/28/2020   [facility]): EF 69%. Normal LV size, thickness and function w/ NO RWMA  CO & CI 6.5 L/min & 3 L/min.      Normal RV size thickness and function.  No RV WMA or aneurysm.  No findings c/w  ARVC.  Both atria are mildly enlarged.  Trileaflet aortic valve with no evidence of stenosis.  Adenosine Stress Imaging: no  inducible myocardial ischemia OR prior infarction/scar or infiltrate. Normal AoV. Mid TR.   CATARACT EXTRACTION Right 03/2018   CATARACT EXTRACTION Left 03/2018   CHOLECYSTECTOMY     LAPAROSCOPIC TUBAL LIGATION  1986   NUCLEAR STRESS TEST  05/2014   EF 55%.  Mixed anteroseptal defect consistent with breast attenuation artifact.   TRANSTHORACIC ECHOCARDIOGRAM  08/2016   [facility]- Normal LV size and function-EF~55%.  No RWMA..  Normal LA pressures.  Normal RV function.  Trivial AR, PR and TR.  No stenoses.    TRANSTHORACIC ECHOCARDIOGRAM  02/14/2022   Difficult images.  Normal EF 60 to 65%.  No RWMA.  Normal diastolic parameters.  Normal RV size and function.  Mild to moderate LA dilation.  Normal aortic and mitral valves.  Acing aorta measured  roughly 38 mm.   ZIO PATCH CARDIAC EVENT MONITOR  02/2020   [facility] Rhytym: SR - min 46 - max 113 bpm, avg 64 bpm. Rare isolated PVC & PACs. 6 short NSVT runs (8-11 beats, 134-245 bpm); 25 runs of  PAT/SVT - fastest 9 beats (200 bpm), longest 10.6 sec (130 bpm). Noted Sx w/ PAT & NSVT during daylight hours, and only once at midnight.   ZIO PATCH MONITOR  01/2022   Predominant SR: Rate range 46-99 bpm, avg 61 bpm.  Rare PACs & PVCs (& rare couplets).  No bigeminy/trigeminy.  (Sx noted w/ PACs and PVCs - & a few Atrial Runs).  35 Atrial Runs + 1 "V. tach "run of 4 Narrow Complex beats; fastest atrial run 5 beats @ 193 bpm & longest 22 beats (12.1 sec) @ avg 108 bpm.      Social History:     Social History   Tobacco Use   Smoking status: Never   Smokeless tobacco: Never  Substance Use Topics   Alcohol use: No    Alcohol/week: 0.0 standard drinks of alcohol       Family History :     Family History  Problem Relation Age of Onset   Heart attack Father        dec age 55   Hypertension Father    Hypertension Mother    Hyperlipidemia Mother    Migraines Mother    Seizures Mother        epilepsy   Stroke Mother        TIA's   Thyroid disease Sister        hypothyroid   Thyroid disease Sister        hypothyroid   Rheum arthritis Maternal Grandmother    Migraines Maternal Grandmother    Cancer Maternal Grandfather 52       colon ca--DEC age 13   Diabetes Maternal Grandfather    Stroke Paternal Grandmother        multiple   Thyroid disease Paternal Grandmother        goiter-hypothyroid   Breast cancer Maternal Aunt       Home Medications:   Prior to Admission medications   Medication Sig Start Date End Date Taking? Authorizing Provider  albuterol (VENTOLIN HFA) 108 (90 Base)  MCG/ACT inhaler Inhale 1 puff into the lungs as needed. Patient not taking: Reported on 06/22/2022 04/07/14   [provider]  Ascorbic Acid (VITAMIN C) 1000 MG tablet Take 1,000 mg by mouth  daily.    [provider]  b complex vitamins tablet Take by mouth daily.    [provider]  folic acid (FOLVITE) 1 MG tablet Take 1 mg by mouth. 2 tablets daily 04/21/11   [provider]  hydroxychloroquine (PLAQUENIL) 200 MG tablet Take 400 mg by mouth 2 (two) times daily.  Patient not taking: Reported on 06/22/2022 06/21/14   [provider]  latanoprost (XALATAN) 0.005 % ophthalmic solution Place 1 drop into both eyes daily.     [provider]  levothyroxine (SYNTHROID, LEVOTHROID) 150 MCG tablet Take 1 tablet by mouth daily. 04/01/18   [provider]  magnesium oxide (MAG-OX) 400 MG tablet Take 1 tablet (400 mg total) by mouth 2 (two) times daily. 09/06/21   Leonie Man, MD  meclizine (ANTIVERT) 25 MG tablet Take 1 tablet by mouth as needed. Patient not taking: Reported on 06/22/2022 01/24/18   [provider]  metoprolol tartrate (LOPRESSOR) 25 MG tablet Take 0.5 tablets (12.5 mg total) by mouth 2 (two) times daily. IF YOU ARE HAVING A PARTICULARY BAD AFTERNOON OR EVENING OF PAPLITATIONS TAKE AN ADDITIONAL 12.5 MG TO 25 MG TABLET 06/22/22   Leonie Man, MD  mometasone (ELOCON) 0.1 % lotion Apply 1 application topically.    [provider]  Vitamin D, Ergocalciferol, (DRISDOL) 1.25 MG (50000 UNIT) CAPS capsule Take 50,000 Units by mouth once a week. 04/17/21   [provider]     Allergies:     Allergies  Allergen Reactions   Latex Other (See Comments)    blisters   Gluten Meal     Pt has celiac disease.    Hydrocodone Nausea And Vomiting   Pacerone [Amiodarone] Other (See Comments)    "does not tolerate"--slows heart rate   Propafenone Hcl     Caused very low HR   Verapamil     Fatigue, constipation     Physical Exam:   Vitals  Blood pressure 137/80, pulse (!) 129, temperature 98.1 F (36.7 C), temperature source Oral, resp. rate 15, height 5' 5"  (1.651 m), weight 111.1 kg, last menstrual  period 04/16/2016, SpO2 98 %.   1. General well-developed female, with obesity, laying in bed in no apparent distress  2. Normal affect and insight, Not Suicidal or Homicidal, Awake Alert, Oriented X 3.  3. No F.N deficits, ALL C.Nerves Intact, Strength 5/5 all 4 extremities, Sensation intact all 4 extremities, Plantars down going.  4. Ears and Eyes appear Normal, Conjunctivae clear, PERRLA. Moist Oral Mucosa.  5. Supple Neck, No JVD, No cervical lymphadenopathy appriciated, No Carotid Bruits.  6. Symmetrical Chest wall movement, Good air movement bilaterally, CTAB.  7.  Irregular irregular, tachycardic, No Gallops, Rubs or Murmurs, No Parasternal Heave.  8. Positive Bowel Sounds, Abdomen Soft, No tenderness, No organomegaly appriciated,No rebound -guarding or rigidity.  9.  No Cyanosis, Normal Skin Turgor, No Skin Rash or Bruise.  10. Good muscle tone,  joints appear normal , no effusions, Normal ROM.     Data Review:    CBC Recent Labs  Lab 09/09/22 1547  WBC 9.4  HGB 15.4*  HCT 45.9  PLT 379  MCV 92.7  MCH 31.1  MCHC 33.6  RDW 13.2  LYMPHSABS 2.7  MONOABS 0.6  EOSABS  0.2  BASOSABS 0.1   ------------------------------------------------------------------------------------------------------------------  Chemistries  Recent Labs  Lab 09/09/22 1547  NA 139  K 4.3  CL 105  CO2 27  GLUCOSE 98  BUN 20  CREATININE 1.00  CALCIUM 10.0  MG 2.2  AST 25  ALT 32  ALKPHOS 76  BILITOT 0.8   ------------------------------------------------------------------------------------------------------------------ estimated creatinine clearance is 67.7 mL/min (by C-G formula based on SCr of 1 mg/dL). ------------------------------------------------------------------------------------------------------------------ Recent Labs    09/09/22 1547  TSH 1.175    Coagulation profile No results for input(s): "INR", "PROTIME" in the last 168  hours. ------------------------------------------------------------------------------------------------------------------- No results for input(s): "DDIMER" in the last 72 hours. -------------------------------------------------------------------------------------------------------------------  Cardiac Enzymes No results for input(s): "CKMB", "TROPONINI", "MYOGLOBIN" in the last 168 hours.  Invalid input(s): "CK" ------------------------------------------------------------------------------------------------------------------ No results found for: "BNP"   ---------------------------------------------------------------------------------------------------------------  Urinalysis    Component Value Date/Time   BILIRUBINUR n 04/06/2016 1304   PROTEINUR n 04/06/2016 1304   UROBILINOGEN negative 04/06/2016 1304   NITRITE n 04/06/2016 1304   LEUKOCYTESUR Negative 04/06/2016 1304    ----------------------------------------------------------------------------------------------------------------   Imaging Results:    DG Chest 2 View  Result Date: 09/09/2022 CLINICAL DATA:  Chest pain EXAM: CHEST - 2 VIEW COMPARISON:  None Available. FINDINGS: The heart size and mediastinal contours are within normal limits. Both lungs are clear. The visualized skeletal structures are unremarkable. Surgical clips in the right upper quadrant IMPRESSION: No active cardiopulmonary disease. Electronically Signed   By: Marin Roberts M.D.   On: 09/09/2022 16:05    My personal review of EKG: Rhythm A-fib with RVR at 125 bpm, QTc of 461, right bundle branch block rate   Principal Problem:   Atrial fibrillation with RVR (HCC) Active Problems:   OSA on CPAP   PSVT (paroxysmal supraventricular tachycardia) (HCC)    A-fib with RVR -Appears to be new onset -Recent 2D echo with a preserved EF, no diastolic dysfunction in May of this year, no indication to repeat -Currently TSH is controlled, but her medication  has been adjusted frequently where her levothyroxine was decreased from 1 50-1 25, so we will continue at the lower dose especially she is currently in A-fib with RVR -She received IV metoprolol and p.o. metoprolol, despite that heart rate in the 140s I will start on Cardizem drip overnight, and continue with increased dose metoprolol 50 mg oral twice daily (patient was on 12.5 mg p.o. twice daily) hopefully heart rate will be controlled in a.m. with Cardizem can be discontinued. -Patient can follow-up with cardiology as an outpatient -CHADS2 BASC score is 2 related to age and female sex, discussed with patient, agreeable to start anticoagulation, pharmacy consulted to start Eliquis.  History of PSVT -She is followed with [name], had Zio patch recently, she will continue on metoprolol at a higher dose.  Obesity Body mass index is 40.77 kg/m.  Hypothyroidism  -reportedly TSH level was high as an outpatient where her dose has been gradually decreased from 150 mcg>> 137.5 mcg>> 125 mcg (she did not start receiving this dose yet) current TSH within normal range, will continue at 125 mcg oral daily  Sleep apnea -Continue with CPAP   DVT Prophylaxis started on Eliquis AM Labs Ordered, also please review Full Orders  Family Communication: Admission, patients condition and plan of care including tests being ordered have been discussed with the patient and husband at bedside  who indicate understanding and agree with the plan and Code Status.  Code Status full code  Likely DC to  home  Condition GUARDED  Consults called: None  Admission status: Observation  Time spent in minutes : 70 minutes   Phillips Climes M.D on 09/09/2022 at 7:31 PM   [facility] - Office  [phone]

## 2022-09-09 NOTE — ED Provider Notes (Signed)
[facility] EMERGENCY DEPARTMENT Provider Note   CSN: [id number] Arrival date & time: 09/09/22  1434     History  Chief Complaint  Patient presents with   Palpitations    Maria Meza is a 66 y.o. female.    Patient presents to the ED for evaluation of palpitations.  Patient states she started having symptoms this past Wednesday.  She will have episodes of feeling her heart race.  It will come and go.  She starts to feel somewhat diaphoretic lightheaded and somewhat short of breath.  She has not had any syncope.  No vomiting or fever.  She tried to go to another emergency room but it was too long to wait.  Patient states she does have thyroid disease and recently had patient decreased.  She wonders if that could be related.  Patient has history of SVT but denies any history of atrial fibrillation or atrial flutter.  She is followed by [name], cardiology  Home Medications Prior to Admission medications   Medication Sig Start Date End Date Taking? Authorizing Provider  albuterol (VENTOLIN HFA) 108 (90 Base) MCG/ACT inhaler Inhale 1 puff into the lungs as needed. Patient not taking: Reported on 06/22/2022 04/07/14   [provider]  Ascorbic Acid (VITAMIN C) 1000 MG tablet Take 1,000 mg by mouth daily.    [provider]  b complex vitamins tablet Take by mouth daily.    [provider]  folic acid (FOLVITE) 1 MG tablet Take 1 mg by mouth. 2 tablets daily 04/21/11   [provider]  hydroxychloroquine (PLAQUENIL) 200 MG tablet Take 400 mg by mouth 2 (two) times daily.  Patient not taking: Reported on 06/22/2022 06/21/14   [provider]  latanoprost (XALATAN) 0.005 % ophthalmic solution Place 1 drop into both eyes daily.     [provider]  levothyroxine (SYNTHROID, LEVOTHROID) 150 MCG tablet Take 1 tablet by mouth daily. 04/01/18   [provider]  magnesium oxide (MAG-OX) 400 MG tablet Take 1 tablet (400 mg total) by  mouth 2 (two) times daily. 09/06/21   Leonie Man, MD  meclizine (ANTIVERT) 25 MG tablet Take 1 tablet by mouth as needed. Patient not taking: Reported on 06/22/2022 01/24/18   [provider]  metoprolol tartrate (LOPRESSOR) 25 MG tablet Take 0.5 tablets (12.5 mg total) by mouth 2 (two) times daily. IF YOU ARE HAVING A PARTICULARY BAD AFTERNOON OR EVENING OF PAPLITATIONS TAKE AN ADDITIONAL 12.5 MG TO 25 MG TABLET 06/22/22   Leonie Man, MD  mometasone (ELOCON) 0.1 % lotion Apply 1 application topically.    [provider]  Vitamin D, Ergocalciferol, (DRISDOL) 1.25 MG (50000 UNIT) CAPS capsule Take 50,000 Units by mouth once a week. 04/17/21   [provider]      Allergies    Gluten meal, Hydrocodone, Pacerone [amiodarone], Propafenone hcl, and Verapamil    Review of Systems   Review of Systems  Cardiovascular:  Positive for chest pain.    Physical Exam Updated Vital Signs BP (!) 110/58   Pulse 62   Temp 98 F (36.7 C) (Oral)   Resp 15   Ht 1.651 m (5' 5" )   Wt 111.1 kg   LMP 04/16/2016 (Exact Date)   SpO2 98%   BMI 40.77 kg/m  Physical Exam Vitals and nursing note reviewed.  Constitutional:      General: She is not in acute distress.    Appearance: She is well-developed.  HENT:  Head: Normocephalic and atraumatic.     Right Ear: External ear normal.     Left Ear: External ear normal.  Eyes:     General: No scleral icterus.       Right eye: No discharge.        Left eye: No discharge.     Conjunctiva/sclera: Conjunctivae normal.  Neck:     Trachea: No tracheal deviation.  Cardiovascular:     Rate and Rhythm: Tachycardia present. Rhythm irregular.  Pulmonary:     Effort: Pulmonary effort is normal. No respiratory distress.     Breath sounds: Normal breath sounds. No stridor. No wheezing or rales.  Abdominal:     General: Bowel sounds are normal. There is no distension.     Palpations: Abdomen is soft.     Tenderness: There is no  abdominal tenderness. There is no guarding or rebound.  Musculoskeletal:        General: No tenderness or deformity.     Cervical back: Neck supple.  Skin:    General: Skin is warm and dry.     Findings: No rash.  Neurological:     General: No focal deficit present.     Mental Status: She is alert.     Cranial Nerves: No cranial nerve deficit (no facial droop, extraocular movements intact, no slurred speech).     Sensory: No sensory deficit.     Motor: No abnormal muscle tone or seizure activity.     Coordination: Coordination normal.  Psychiatric:        Mood and Affect: Mood normal.     ED Results / Procedures / Treatments   Labs (all labs ordered are listed, but only abnormal results are displayed) Labs Reviewed  CBC WITH DIFFERENTIAL/PLATELET - Abnormal; Notable for the following components:      Result Value   Hemoglobin 15.4 (*)    All other components within normal limits  MAGNESIUM  TSH  COMPREHENSIVE METABOLIC PANEL  T4, FREE  TROPONIN I (HIGH SENSITIVITY)  TROPONIN I (HIGH SENSITIVITY)    EKG EKG Interpretation  Date/Time:  Friday September 09 2022 15:33:24 EDT Ventricular Rate:  125 PR Interval:    QRS Duration: 116 QT Interval:  320 QTC Calculation: 461 R Axis:   30 Text Interpretation: Atrial fibrillation with rapid ventricular response Low voltage QRS Right bundle branch block Abnormal ECG No previous ECGs available Confirmed by Dorie Rank [phone]) on 09/09/2022 3:38:17 PM  Radiology DG Chest 2 View  Result Date: 09/09/2022 CLINICAL DATA:  Chest pain EXAM: CHEST - 2 VIEW COMPARISON:  None Available. FINDINGS: The heart size and mediastinal contours are within normal limits. Both lungs are clear. The visualized skeletal structures are unremarkable. Surgical clips in the right upper quadrant IMPRESSION: No active cardiopulmonary disease. Electronically Signed   By: Marin Roberts M.D.   On: 09/09/2022 16:05    Procedures .Critical Care  Performed by:  Dorie Rank, MD Authorized by: Dorie Rank, MD   Critical care provider statement:    Critical care time (minutes):  35   Critical care was time spent personally by me on the following activities:  Development of treatment plan with patient or surrogate, discussions with consultants, evaluation of patient's response to treatment, examination of patient, ordering and review of laboratory studies, ordering and review of radiographic studies, ordering and performing treatments and interventions, pulse oximetry, re-evaluation of patient's condition and review of old charts     Medications Ordered in ED Medications  metoprolol tartrate (  LOPRESSOR) injection 5 mg (5 mg Intravenous Given 09/09/22 1644)  metoprolol tartrate (LOPRESSOR) tablet 50 mg (has no administration in time range)    ED Course/ Medical Decision Making/ A&P Clinical Course as of 09/09/22 1859  Fri Sep 09, 2022  1844 CBC with Differential(!) Normal [JK]  4854 Normal [place] panel TSH.  Normal troponin and magnesium [JK]  1844 Chest x-ray without acute findings [JK]  1857 Case discussed with Dr. Waldron Labs regarding admission [JK]    Clinical Course User Index [JK] Dorie Rank, MD       CHA2DS2-VASc Score: 2                    Medical Decision Making Problems Addressed: Atrial fibrillation with rapid ventricular response [facility]): acute illness or injury that poses a threat to life or bodily functions  Amount and/or Complexity of Data Reviewed Labs: ordered. Decision-making details documented in ED Course. Radiology: ordered and independent interpretation performed. Decision-making details documented in ED Course.  Risk Prescription drug management. Decision regarding hospitalization.   Patient presents to the ED for evaluation of palpitations. EKG consistent with atrial fibrillation.  Patient does have prior history of PSVT.  Patient has required IV doses of metoprolol and unfortunately still has episodes of tachycardia.   No signs of acute infection or electrolyte abnormality.  No findings to suggest thyrotoxicosis.  Patient would benefit from admission for rate control cardiology consultation.  I will consult the medical service for admission       Final Clinical Impression(s) / ED Diagnoses Final diagnoses:  Atrial fibrillation with rapid ventricular response [facility])    Rx / DC Orders ED Discharge Orders          Ordered    Amb referral to AFIB Clinic        09/09/22 1622              Dorie Rank, MD 09/09/22 1859

## 2022-09-09 NOTE — Telephone Encounter (Signed)
Took call from pt, she states that she is having episodes. She states that she is "fine" right now "maybe a little weak" the weakness happens after these "episodes". The episodes that starts when she stands up and goes to do something she feels chest pressure, , heart racing, DOE and sweating. Her BP is a little high 142/90 HR 75. This episode only lasts "a few minutes, like 1-3" it does not happen "all the time" just intermittently since she was at the ER on Wednesday. She cannot "link" to any specific activity, etc. The episode will go away after resting. She went to [place], [place] (Wednesday 9-13)and she went in there with these episodes. She states that they did an EKG, she was waiting for 6 hours, symptoms calmed down and she she "had to leave" before she was seen.  She will go to the ER at [facility] if these symptom worsen or happen again. She does have an appt scheduled for Monday with Dr Ellyn Hack. Will forward for review.

## 2022-09-09 NOTE — Progress Notes (Signed)
ANTICOAGULATION CONSULT NOTE - Initial Consult  Pharmacy Consult for apixaban Indication: atrial fibrillation  Allergies  Allergen Reactions   Gluten Meal     Pt has celiac disease.    Hydrocodone Nausea And Vomiting   Pacerone [Amiodarone] Other (See Comments)    "does not tolerate"--slows heart rate   Propafenone Hcl     Caused very low HR   Verapamil     Fatigue, constipation    Patient Measurements: Height: 5' 5"  (165.1 cm) Weight: 111.1 kg (245 lb) IBW/kg (Calculated) : 57   Vital Signs: Temp: 98.1 F (36.7 C) (09/15 1921) Temp Source: Oral (09/15 1921) BP: 137/80 (09/15 1919) Pulse Rate: 129 (09/15 1919)  Labs: Recent Labs    09/09/22 1547 09/09/22 1746  HGB 15.4*  --   HCT 45.9  --   PLT 379  --   CREATININE 1.00  --   TROPONINIHS 10 10    Estimated Creatinine Clearance: 67.7 mL/min (by C-G formula based on SCr of 1 mg/dL).   Medical History: Past Medical History:  Diagnosis Date   Absolute anemia    No longer on iron supplementation.   Adult celiac disease 2018   Anxiety    Arrhythmia 06/2016   Event monitor has shown PVCs, PSVT at 1 short burst of NSVT.;  Event monitor from March 2021: 6 short runs of wide-complex tachycardia and 25 runs of PSVT--intolerant of high-dose beta-blocker because of bradycardia.   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  (ANA positive, dsDNA negative)   Dysmenorrhea    Glaucoma    History of COVID-19 02/19/2020   IBS (irritable bowel syndrome)    Morbid obesity with BMI of 40.0-44.9, adult ([place]) 09/2020   BMI 43.07   MRSA infection 4-end-21   on abdomen   OSA on CPAP    Thyroid disease    hypothyroidism   TIA (transient ischemic attack)    2 per patient   Urinary incontinence    with sneezing, coughing     Assessment: 66 yo W with new  Afib RVR. No anticoagulation prior to admission. Pharmacy consulted for apixaban.   Scr 1, ClCr 67 ml/min.   Goal of Therapy:  Monitor platelets by anticoagulation protocol: Yes   Plan:  Start apixaban 34m BID   Monitor for signs/symptoms of bleeding    LBenetta Spar PharmD, BCPS, BCCP Clinical Pharmacist  Please check AMION for all MKatyphone numbers After 10:00 PM, call [phone]

## 2022-09-09 NOTE — Discharge Instructions (Signed)
Information on my medicine - ELIQUIS (apixaban)  This medication education was reviewed with me or my healthcare representative as part of my discharge preparation.     Why was Eliquis prescribed for you? Eliquis was prescribed for you to reduce the risk of a blood clot forming that can cause a stroke if you have a medical condition called atrial fibrillation (a type of irregular heartbeat).  What do You need to know about Eliquis ? Take your Eliquis TWICE DAILY - one tablet in the morning and one tablet in the evening with or without food. If you have difficulty swallowing the tablet whole please discuss with your pharmacist how to take the medication safely.  Take Eliquis exactly as prescribed by your doctor and DO NOT stop taking Eliquis without talking to the doctor who prescribed the medication.  Stopping may increase your risk of developing a stroke.  Refill your prescription before you run out.  After discharge, you should have regular check-up appointments with your healthcare provider that is prescribing your Eliquis.  In the future your dose may need to be changed if your kidney function or weight changes by a significant amount or as you get older.  What do you do if you miss a dose? If you miss a dose, take it as soon as you remember on the same day and resume taking twice daily.  Do not take more than one dose of ELIQUIS at the same time to make up a missed dose.  Important Safety Information A possible side effect of Eliquis is bleeding. You should call your healthcare provider right away if you experience any of the following: Bleeding from an injury or your nose that does not stop. Unusual colored urine (red or dark brown) or unusual colored stools (red or black). Unusual bruising for unknown reasons. A serious fall or if you hit your head (even if there is no bleeding).  Some medicines may interact with Eliquis and might increase your risk of bleeding or clotting  while on Eliquis. To help avoid this, consult your healthcare provider or pharmacist prior to using any new prescription or non-prescription medications, including herbals, vitamins, non-steroidal anti-inflammatory drugs (NSAIDs) and supplements.  This website has more information on Eliquis (apixaban): http://www.eliquis.com/eliquis/home =======================================  Atrial Fibrillation    Atrial fibrillation is a type of heartbeat that is irregular or fast. If you have this condition, your heart beats without any order. This makes it hard for your heart to pump blood in a normal way. Atrial fibrillation may come and go, or it may become a long-lasting problem. If this condition is not treated, it can put you at higher risk for stroke, heart failure, and other heart problems.  What are the causes? This condition may be caused by diseases that damage the heart. They include: High blood pressure. Heart failure. Heart valve disease. Heart surgery. Other causes include: Diabetes. Thyroid disease. Being overweight. Kidney disease. Sometimes the cause is not known.  What increases the risk? You are more likely to develop this condition if: You are older. You smoke. You exercise often and very hard. You have a family history of this condition. You are a man. You use drugs. You drink a lot of alcohol. You have lung conditions, such as emphysema, pneumonia, or COPD. You have sleep apnea.  What are the signs or symptoms? Common symptoms of this condition include: A feeling that your heart is beating very fast. Chest pain or discomfort. Feeling short of breath. Suddenly feeling  light-headed or weak. Getting tired easily during activity. Fainting. Sweating. In some cases, there are no symptoms.  How is this treated? Treatment for this condition depends on underlying conditions and how you feel when you have atrial fibrillation. They include: Medicines to: Prevent  blood clots. Treat heart rate or heart rhythm problems. Using devices, such as a pacemaker, to correct heart rhythm problems. Doing surgery to remove the part of the heart that sends bad signals. Closing an area where clots can form in the heart (left atrial appendage). In some cases, your doctor will treat other underlying conditions.  Follow these instructions at home:  Medicines Take over-the-counter and prescription medicines only as told by your doctor. Do not take any new medicines without first talking to your doctor. If you are taking blood thinners: Talk with your doctor before you take any medicines that have aspirin or NSAIDs, such as ibuprofen, in them. Take your medicine exactly as told by your doctor. Take it at the same time each day. Avoid activities that could hurt or bruise you. Follow instructions about how to prevent falls. Wear a bracelet that says you are taking blood thinners. Or, carry a card that lists what medicines you take. Lifestyle         Do not use any products that have nicotine or tobacco in them. These include cigarettes, e-cigarettes, and chewing tobacco. If you need help quitting, ask your doctor. Eat heart-healthy foods. Talk with your doctor about the right eating plan for you. Exercise regularly as told by your doctor. Do not drink alcohol. Lose weight if you are overweight. Do not use drugs, including cannabis.  General instructions If you have a condition that causes breathing to stop for a short period of time (apnea), treat it as told by your doctor. Keep a healthy weight. Do not use diet pills unless your doctor says they are safe for you. Diet pills may make heart problems worse. Keep all follow-up visits as told by your doctor. This is important.  Contact a doctor if: You notice a change in the speed, rhythm, or strength of your heartbeat. You are taking a blood-thinning medicine and you get more bruising. You get tired more easily  when you move or exercise. You have a sudden change in weight.  Get help right away if:    You have pain in your chest or your belly (abdomen). You have trouble breathing. You have side effects of blood thinners, such as blood in your vomit, poop (stool), or pee (urine), or bleeding that cannot stop. You have any signs of a stroke. "BE FAST" is an easy way to remember the main warning signs: B - Balance. Signs are dizziness, sudden trouble walking, or loss of balance. E - Eyes. Signs are trouble seeing or a change in how you see. F - Face. Signs are sudden weakness or loss of feeling in the face, or the face or eyelid drooping on one side. A - Arms. Signs are weakness or loss of feeling in an arm. This happens suddenly and usually on one side of the body. S - Speech. Signs are sudden trouble speaking, slurred speech, or trouble understanding what people say. T - Time. Time to call emergency services. Write down what time symptoms started. You have other signs of a stroke, such as: A sudden, very bad headache with no known cause. Feeling like you may vomit (nausea). Vomiting. A seizure.  These symptoms may be an emergency. Do not wait to  see if the symptoms will go away. Get medical help right away. Call your local emergency services (911 in the [place] Do not drive yourself to the hospital. Summary Atrial fibrillation is a type of heartbeat that is irregular or fast. You are at higher risk of this condition if you smoke, are older, have diabetes, or are overweight. Follow your doctor's instructions about medicines, diet, exercise, and follow-up visits. Get help right away if you have signs or symptoms of a stroke. Get help right away if you cannot catch your breath, or you have chest pain or discomfort. This information is not intended to replace advice given to you by your health care provider. Make sure you discuss any questions you have with your health care provider. Document  Revised: 06/05/2019 Document Reviewed: 06/05/2019 Elsevier Patient Education  [place].

## 2022-09-10 ENCOUNTER — Other Ambulatory Visit: Payer: Self-pay

## 2022-09-10 ENCOUNTER — Encounter (HOSPITAL_COMMUNITY): Payer: Self-pay | Admitting: Internal Medicine

## 2022-09-10 DIAGNOSIS — Z820 Family history of epilepsy and other diseases of the nervous system: Secondary | ICD-10-CM | POA: Diagnosis not present

## 2022-09-10 DIAGNOSIS — Z8616 Personal history of COVID-19: Secondary | ICD-10-CM | POA: Diagnosis not present

## 2022-09-10 DIAGNOSIS — G4730 Sleep apnea, unspecified: Secondary | ICD-10-CM | POA: Diagnosis not present

## 2022-09-10 DIAGNOSIS — F419 Anxiety disorder, unspecified: Secondary | ICD-10-CM | POA: Diagnosis present

## 2022-09-10 DIAGNOSIS — Z79890 Hormone replacement therapy (postmenopausal): Secondary | ICD-10-CM | POA: Diagnosis not present

## 2022-09-10 DIAGNOSIS — I361 Nonrheumatic tricuspid (valve) insufficiency: Secondary | ICD-10-CM | POA: Diagnosis not present

## 2022-09-10 DIAGNOSIS — K900 Celiac disease: Secondary | ICD-10-CM | POA: Diagnosis present

## 2022-09-10 DIAGNOSIS — E039 Hypothyroidism, unspecified: Secondary | ICD-10-CM | POA: Diagnosis present

## 2022-09-10 DIAGNOSIS — Z8673 Personal history of transient ischemic attack (TIA), and cerebral infarction without residual deficits: Secondary | ICD-10-CM | POA: Diagnosis not present

## 2022-09-10 DIAGNOSIS — G4733 Obstructive sleep apnea (adult) (pediatric): Secondary | ICD-10-CM | POA: Diagnosis present

## 2022-09-10 DIAGNOSIS — Z9842 Cataract extraction status, left eye: Secondary | ICD-10-CM | POA: Diagnosis not present

## 2022-09-10 DIAGNOSIS — Z823 Family history of stroke: Secondary | ICD-10-CM | POA: Diagnosis not present

## 2022-09-10 DIAGNOSIS — Z8249 Family history of ischemic heart disease and other diseases of the circulatory system: Secondary | ICD-10-CM | POA: Diagnosis not present

## 2022-09-10 DIAGNOSIS — Z888 Allergy status to other drugs, medicaments and biological substances status: Secondary | ICD-10-CM | POA: Diagnosis not present

## 2022-09-10 DIAGNOSIS — L259 Unspecified contact dermatitis, unspecified cause: Secondary | ICD-10-CM | POA: Diagnosis present

## 2022-09-10 DIAGNOSIS — Z9841 Cataract extraction status, right eye: Secondary | ICD-10-CM | POA: Diagnosis not present

## 2022-09-10 DIAGNOSIS — Z833 Family history of diabetes mellitus: Secondary | ICD-10-CM | POA: Diagnosis not present

## 2022-09-10 DIAGNOSIS — Z7901 Long term (current) use of anticoagulants: Secondary | ICD-10-CM | POA: Diagnosis not present

## 2022-09-10 DIAGNOSIS — Z9989 Dependence on other enabling machines and devices: Secondary | ICD-10-CM | POA: Diagnosis not present

## 2022-09-10 DIAGNOSIS — I4510 Unspecified right bundle-branch block: Secondary | ICD-10-CM | POA: Diagnosis present

## 2022-09-10 DIAGNOSIS — I4891 Unspecified atrial fibrillation: Secondary | ICD-10-CM | POA: Diagnosis present

## 2022-09-10 DIAGNOSIS — Z6841 Body Mass Index (BMI) 40.0 and over, adult: Secondary | ICD-10-CM | POA: Diagnosis not present

## 2022-09-10 DIAGNOSIS — Z8349 Family history of other endocrine, nutritional and metabolic diseases: Secondary | ICD-10-CM | POA: Diagnosis not present

## 2022-09-10 DIAGNOSIS — Z79899 Other long term (current) drug therapy: Secondary | ICD-10-CM | POA: Diagnosis not present

## 2022-09-10 DIAGNOSIS — I471 Supraventricular tachycardia: Secondary | ICD-10-CM | POA: Diagnosis not present

## 2022-09-10 DIAGNOSIS — Z8261 Family history of arthritis: Secondary | ICD-10-CM | POA: Diagnosis not present

## 2022-09-10 DIAGNOSIS — Z83438 Family history of other disorder of lipoprotein metabolism and other lipidemia: Secondary | ICD-10-CM | POA: Diagnosis not present

## 2022-09-10 LAB — CBC
HCT: 44.3 % (ref 36.0–46.0)
Hemoglobin: 14.7 g/dL (ref 12.0–15.0)
MCH: 30.5 pg (ref 26.0–34.0)
MCHC: 33.2 g/dL (ref 30.0–36.0)
MCV: 91.9 fL (ref 80.0–100.0)
Platelets: 367 10*3/uL (ref 150–400)
RBC: 4.82 MIL/uL (ref 3.87–5.11)
RDW: 13.2 % (ref 11.5–15.5)
WBC: 9.6 10*3/uL (ref 4.0–10.5)
nRBC: 0 % (ref 0.0–0.2)

## 2022-09-10 LAB — BASIC METABOLIC PANEL
Anion gap: 5 (ref 5–15)
BUN: 20 mg/dL (ref 8–23)
CO2: 27 mmol/L (ref 22–32)
Calcium: 9.3 mg/dL (ref 8.9–10.3)
Chloride: 109 mmol/L (ref 98–111)
Creatinine, Ser: 0.93 mg/dL (ref 0.44–1.00)
GFR, Estimated: >60 mL/min (ref >60)
Glucose, Bld: 94 mg/dL (ref 70–99)
Potassium: 3.9 mmol/L (ref 3.5–5.1)
Sodium: 141 mmol/L (ref 135–145)

## 2022-09-10 LAB — MRSA NEXT GEN BY PCR, NASAL: MRSA by PCR Next Gen: NOT DETECTED

## 2022-09-10 LAB — HIV ANTIBODY (ROUTINE TESTING W REFLEX): HIV Screen 4th Generation wRfx: NONREACTIVE

## 2022-09-10 MED ORDER — VITAMIN C 500 MG PO TABS
1000.0000 mg | ORAL_TABLET | Freq: Every day | ORAL | Status: DC
  Administered 2022-09-10 – 2022-09-15 (×6): 1000 mg via ORAL
  Filled 2022-09-10 (×6): qty 2

## 2022-09-10 MED ORDER — CHLORHEXIDINE GLUCONATE CLOTH 2 % EX PADS
6.0000 | MEDICATED_PAD | Freq: Every day | CUTANEOUS | Status: DC
  Administered 2022-09-11 – 2022-09-15 (×5): 6 via TOPICAL

## 2022-09-10 MED ORDER — LEVOTHYROXINE SODIUM 125 MCG PO TABS
125.0000 ug | ORAL_TABLET | Freq: Every day | ORAL | Status: DC
  Administered 2022-09-10 – 2022-09-15 (×6): 125 ug via ORAL
  Filled 2022-09-10 (×4): qty 1
  Filled 2022-09-10: qty 3
  Filled 2022-09-10: qty 1

## 2022-09-10 MED ORDER — ACETAMINOPHEN 325 MG PO TABS
650.0000 mg | ORAL_TABLET | ORAL | Status: DC | PRN
  Administered 2022-09-10: 650 mg via ORAL
  Filled 2022-09-10: qty 2

## 2022-09-10 MED ORDER — ONDANSETRON HCL 4 MG/2ML IJ SOLN: 4.0000 mg | Freq: Four times a day (QID) | INTRAMUSCULAR | Status: DC | PRN

## 2022-09-10 NOTE — Progress Notes (Signed)
PROGRESS NOTE   Maria Meza  UMP:[id number] DOB: 1956/09/25 DOA: 09/09/2022 PCP: Celene Squibb, MD   Chief Complaint  Patient presents with   Palpitations   Level of care: Stepdown  Brief Admission History:  66 y.o. female, with past medical history of hypothyroidism (dose recently adjusted), unspecified connective tissue disorder (she is not taking Plaquenil anymore), history of palpitation, SVTs, obesity, sleep apnea, she presents to ED secondary to complaints of palpitation, patient follows with her cardiologist [name] regarding that, where she had Zio patch, she is known to have history of SVT for which she is taking dose metoprolol, patient with hypothyroidism her Synthroid dose has been adjusted recently, she denies chest pain, but some times she reports lightheadedness and mild dyspnea.  In ED she was noted to be in A-fib with RVR, heart rate occasionally going up to 140s, she received IV metoprolol, p.o. metoprolol, despite that heart rate remains elevated, so [facility] hospitalist consulted to admit.   Assessment and Plan: A-fib with RVR -Presumably new onset -Recent 2D echo with a preserved EF, no diastolic dysfunction in May of this year, no indication to repeat -Currently TSH is controlled, but her medication has been adjusted frequently  -Continue IV cardizem infusion, and continue with increased dose metoprolol 50 mg oral twice daily (patient was on 12.5 mg p.o. twice daily).    -CHADS2VASC score is 2 related to age and female sex, discussed with patient, agreeable to start anticoagulation, pharmacy consulted to start apixaban   History of PSVT -She is followed with [name], had Zio patch recently, she will continue on metoprolol at a higher dose.   Obesity Body mass index is 40.77 kg/m.   Hypothyroidism  -reportedly TSH level was high as an outpatient where her dose has been gradually decreased from 150 mcg>> 137.5 mcg>> 125 mcg (she did not start receiving this  dose yet) current TSH within normal range, will continue at 125 mcg oral daily   Sleep apnea -Continue with CPAP    DVT Prophylaxis started on Eliquis AM Labs Ordered, also please review Full Orders  DVT prophylaxis: apixaban Code Status: Full  Family Communication: daughter  Disposition: anticipate home  The patient remains OBS appropriate and will d/c before 2 midnights.   Consultants:   Procedures:   Antimicrobials:    Subjective: Pt can still feel palpitations and weakness.  Objective: Vitals:   09/10/22 1100 09/10/22 1115 09/10/22 1130 09/10/22 1143  BP: 103/61  102/61   Pulse: (!) 59  76   Resp: 19 19 18    Temp:    98 F (36.7 C)  TempSrc:    Oral  SpO2: 92% 95% 96%   Weight:      Height:       No intake or output data in the 24 hours ending 09/10/22 1217 Filed Weights   09/09/22 1530  Weight: 111.1 kg   Examination:  General exam: Appears calm and comfortable  Respiratory system: Clear to auscultation. Respiratory effort normal. Cardiovascular system: irregularly irregular, normal S1 & S2 heard. trace pedal edema. Gastrointestinal system: Abdomen is nondistended, soft and nontender. No organomegaly or masses felt. Normal bowel sounds heard. Central nervous system: Alert and oriented. No focal neurological deficits. Extremities: Symmetric 5 x 5 power. Skin: No rashes, lesions or ulcers. Psychiatry: Judgement and insight appear normal. Mood & affect appropriate.   Data Reviewed: I have personally reviewed following labs and imaging studies  CBC: Recent Labs  Lab 09/09/22 1547 09/10/22 0435  WBC 9.4 9.6  NEUTROABS 5.7  --   HGB 15.4* 14.7  HCT 45.9 44.3  MCV 92.7 91.9  PLT 379 151    Basic Metabolic Panel: Recent Labs  Lab 09/09/22 1547 09/10/22 0435  NA 139 141  K 4.3 3.9  CL 105 109  CO2 27 27  GLUCOSE 98 94  BUN 20 20  CREATININE 1.00 0.93  CALCIUM 10.0 9.3  MG 2.2  --     CBG: No results for input(s): "GLUCAP" in the last 168  hours.  No results found for this or any previous visit (from the past 240 hour(s)).   Radiology Studies: DG Chest 2 View  Result Date: 09/09/2022 CLINICAL DATA:  Chest pain EXAM: CHEST - 2 VIEW COMPARISON:  None Available. FINDINGS: The heart size and mediastinal contours are within normal limits. Both lungs are clear. The visualized skeletal structures are unremarkable. Surgical clips in the right upper quadrant IMPRESSION: No active cardiopulmonary disease. Electronically Signed   By: Marin Roberts M.D.   On: 09/09/2022 16:05    Scheduled Meds:  apixaban  5 mg Oral BID   vitamin C  1,000 mg Oral Daily   B-complex with vitamin C  1 tablet Oral Daily   folic acid  2 mg Oral Daily   latanoprost  1 drop Both Eyes QHS   levothyroxine  125 mcg Oral Daily   magnesium oxide  400 mg Oral BID   metoprolol tartrate  50 mg Oral BID   [START ON 09/14/2022] Vitamin D (Ergocalciferol)  50,000 Units Oral Q Wed-1800   Continuous Infusions:  diltiazem (CARDIZEM) infusion 10 mg/hr (09/10/22 1139)    LOS: 0 days   Time spent: 35 mins  [place] [name], MD How to contact the [facility] Attending or Consulting provider [place] or covering provider during after hours [place], for this patient?  Check the care team in [facility] and look for a) attending/consulting TRH provider listed and b) the [facility] team listed Log into [email or web address] and use [place]'s universal password to access. If you do not have the password, please contact the hospital operator. Locate the [facility] provider you are looking for under [facility] and page to a number that you can be directly reached. If you still have difficulty reaching the provider, please page the [facility] (Director on Call) for the Hospitalists listed on amion for assistance.  09/10/2022, 12:17 PM

## 2022-09-10 NOTE — Progress Notes (Unsigned)
    Office Visit  error

## 2022-09-10 NOTE — ED Notes (Signed)
Pt in bed, sig other at bedside, pt reports her headache is better, pt a fib on monitor, states that she is ready to go upstairs.

## 2022-09-10 NOTE — Hospital Course (Addendum)
66 y.o. female, with past medical history of hypothyroidism (dose recently adjusted), unspecified connective tissue disorder (she is not taking Plaquenil anymore), history of palpitation, SVTs, obesity, sleep apnea, she presents to ED secondary to complaints of palpitation, patient follows with her cardiologist [name] regarding that, where she had Zio patch, she is known to have history of SVT for which she is taking dose metoprolol, patient with hypothyroidism her Synthroid dose has been adjusted recently, she denies chest pain, but some times she reports lightheadedness and mild dyspnea.  In ED she was noted to be in A-fib with RVR, heart rate occasionally going up to 140s, she received IV metoprolol, p.o. metoprolol, despite that heart rate remains elevated, so [facility] hospitalist consulted to admit. Patient was started on IV diltiazem and po metoprolol dose was titrated up.  Cardiology was consulted as her rates remained poorly controlled and BP was soft.  She underwent DCCV cardioversion on 9/20 and converted to sinus.  The patient maintained sinus rhythm after cardioversion.  She was cleared for discharge home on 09/15/2022 by cardiology to continue metoprolol 75 mg twice daily.

## 2022-09-11 DIAGNOSIS — I4891 Unspecified atrial fibrillation: Secondary | ICD-10-CM | POA: Diagnosis not present

## 2022-09-11 DIAGNOSIS — I471 Supraventricular tachycardia: Secondary | ICD-10-CM | POA: Diagnosis not present

## 2022-09-11 DIAGNOSIS — Z9989 Dependence on other enabling machines and devices: Secondary | ICD-10-CM | POA: Diagnosis not present

## 2022-09-11 DIAGNOSIS — G4733 Obstructive sleep apnea (adult) (pediatric): Secondary | ICD-10-CM | POA: Diagnosis not present

## 2022-09-11 LAB — BASIC METABOLIC PANEL
Anion gap: 3 — ABNORMAL LOW (ref 5–15)
BUN: 19 mg/dL (ref 8–23)
CO2: 31 mmol/L (ref 22–32)
Calcium: 9 mg/dL (ref 8.9–10.3)
Chloride: 105 mmol/L (ref 98–111)
Creatinine, Ser: 0.92 mg/dL (ref 0.44–1.00)
GFR, Estimated: >60 mL/min (ref >60)
Glucose, Bld: 93 mg/dL (ref 70–99)
Potassium: 4 mmol/L (ref 3.5–5.1)
Sodium: 139 mmol/L (ref 135–145)

## 2022-09-11 LAB — MAGNESIUM: Magnesium: 2.2 mg/dL (ref 1.7–2.4)

## 2022-09-11 MED ORDER — DILTIAZEM HCL 60 MG PO TABS: 60.0000 mg | ORAL_TABLET | Freq: Three times a day (TID) | ORAL | Status: DC

## 2022-09-11 MED ORDER — DILTIAZEM HCL 30 MG PO TABS
30.0000 mg | ORAL_TABLET | Freq: Three times a day (TID) | ORAL | Status: DC
  Administered 2022-09-11 (×2): 30 mg via ORAL
  Filled 2022-09-11 (×2): qty 1

## 2022-09-11 MED ORDER — DILTIAZEM HCL 30 MG PO TABS
30.0000 mg | ORAL_TABLET | Freq: Once | ORAL | Status: AC
  Administered 2022-09-11: 30 mg via ORAL
  Filled 2022-09-11: qty 1

## 2022-09-11 MED ORDER — DILTIAZEM HCL-DEXTROSE 125-5 MG/125ML-% IV SOLN (PREMIX): 5.0000 mg/h | INTRAVENOUS | Status: DC

## 2022-09-11 MED ORDER — METOPROLOL TARTRATE 5 MG/5ML IV SOLN
5.0000 mg | INTRAVENOUS | Status: DC | PRN
  Administered 2022-09-11 – 2022-09-13 (×2): 5 mg via INTRAVENOUS
  Filled 2022-09-11 (×2): qty 5

## 2022-09-11 MED ORDER — DILTIAZEM HCL 60 MG PO TABS
60.0000 mg | ORAL_TABLET | Freq: Four times a day (QID) | ORAL | Status: DC
  Administered 2022-09-11 – 2022-09-13 (×6): 60 mg via ORAL
  Filled 2022-09-11 (×6): qty 1

## 2022-09-11 NOTE — Progress Notes (Signed)
PROGRESS NOTE   Maria Meza  JYN:[id number] DOB: [birth year] DOA: 09/09/2022 PCP: Celene Squibb, MD   Chief Complaint  Patient presents with   Palpitations   Level of care: Telemetry  Brief Admission History:  66 y.o. female, with past medical history of hypothyroidism (dose recently adjusted), unspecified connective tissue disorder (she is not taking Plaquenil anymore), history of palpitation, SVTs, obesity, sleep apnea, she presents to ED secondary to complaints of palpitation, patient follows with her cardiologist [name] regarding that, where she had Zio patch, she is known to have history of SVT for which she is taking dose metoprolol, patient with hypothyroidism her Synthroid dose has been adjusted recently, she denies chest pain, but some times she reports lightheadedness and mild dyspnea.  In ED she was noted to be in A-fib with RVR, heart rate occasionally going up to 140s, she received IV metoprolol, p.o. metoprolol, despite that heart rate remains elevated, so [facility] hospitalist consulted to admit.   Assessment and Plan: A-fib with RVR -Presumably new onset -Recent 2D echo with a preserved EF, no diastolic dysfunction in May of this year, no indication to repeat now -Currently TSH is controlled, but her medication has been adjusted frequently  -Pt initially treated with IV cardizem infusion, transitioned to oral cardizem 30 mg TID and continue with increased dose metoprolol 50 mg oral twice daily (patient was on 12.5 mg p.o. twice daily prior to admission).    -CHADS2VASC score is 2 related to age and female sex, discussed with patient, agreeable to start anticoagulation, pharmacy consulted to start apixaban   History of PSVT -She is followed with [name], had Zio patch recently, she will continue on metoprolol at a higher dose.   Obesity Body mass index is 40.77 kg/m.   Hypothyroidism  -reportedly TSH level was high as an outpatient where her dose has been  gradually decreased from 150 mcg>> 137.5 mcg>> 125 mcg (she did not start receiving this dose yet) current TSH within normal range, will continue at 125 mcg oral daily   Sleep apnea -Continue with CPAP    DVT Prophylaxis started on Eliquis AM Labs Ordered, also please review Full Orders  DVT prophylaxis: apixaban Code Status: Full  Family Communication: daughter  Disposition: anticipate home     Consultants:  Cardiology 9/18  Procedures:   Antimicrobials:    Subjective: Pt continues to suffer palpitations and weakness.  Objective: Vitals:   09/11/22 0615 09/11/22 0630 09/11/22 0645 09/11/22 0700  BP: 108/64  100/71   Pulse: 67 92 (!) 49   Resp: 16 18 11    Temp:    98 F (36.7 C)  TempSrc:    Oral  SpO2: 95% 97% 98%   Weight:      Height:        Intake/Output Summary (Last 24 hours) at 09/11/2022 1143 Last data filed at 09/11/2022 8657 Gross per 24 hour  Intake 193.83 ml  Output --  Net 193.83 ml   Filed Weights   09/09/22 1530  Weight: 111.1 kg   Examination:  General exam: Appears calm and comfortable  Respiratory system: Clear to auscultation. Respiratory effort normal. Cardiovascular system: irregularly irregular, normal S1 & S2 heard. trace pedal edema. Gastrointestinal system: Abdomen is nondistended, soft and nontender. No organomegaly or masses felt. Normal bowel sounds heard. Central nervous system: Alert and oriented. No focal neurological deficits. Extremities: Symmetric 5 x 5 power. Skin: No rashes, lesions or ulcers. Psychiatry: Judgement and insight appear normal. Mood &  affect appropriate.   Data Reviewed: I have personally reviewed following labs and imaging studies  CBC: Recent Labs  Lab 09/09/22 1547 09/10/22 0435  WBC 9.4 9.6  NEUTROABS 5.7  --   HGB 15.4* 14.7  HCT 45.9 44.3  MCV 92.7 91.9  PLT 379 233    Basic Metabolic Panel: Recent Labs  Lab 09/09/22 1547 09/10/22 0435 09/11/22 0344  NA 139 141 139  K 4.3 3.9 4.0  CL  105 109 105  CO2 27 27 31   GLUCOSE 98 94 93  BUN 20 20 19   CREATININE 1.00 0.93 0.92  CALCIUM 10.0 9.3 9.0  MG 2.2  --  2.2    CBG: No results for input(s): "GLUCAP" in the last 168 hours.  Recent Results (from the past 240 hour(s))  MRSA Next Gen by PCR, Nasal     Status: None   Collection Time: 09/10/22  1:25 PM   Specimen: Nasal Mucosa; Nasal Swab  Result Value Ref Range Status   MRSA by PCR Next Gen NOT DETECTED NOT DETECTED Final    Comment: (NOTE) The GeneXpert MRSA Assay (FDA approved for NASAL specimens only), is one component of a comprehensive MRSA colonization surveillance program. It is not intended to diagnose MRSA infection nor to guide or monitor treatment for MRSA infections. Test performance is not FDA approved in patients less than 74 years old. Performed at [facility], [address] [place], [place] [address]      Radiology Studies: DG Chest 2 View  Result Date: 09/09/2022 CLINICAL DATA:  Chest pain EXAM: CHEST - 2 VIEW COMPARISON:  None Available. FINDINGS: The heart size and mediastinal contours are within normal limits. Both lungs are clear. The visualized skeletal structures are unremarkable. Surgical clips in the right upper quadrant IMPRESSION: No active cardiopulmonary disease. Electronically Signed   By: Marin Roberts M.D.   On: 09/09/2022 16:05    Scheduled Meds:  apixaban  5 mg Oral BID   vitamin C  1,000 mg Oral Daily   B-complex with vitamin C  1 tablet Oral Daily   Chlorhexidine Gluconate Cloth  6 each Topical Q0600   diltiazem  30 mg Oral S1U   folic acid  2 mg Oral Daily   latanoprost  1 drop Both Eyes QHS   levothyroxine  125 mcg Oral Daily   magnesium oxide  400 mg Oral BID   metoprolol tartrate  50 mg Oral BID   [START ON 09/14/2022] Vitamin D (Ergocalciferol)  50,000 Units Oral Q Wed-1800   Continuous Infusions:    LOS: 1 day   Time spent: 35 mins  Fue Cervenka [name], MD How to contact the [facility] Attending or Consulting provider  [place] or covering provider during after hours [place], for this patient?  Check the care team in [facility] and look for a) attending/consulting TRH provider listed and b) the [facility] team listed Log into [email or web address] and use [place]'s universal password to access. If you do not have the password, please contact the hospital operator. Locate the [facility] provider you are looking for under [facility] and page to a number that you can be directly reached. If you still have difficulty reaching the provider, please page the [facility] (Director on Call) for the Hospitalists listed on amion for assistance.  09/11/2022, 11:43 AM

## 2022-09-11 NOTE — Progress Notes (Signed)
Patient placed on CPAP.

## 2022-09-11 NOTE — Progress Notes (Signed)
09/11/2022 5:23 PM  HR no longer controlled after starting oral diltiazem.  Will start back on IV cardizem infusion.  Cancel transfer to telemetry and keep in stepdown ICU.    Murvin Natal, MD

## 2022-09-11 NOTE — Progress Notes (Signed)
Pt has developed blisters on her chest from ECG pads, ECG pads rotated and blistered/reddened area cleansed with normal saline.

## 2022-09-12 ENCOUNTER — Ambulatory Visit (INDEPENDENT_AMBULATORY_CARE_PROVIDER_SITE_OTHER): Payer: Medicare Other | Admitting: Nurse Practitioner

## 2022-09-12 DIAGNOSIS — I471 Supraventricular tachycardia: Secondary | ICD-10-CM

## 2022-09-12 DIAGNOSIS — G4733 Obstructive sleep apnea (adult) (pediatric): Secondary | ICD-10-CM | POA: Diagnosis not present

## 2022-09-12 DIAGNOSIS — Z9989 Dependence on other enabling machines and devices: Secondary | ICD-10-CM | POA: Diagnosis not present

## 2022-09-12 DIAGNOSIS — I4891 Unspecified atrial fibrillation: Secondary | ICD-10-CM | POA: Diagnosis not present

## 2022-09-12 MED ORDER — METOPROLOL TARTRATE 50 MG PO TABS
75.0000 mg | ORAL_TABLET | Freq: Two times a day (BID) | ORAL | Status: DC
  Administered 2022-09-12 – 2022-09-15 (×7): 75 mg via ORAL
  Filled 2022-09-12 (×7): qty 1

## 2022-09-12 NOTE — Consult Note (Signed)
Cardiology Consultation   Patient ID: Maria Meza MRN: [id number]; DOB: 08/02/1956  Admit date: 09/09/2022 Date of Consult: 09/12/2022  PCP:  Celene Squibb, [facility] [place] [facility] Cardiologist:  Glenetta Hew, MD   {    Patient Profile:   Maria Meza is a 66 y.o. female with a hx of PSVT who is being seen 09/12/2022 for the evaluation of new onset afib at the request of Dr [name].  History of Present Illness:   [name] 66 yo female history of PSVT, PVCs, OSA, presented with palpitations. In ER found to be in new onset afib with RVR, started on cardizem drip and admitted. Cardiology consulted to help manage new onset afib    WBC 9.4 Hgb 15.4 Plt 379 Mg 2.2 TSH 1.175 K 4.2 Cr 1 BUN 20  Trop 10-->10 CXR no acute process EKG afib RVR, RBBB  01/2022 echo: LVEF 60-65%, no WMAs, mild to mod LAE Past Medical History:  Diagnosis Date   Absolute anemia    No longer on iron supplementation.   Adult celiac disease 2018   Anxiety    Arrhythmia 06/2016   Event monitor has shown PVCs, PSVT at 1 short burst of NSVT.;  Event monitor from March 2021: 6 short runs of wide-complex tachycardia and 25 runs of PSVT--intolerant of high-dose beta-blocker because of bradycardia.   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  (ANA positive, dsDNA negative)   Dysmenorrhea    Glaucoma    History of COVID-19 02/19/2020   IBS (irritable bowel syndrome)    Morbid obesity with BMI of 40.0-44.9, adult ([place]) 09/2020   BMI 43.07   MRSA infection 4-end-21   on abdomen   OSA on CPAP    Thyroid disease    hypothyroidism   TIA (transient ischemic attack)    2 per patient   Urinary incontinence    with sneezing, coughing    Past Surgical History:  Procedure Laterality Date   CARDIAC EVENT MONITOR   06/2016   [facility]) Noted PVCs, PSVT and NSVT (1 episode of 20 beats). ->  PT evaluation suggested PVCs appear to have been completely interpolated.  Felt to be septal   CARDIAC MRI -ADENOSINE / STRESS  05/28/2020   [facility]): EF 69%. Normal LV size, thickness and function w/ NO RWMA  CO & CI 6.5 L/min & 3 L/min.      Normal RV size thickness and function.  No RV WMA or aneurysm.  No findings c/w  ARVC.  Both atria are mildly enlarged.  Trileaflet aortic valve with no evidence of stenosis.  Adenosine Stress Imaging: no  inducible myocardial ischemia OR prior infarction/scar or infiltrate. Normal AoV. Mid TR.   CATARACT EXTRACTION Right 03/2018   CATARACT EXTRACTION Left 03/2018   CHOLECYSTECTOMY     LAPAROSCOPIC TUBAL LIGATION  1986   NUCLEAR STRESS TEST  05/2014   EF 55%.  Mixed anteroseptal defect consistent with breast attenuation artifact.   TRANSTHORACIC ECHOCARDIOGRAM  08/2016   [facility]- Normal LV size and function-EF~55%.  No RWMA..  Normal LA pressures.  Normal RV function.  Trivial AR, PR and TR.  No stenoses.    TRANSTHORACIC ECHOCARDIOGRAM  02/14/2022   Difficult images.  Normal EF 60 to 65%.  No RWMA.  Normal diastolic parameters.  Normal RV size  and function.  Mild to moderate LA dilation.  Normal aortic and mitral valves.  Acing aorta measured roughly 38 mm.   ZIO PATCH CARDIAC EVENT MONITOR  02/2020   [facility] Rhytym: SR - min 46 - max 113 bpm, avg 64 bpm. Rare isolated PVC & PACs. 6 short NSVT runs (8-11 beats, 134-245 bpm); 25 runs of  PAT/SVT - fastest 9 beats (200 bpm), longest 10.6 sec (130 bpm). Noted Sx w/ PAT & NSVT during daylight hours, and only once at midnight.   ZIO PATCH MONITOR  01/2022   Predominant SR: Rate range 46-99 bpm, avg 61 bpm.  Rare PACs & PVCs (& rare couplets).  No bigeminy/trigeminy.  (Sx noted w/ PACs and PVCs - & a few Atrial Runs).  35 Atrial Runs + 1 "V. tach "run of 4 Narrow Complex beats; fastest atrial run 5 beats @ 193 bpm & longest 22 beats (12.1 sec) @ avg  108 bpm.     Inpatient Medications: Scheduled Meds:  apixaban  5 mg Oral BID   vitamin C  1,000 mg Oral Daily   B-complex with vitamin C  1 tablet Oral Daily   Chlorhexidine Gluconate Cloth  6 each Topical Q0600   diltiazem  60 mg Oral S0Y   folic acid  2 mg Oral Daily   latanoprost  1 drop Both Eyes QHS   levothyroxine  125 mcg Oral Daily   magnesium oxide  400 mg Oral BID   metoprolol tartrate  50 mg Oral BID   [START ON 09/14/2022] Vitamin D (Ergocalciferol)  50,000 Units Oral Q Wed-1800   Continuous Infusions:  diltiazem (CARDIZEM) infusion Stopped (09/11/22 1746)   PRN Meds: acetaminophen, metoprolol tartrate, ondansetron (ZOFRAN) IV  Allergies:    Allergies  Allergen Reactions   Latex Other (See Comments)    blisters   Gluten Meal     Pt has celiac disease.    Hydrocodone Nausea And Vomiting   Pacerone [Amiodarone] Other (See Comments)    "does not tolerate"--slows heart rate   Propafenone Hcl     Caused very low HR   Verapamil     Fatigue, constipation    Social History:   Social History   Socioeconomic History   Marital status: Married    Spouse name: Not on file   Number of children: Not on file   Years of education: Not on file   Highest education level: Not on file  Occupational History   Not on file  Tobacco Use   Smoking status: Never   Smokeless tobacco: Never  Vaping Use   Vaping Use: Never used  Substance and Sexual Activity   Alcohol use: No    Alcohol/week: 0.0 standard drinks of alcohol   Drug use: No   Sexual activity: Not Currently    Partners: Male    Birth control/protection: Post-menopausal  Other Topics Concern   Not on file  Social History Narrative   She lives in [place], [place]   Right handed    Caffeine use: 1 cup coffee every morning   Social Determinants of Health   Financial Resource Strain: Not on file  Food Insecurity: No Food Insecurity (09/10/2022)   Hunger Vital Sign    Worried About Running Out of Food in  the Last Year: Never true    Ran Out of Food in the Last Year: Never true  Transportation Needs: No Transportation Needs (09/10/2022)   PRAPARE - Hydrologist (Medical): No  Lack of Transportation (Non-Medical): No  Physical Activity: Not on file  Stress: Not on file  Social Connections: Not on file  Intimate Partner Violence: Not At Risk (09/10/2022)   Humiliation, Afraid, Rape, and Kick questionnaire    Fear of Current or Ex-Partner: No    Emotionally Abused: No    Physically Abused: No    Sexually Abused: No    Family History:    Family History  Problem Relation Age of Onset   Heart attack Father        dec age 63   Hypertension Father    Hypertension Mother    Hyperlipidemia Mother    Migraines Mother    Seizures Mother        epilepsy   Stroke Mother        TIA's   Thyroid disease Sister        hypothyroid   Thyroid disease Sister        hypothyroid   Rheum arthritis Maternal Grandmother    Migraines Maternal Grandmother    Cancer Maternal Grandfather 79       colon ca--DEC age 24   Diabetes Maternal Grandfather    Stroke Paternal Grandmother        multiple   Thyroid disease Paternal Grandmother        goiter-hypothyroid   Breast cancer Maternal Aunt      ROS:  Please see the history of present illness.   All other ROS reviewed and negative.     Physical Exam/Data:   Vitals:   09/12/22 0527 09/12/22 0600 09/12/22 0700 09/12/22 0800  BP: 120/85 (!) 94/55 113/61   Pulse: (!) 56 76 80   Resp: 16 17 (!) 22   Temp:    97.9 F (36.6 C)  TempSrc:    Axillary  SpO2: 95% 94% 96%   Weight:      Height:        Intake/Output Summary (Last 24 hours) at 09/12/2022 0807 Last data filed at 09/11/2022 1930 Gross per 24 hour  Intake 247.2 ml  Output --  Net 247.2 ml      09/12/2022    5:00 AM 09/09/2022    3:30 PM 06/22/2022   10:33 AM  Last 3 Weights  Weight (lbs) 245 lb 13 oz 245 lb 260 lb  Weight (kg) 111.5 kg 111.131 kg  117.935 kg     Body mass index is 40.91 kg/m.  General:  Well nourished, well developed, in no acute distress HEENT: normal Neck: no JVD Vascular: No carotid bruits; Distal pulses 2+ bilaterally Cardiac:  irreg, tachy Lungs:  clear to auscultation bilaterally, no wheezing, rhonchi or rales  Abd: soft, nontender, no hepatomegaly  Ext: no edema Musculoskeletal:  No deformities, BUE and BLE strength normal and equal Skin: warm and dry  Neuro:  CNs 2-12 intact, no focal abnormalities noted Psych:  Normal affect     Laboratory Data:  High Sensitivity Troponin:   Recent Labs  Lab 09/09/22 1547 09/09/22 1746  TROPONINIHS 10 10     Chemistry Recent Labs  Lab 09/09/22 1547 09/10/22 0435 09/11/22 0344  NA 139 141 139  K 4.3 3.9 4.0  CL 105 109 105  CO2 27 27 31   GLUCOSE 98 94 93  BUN 20 20 19   CREATININE 1.00 0.93 0.92  CALCIUM 10.0 9.3 9.0  MG 2.2  --  2.2  GFRNONAA >60 >60 >60  ANIONGAP 7 5 3*    Recent Labs  Lab 09/09/22  1547  PROT 7.8  ALBUMIN 4.6  AST 25  ALT 32  ALKPHOS 76  BILITOT 0.8   Lipids No results for input(s): "CHOL", "TRIG", "HDL", "LABVLDL", "LDLCALC", "CHOLHDL" in the last 168 hours.  Hematology Recent Labs  Lab 09/09/22 1547 09/10/22 0435  WBC 9.4 9.6  RBC 4.95 4.82  HGB 15.4* 14.7  HCT 45.9 44.3  MCV 92.7 91.9  MCH 31.1 30.5  MCHC 33.6 33.2  RDW 13.2 13.2  PLT 379 367   Thyroid  Recent Labs  Lab 09/09/22 1547  TSH 1.175  FREET4 0.86    BNPNo results for input(s): "BNP", "PROBNP" in the last 168 hours.  DDimer No results for input(s): "DDIMER" in the last 168 hours.   Radiology/Studies:  DG Chest 2 View  Result Date: 09/09/2022 CLINICAL DATA:  Chest pain EXAM: CHEST - 2 VIEW COMPARISON:  None Available. FINDINGS: The heart size and mediastinal contours are within normal limits. Both lungs are clear. The visualized skeletal structures are unremarkable. Surgical clips in the right upper quadrant IMPRESSION: No active  cardiopulmonary disease. Electronically Signed   By: Marin Roberts M.D.   On: 09/09/2022 16:05     Assessment and Plan:   1.Afib with RVR - new diagnosis this admission - oral dilt 33m every 6 hours, lopressor 555mbid - dilt was increased to 6031mvery 6 hours starting with last nights dose, follow rates today along with bp's. Will try lopressor 57m76md today. NPO tonight in case unable to rate control and requires TEE/DCCV - of note listed allergies to propafenone, amiodarone. Likely will need to have her establish with EP at discharge.  - CHADS2Vasc score is 2, started on eliquis 5mg 5m   2. History of PSVT - had been on lopressor at home.  - from notes appears some fatigue on higher lopressor dosing in the past      For questions or updates, please contact [facility] consult [email or web address] for contact info under    Signed, BrancCarlyle Dolly 09/12/2022 8:07 AM

## 2022-09-12 NOTE — Progress Notes (Signed)
PROGRESS NOTE   Maria Meza  YYT:[id number] DOB: [birth year] DOA: 09/09/2022 PCP: Maria Squibb, MD   Chief Complaint  Patient presents with   Palpitations   Level of care: Stepdown  Brief Admission History:  66 y.o. female, with past medical history of hypothyroidism (dose recently adjusted), unspecified connective tissue disorder (she is not taking Plaquenil anymore), history of palpitation, SVTs, obesity, sleep apnea, she presents to ED secondary to complaints of palpitation, patient follows with her cardiologist [name] regarding that, where she had Zio patch, she is known to have history of SVT for which she is taking dose metoprolol, patient with hypothyroidism her Synthroid dose has been adjusted recently, she denies chest pain, but some times she reports lightheadedness and mild dyspnea.  In ED she was noted to be in A-fib with RVR, heart rate occasionally going up to 140s, she received IV metoprolol, p.o. metoprolol, despite that heart rate remains elevated, so [facility] hospitalist consulted to admit.   Assessment and Plan: A-fib with RVR -Presumably new onset -Recent 2D echo with a preserved EF, no diastolic dysfunction in May of this year, no indication to repeat now -Currently TSH is controlled, but her medication has been adjusted frequently  -Pt initially treated with IV cardizem infusion, transitioned to oral cardizem 60 mg TID and continue with increased dose metoprolol 50 mg oral twice daily (patient was on 12.5 mg p.o. twice daily prior to admission).   Cardiology increased metoprolol to 75 mg BID on 9/18 Pending response to medical treatment may require cardioversion per cardiology team  -CHADS2VASC score is 2 related to age and female sex, discussed with patient, agreeable to start anticoagulation, pharmacy consulted to start apixaban   History of PSVT -She is followed with [name], had Zio patch recently, she will continue on metoprolol at a higher dose.    Obesity Body mass index is 40.77 kg/m.   Hypothyroidism  -reportedly TSH level was high as an outpatient where her dose has been gradually decreased from 150 mcg>> 137.5 mcg>> 125 mcg (she did not start receiving this dose yet) current TSH within normal range, will continue at 125 mcg oral daily   Sleep apnea -Continue with CPAP    DVT Prophylaxis started on Eliquis AM Labs Ordered, also please review Full Orders  DVT prophylaxis: apixaban Code Status: Full  Family Communication: daughter  Disposition: anticipate home     Consultants:  Cardiology 9/18  Procedures:   Antimicrobials:    Subjective: Pt reports weakness, palpitations.   Objective: Vitals:   09/12/22 0800 09/12/22 0925 09/12/22 1200 09/12/22 1216  BP: (!) 106/45 (!) 120/58  (!) 121/57  Pulse: 86 97    Resp: (!) 23     Temp: 97.9 F (36.6 C)  (!) 96.4 F (35.8 C)   TempSrc: Axillary  Axillary   SpO2: 98%     Weight:      Height:        Intake/Output Summary (Last 24 hours) at 09/12/2022 1407 Last data filed at 09/11/2022 1930 Gross per 24 hour  Intake 240 ml  Output --  Net 240 ml   Filed Weights   09/09/22 1530 09/12/22 0500  Weight: 111.1 kg 111.5 kg   Examination:  General exam: Appears calm and comfortable  Respiratory system: Clear to auscultation. Respiratory effort normal. Cardiovascular system: irregularly irregular, normal S1 & S2 heard. trace pedal edema. Gastrointestinal system: Abdomen is nondistended, soft and nontender. No organomegaly or masses felt. Normal bowel sounds heard. Central  nervous system: Alert and oriented. No focal neurological deficits. Extremities: Symmetric 5 x 5 power. Skin: No rashes, lesions or ulcers. Psychiatry: Judgement and insight appear normal. Mood & affect appropriate.   Data Reviewed: I have personally reviewed following labs and imaging studies  CBC: Recent Labs  Lab 09/09/22 1547 09/10/22 0435  WBC 9.4 9.6  NEUTROABS 5.7  --   HGB 15.4*  14.7  HCT 45.9 44.3  MCV 92.7 91.9  PLT 379 536    Basic Metabolic Panel: Recent Labs  Lab 09/09/22 1547 09/10/22 0435 09/11/22 0344  NA 139 141 139  K 4.3 3.9 4.0  CL 105 109 105  CO2 27 27 31   GLUCOSE 98 94 93  BUN 20 20 19   CREATININE 1.00 0.93 0.92  CALCIUM 10.0 9.3 9.0  MG 2.2  --  2.2    CBG: No results for input(s): "GLUCAP" in the last 168 hours.  Recent Results (from the past 240 hour(s))  MRSA Next Gen by PCR, Nasal     Status: None   Collection Time: 09/10/22  1:25 PM   Specimen: Nasal Mucosa; Nasal Swab  Result Value Ref Range Status   MRSA by PCR Next Gen NOT DETECTED NOT DETECTED Final    Comment: (NOTE) The GeneXpert MRSA Assay (FDA approved for NASAL specimens only), is one component of a comprehensive MRSA colonization surveillance program. It is not intended to diagnose MRSA infection nor to guide or monitor treatment for MRSA infections. Test performance is not FDA approved in patients less than 55 years old. Performed at [facility], [address], [place], [place] [address]      Radiology Studies: No results found.  Scheduled Meds:  apixaban  5 mg Oral BID   vitamin C  1,000 mg Oral Daily   B-complex with vitamin C  1 tablet Oral Daily   Chlorhexidine Gluconate Cloth  6 each Topical Q0600   diltiazem  60 mg Oral V4M   folic acid  2 mg Oral Daily   latanoprost  1 drop Both Eyes QHS   levothyroxine  125 mcg Oral Daily   magnesium oxide  400 mg Oral BID   metoprolol tartrate  75 mg Oral BID   [START ON 09/14/2022] Vitamin D (Ergocalciferol)  50,000 Units Oral Q Wed-1800   Continuous Infusions:   LOS: 2 days   Time spent: 35 mins  [name], MD How to contact the [facility] Attending or Consulting provider [place] or covering provider during after hours [place], for this patient?  Check the care team in [facility] and look for a) attending/consulting TRH provider listed and b) the [facility] team listed Log into [email or web address] and use [place]'s  universal password to access. If you do not have the password, please contact the hospital operator. Locate the [facility] provider you are looking for under [facility] and page to a number that you can be directly reached. If you still have difficulty reaching the provider, please page the [facility] (Director on Call) for the Hospitalists listed on amion for assistance.  09/12/2022, 2:07 PM

## 2022-09-12 NOTE — TOC Progression Note (Signed)
  Transition of Care [facility]) Screening Note   Patient Details  Name: Maria Meza Date of Birth: [birth year]   Transition of [facility]) CM/SW Contact:    Shade Flood, LCSW Phone Number: 09/12/2022, 1:31 PM    Transition of Care Department [facility]) has reviewed patient and no TOC needs have been identified at this time. We will continue to monitor patient advancement through interdisciplinary progression rounds. If new patient transition needs arise, please place a TOC consult.

## 2022-09-13 ENCOUNTER — Other Ambulatory Visit (HOSPITAL_COMMUNITY): Payer: Self-pay

## 2022-09-13 DIAGNOSIS — I4891 Unspecified atrial fibrillation: Secondary | ICD-10-CM | POA: Diagnosis not present

## 2022-09-13 DIAGNOSIS — G4733 Obstructive sleep apnea (adult) (pediatric): Secondary | ICD-10-CM | POA: Diagnosis not present

## 2022-09-13 DIAGNOSIS — Z9989 Dependence on other enabling machines and devices: Secondary | ICD-10-CM | POA: Diagnosis not present

## 2022-09-13 NOTE — Progress Notes (Signed)
around 1315, patient complained of feeling a hot flash and felt like she was sweating for about 30 seconds. Patient states she used to get these symptoms whenever her heart rate and/or blood pressure was elevating. Blood pressure was 107/69, heart rate was in the 90's (a-fib) on telemetry. It was only about 30 seconds long per patient and she states she's fine now. She says she felt better once she drank some cold water. It looks like patient has some PVC's at times too. Dr [name] and Dr Harl Bowie made aware.

## 2022-09-13 NOTE — Progress Notes (Addendum)
Rounding Note    Patient Name: Maria Meza Date of Encounter: 09/13/2022  [place] Cardiologist: Glenetta Hew, MD   Subjective   Ongoing palpitaitons.   Inpatient Medications    Scheduled Meds:  apixaban  5 mg Oral BID   vitamin C  1,000 mg Oral Daily   B-complex with vitamin C  1 tablet Oral Daily   Chlorhexidine Gluconate Cloth  6 each Topical Q0600   diltiazem  60 mg Oral X4G   folic acid  2 mg Oral Daily   latanoprost  1 drop Both Eyes QHS   levothyroxine  125 mcg Oral Daily   magnesium oxide  400 mg Oral BID   metoprolol tartrate  75 mg Oral BID   [START ON 09/14/2022] Vitamin D (Ergocalciferol)  50,000 Units Oral Q Wed-1800   Continuous Infusions:  PRN Meds: acetaminophen, metoprolol tartrate, ondansetron (ZOFRAN) IV   Vital Signs    Vitals:   09/13/22 0600 09/13/22 0700 09/13/22 0742 09/13/22 0800  BP: (!) 151/77 112/73  (!) 114/53  Pulse: 72 86    Resp: 16 15  15   Temp:   (!) 97.1 F (36.2 C)   TempSrc:   Axillary   SpO2: 97% 95%  97%  Weight:      Height:       No intake or output data in the 24 hours ending 09/13/22 0822    09/12/2022    5:00 AM 09/09/2022    3:30 PM 06/22/2022   10:33 AM  Last 3 Weights  Weight (lbs) 245 lb 13 oz 245 lb 260 lb  Weight (kg) 111.5 kg 111.131 kg 117.935 kg      Telemetry    Afib variable rates.  - Personally Reviewed  ECG    N/a - Personally Reviewed  Physical Exam   GEN: No acute distress.   Neck: No JVD Cardiac: irreg Respiratory: Clear to auscultation bilaterally. GI: Soft, nontender, non-distended  MS: No edema; No deformity. Neuro:  Nonfocal  Psych: Normal affect   Labs    High Sensitivity Troponin:   Recent Labs  Lab 09/09/22 1547 09/09/22 1746  TROPONINIHS 10 10     Chemistry Recent Labs  Lab 09/09/22 1547 09/10/22 0435 09/11/22 0344  NA 139 141 139  K 4.3 3.9 4.0  CL 105 109 105  CO2 27 27 31   GLUCOSE 98 94 93  BUN 20 20 19   CREATININE 1.00 0.93 0.92   CALCIUM 10.0 9.3 9.0  MG 2.2  --  2.2  PROT 7.8  --   --   ALBUMIN 4.6  --   --   AST 25  --   --   ALT 32  --   --   ALKPHOS 76  --   --   BILITOT 0.8  --   --   GFRNONAA >60 >60 >60  ANIONGAP 7 5 3*    Lipids No results for input(s): "CHOL", "TRIG", "HDL", "LABVLDL", "LDLCALC", "CHOLHDL" in the last 168 hours.  Hematology Recent Labs  Lab 09/09/22 1547 09/10/22 0435  WBC 9.4 9.6  RBC 4.95 4.82  HGB 15.4* 14.7  HCT 45.9 44.3  MCV 92.7 91.9  MCH 31.1 30.5  MCHC 33.6 33.2  RDW 13.2 13.2  PLT 379 367   Thyroid  Recent Labs  Lab 09/09/22 1547  TSH 1.175  FREET4 0.86    BNPNo results for input(s): "BNP", "PROBNP" in the last 168 hours.  DDimer No results for input(s): "DDIMER"  in the last 168 hours.   Radiology    No results found.  Cardiac Studies   01/2022 echo 1. Difficult acoustic windows. Difficult to see endocardium . Left  ventricular ejection fraction, by estimation, is 60 to 65%. The left  ventricle has normal function. Left ventricular diastolic parameters were  normal.   2. Right ventricular systolic function is normal. The right ventricular  size is normal.   3. Left atrial size was mild to moderately dilated.   4. The mitral valve is normal in structure. Trivial mitral valve  regurgitation.   5. The aortic valve is tricuspid. Aortic valve regurgitation is not  visualized.   6. Aortic dilatation noted. There is borderline dilatation of the  ascending aorta, measuring 38 mm.   Patient Profile     Maria Meza is a 66 y.o. female with a hx of PSVT who is being seen 09/12/2022 for the evaluation of new onset afib at the request of Dr [name].  Assessment & Plan    1.Afib with RVR - new diagnosis this admission - currently on oral dilt 31m every 6 hours, lopressor 75 mg bid - of note listed allergies to propafenone, amiodarone. Tried in the past for SVT.  -soft bp's at times but overall tolerating av nodal agent dosing rates varaible 80s to  130s, ongoing palpitaitons.   - reaching ceiling for the dosing of av nodal agents she will tolerate due to bp, ongoing tachycardia and palpitations - plan for TEE/DCCV today. Give AM lopressor, hold diltiazem for now and reassess after cardioversion - Likely will need to have her establish with EP at discharge for afib given limitations on rate control and prior issues with antiarrhythmic therapy for her SVT - CHADS2Vasc score is 2, started on eliquis 549mbid     2. History of PSVT - had been on lopressor at home.  - from notes appears some fatigue on higher lopressor dosing in the past      Addendum: no availability in endo for TEE/DCCV today. Scheduled for tomorrow at 1145 AM. Rates 80s this AM, continue oral lopressor, we held her oral dilt. Follow rates and can add back if needed.       For questions or updates, please contact [place] consult [email or web address] for contact info under        Signed, BrCarlyle DollyMD  09/13/2022, 8:22 AM

## 2022-09-13 NOTE — Progress Notes (Signed)
PROGRESS NOTE   Maria Meza  VFI:[id number] DOB: October 13, 1956 DOA: 09/09/2022 PCP: Celene Squibb, MD   Chief Complaint  Patient presents with   Palpitations   Level of care: Stepdown  Brief Admission History:  66 y.o. female, with past medical history of hypothyroidism (dose recently adjusted), unspecified connective tissue disorder (she is not taking Plaquenil anymore), history of palpitation, SVTs, obesity, sleep apnea, she presents to ED secondary to complaints of palpitation, patient follows with her cardiologist [name] regarding that, where she had Zio patch, she is known to have history of SVT for which she is taking dose metoprolol, patient with hypothyroidism her Synthroid dose has been adjusted recently, she denies chest pain, but some times she reports lightheadedness and mild dyspnea.  In ED she was noted to be in A-fib with RVR, heart rate occasionally going up to 140s, she received IV metoprolol, p.o. metoprolol, despite that heart rate remains elevated, so [facility] hospitalist consulted to admit.   Assessment and Plan: A-fib with RVR -Presumably new onset -Recent 2D echo with a preserved EF, no diastolic dysfunction in May of this year, no indication to repeat now -Currently TSH is controlled, but her medication has been adjusted frequently  -Pt initially treated with IV cardizem infusion, transitioned to oral cardizem 60 mg TID and continue with increased dose metoprolol 50 mg oral twice daily (patient was on 12.5 mg p.o. twice daily prior to admission).   Cardiology increased metoprolol to 75 mg BID on 9/18 Cardiology planning DCCV today  -CHADS2VASC score is 2 related to age and female sex, discussed with patient, agreeable to start anticoagulation, pharmacy consulted to dose apixaban   History of PSVT -She is followed with [name], had Zio patch recently, she will continue on metoprolol at a higher dose.   Obesity Body mass index is 40.77 kg/m.    Hypothyroidism  -reportedly TSH level was high as an outpatient where her dose has been gradually decreased from 150 mcg>> 137.5 mcg>> 125 mcg (she did not start receiving this dose yet) current TSH within normal range, will continue at 125 mcg oral daily   Sleep apnea -Continue with nightly CPAP    DVT prophylaxis: apixaban Code Status: Full  Family Communication: husband bedside 9/19  Disposition: anticipate home when stabilized    Consultants:  Cardiology 9/18  Procedures:   Antimicrobials:    Subjective: Pt agreeable to having DCCV today with cardiology team, she still has palpitations.    Objective: Vitals:   09/13/22 0600 09/13/22 0700 09/13/22 0742 09/13/22 0800  BP: (!) 151/77 112/73  (!) 114/53  Pulse: 72 86    Resp: 16 15  15   Temp:   (!) 97.1 F (36.2 C)   TempSrc:   Axillary   SpO2: 97% 95%  97%  Weight:      Height:       No intake or output data in the 24 hours ending 09/13/22 0926  Filed Weights   09/09/22 1530 09/12/22 0500  Weight: 111.1 kg 111.5 kg   Examination:  General exam: Appears calm and comfortable  Respiratory system: Clear to auscultation. Respiratory effort normal. Cardiovascular system: irregularly irregular, normal S1 & S2 heard. trace pedal edema. Gastrointestinal system: Abdomen is nondistended, soft and nontender. No organomegaly or masses felt. Normal bowel sounds heard. Central nervous system: Alert and oriented. No focal neurological deficits. Extremities: Symmetric 5 x 5 power. Skin: No rashes, lesions or ulcers. Psychiatry: Judgement and insight appear normal. Mood & affect appropriate.  Data Reviewed: I have personally reviewed following labs and imaging studies  CBC: Recent Labs  Lab 09/09/22 1547 09/10/22 0435  WBC 9.4 9.6  NEUTROABS 5.7  --   HGB 15.4* 14.7  HCT 45.9 44.3  MCV 92.7 91.9  PLT 379 161    Basic Metabolic Panel: Recent Labs  Lab 09/09/22 1547 09/10/22 0435 09/11/22 0344  NA 139 141 139   K 4.3 3.9 4.0  CL 105 109 105  CO2 27 27 31   GLUCOSE 98 94 93  BUN 20 20 19   CREATININE 1.00 0.93 0.92  CALCIUM 10.0 9.3 9.0  MG 2.2  --  2.2    CBG: No results for input(s): "GLUCAP" in the last 168 hours.  Recent Results (from the past 240 hour(s))  MRSA Next Gen by PCR, Nasal     Status: None   Collection Time: 09/10/22  1:25 PM   Specimen: Nasal Mucosa; Nasal Swab  Result Value Ref Range Status   MRSA by PCR Next Gen NOT DETECTED NOT DETECTED Final    Comment: (NOTE) The GeneXpert MRSA Assay (FDA approved for NASAL specimens only), is one component of a comprehensive MRSA colonization surveillance program. It is not intended to diagnose MRSA infection nor to guide or monitor treatment for MRSA infections. Test performance is not FDA approved in patients less than 2 years old. Performed at [facility], [address], [place], [place] [address]      Radiology Studies: No results found.  Scheduled Meds:  apixaban  5 mg Oral BID   vitamin C  1,000 mg Oral Daily   B-complex with vitamin C  1 tablet Oral Daily   Chlorhexidine Gluconate Cloth  6 each Topical V4098   folic acid  2 mg Oral Daily   latanoprost  1 drop Both Eyes QHS   levothyroxine  125 mcg Oral Daily   magnesium oxide  400 mg Oral BID   metoprolol tartrate  75 mg Oral BID   [START ON 09/14/2022] Vitamin D (Ergocalciferol)  50,000 Units Oral Q Wed-1800   Continuous Infusions:   LOS: 3 days   Time spent: 35 mins  Bralyn Folkert [name], MD How to contact the [facility] Attending or Consulting provider [place] or covering provider during after hours [place], for this patient?  Check the care team in [facility] and look for a) attending/consulting [facility] provider listed and b) the [facility] team listed Log into [email or web address] and use [place]'s universal password to access. If you do not have the password, please contact the hospital operator. Locate the [facility] provider you are looking for under [facility] and page to a  number that you can be directly reached. If you still have difficulty reaching the provider, please page the [facility] (Director on Call) for the Hospitalists listed on amion for assistance.  09/13/2022, 9:26 AM

## 2022-09-14 ENCOUNTER — Other Ambulatory Visit: Payer: Self-pay

## 2022-09-14 ENCOUNTER — Encounter (HOSPITAL_COMMUNITY)
Admission: EM | Disposition: A | Discharge status: Home / Self Care | Payer: Self-pay | Source: Home / Self Care | Attending: Family Medicine

## 2022-09-14 ENCOUNTER — Inpatient Hospital Stay (HOSPITAL_COMMUNITY): Payer: Medicare Other

## 2022-09-14 ENCOUNTER — Inpatient Hospital Stay (HOSPITAL_COMMUNITY): Payer: Medicare Other | Admitting: Certified Registered Nurse Anesthetist

## 2022-09-14 ENCOUNTER — Encounter (HOSPITAL_COMMUNITY): Payer: Self-pay | Admitting: Family Medicine

## 2022-09-14 DIAGNOSIS — G4733 Obstructive sleep apnea (adult) (pediatric): Secondary | ICD-10-CM | POA: Diagnosis not present

## 2022-09-14 DIAGNOSIS — I361 Nonrheumatic tricuspid (valve) insufficiency: Secondary | ICD-10-CM

## 2022-09-14 DIAGNOSIS — G4730 Sleep apnea, unspecified: Secondary | ICD-10-CM

## 2022-09-14 DIAGNOSIS — I4891 Unspecified atrial fibrillation: Secondary | ICD-10-CM

## 2022-09-14 DIAGNOSIS — I471 Supraventricular tachycardia: Secondary | ICD-10-CM | POA: Diagnosis not present

## 2022-09-14 DIAGNOSIS — Z8673 Personal history of transient ischemic attack (TIA), and cerebral infarction without residual deficits: Secondary | ICD-10-CM

## 2022-09-14 DIAGNOSIS — Z9989 Dependence on other enabling machines and devices: Secondary | ICD-10-CM | POA: Diagnosis not present

## 2022-09-14 DIAGNOSIS — Z7901 Long term (current) use of anticoagulants: Secondary | ICD-10-CM

## 2022-09-14 DIAGNOSIS — F419 Anxiety disorder, unspecified: Secondary | ICD-10-CM

## 2022-09-14 HISTORY — PX: CARDIOVERSION: SHX1299

## 2022-09-14 HISTORY — PX: TEE WITHOUT CARDIOVERSION: SHX5443

## 2022-09-14 LAB — BASIC METABOLIC PANEL
Anion gap: 6 (ref 5–15)
BUN: 23 mg/dL (ref 8–23)
CO2: 29 mmol/L (ref 22–32)
Calcium: 9.7 mg/dL (ref 8.9–10.3)
Chloride: 107 mmol/L (ref 98–111)
Creatinine, Ser: 1.03 mg/dL — ABNORMAL HIGH (ref 0.44–1.00)
GFR, Estimated: 60 mL/min — ABNORMAL LOW (ref >60)
Glucose, Bld: 93 mg/dL (ref 70–99)
Potassium: 4.3 mmol/L (ref 3.5–5.1)
Sodium: 142 mmol/L (ref 135–145)

## 2022-09-14 LAB — MAGNESIUM: Magnesium: 2.3 mg/dL (ref 1.7–2.4)

## 2022-09-14 SURGERY — CARDIOVERSION
Anesthesia: General

## 2022-09-14 MED ORDER — PHENYLEPHRINE 80 MCG/ML (10ML) SYRINGE FOR IV PUSH (FOR BLOOD PRESSURE SUPPORT)
PREFILLED_SYRINGE | INTRAVENOUS | Status: DC | PRN
  Administered 2022-09-14 (×2): 80 ug via INTRAVENOUS

## 2022-09-14 MED ORDER — TRIAMCINOLONE ACETONIDE 0.1 % EX CREA
TOPICAL_CREAM | Freq: Two times a day (BID) | CUTANEOUS | Status: DC
  Administered 2022-09-14: 1 via TOPICAL
  Filled 2022-09-14: qty 15

## 2022-09-14 MED ORDER — PROPOFOL 500 MG/50ML IV EMUL
INTRAVENOUS | Status: DC | PRN
  Administered 2022-09-14: 20 mg via INTRAVENOUS
  Administered 2022-09-14: 50 mg via INTRAVENOUS

## 2022-09-14 MED ORDER — LACTATED RINGERS IV SOLN: INTRAVENOUS | Status: DC | PRN

## 2022-09-14 MED ORDER — LIDOCAINE 2% (20 MG/ML) 5 ML SYRINGE
INTRAMUSCULAR | Status: DC | PRN
  Administered 2022-09-14: 50 mg via INTRAVENOUS

## 2022-09-14 NOTE — Progress Notes (Signed)
PROGRESS NOTE  Maria Meza RDE:[id number] DOB: 14-Mar-1956 DOA: 09/09/2022 PCP: Celene Squibb, MD  Brief History:  66 y.o. female, with past medical history of hypothyroidism (dose recently adjusted), unspecified connective tissue disorder (she is not taking Plaquenil anymore), history of palpitation, SVTs, obesity, sleep apnea, she presents to ED secondary to complaints of palpitation, patient follows with her cardiologist [name] regarding that, where she had Zio patch, she is known to have history of SVT for which she is taking dose metoprolol, patient with hypothyroidism her Synthroid dose has been adjusted recently, she denies chest pain, but some times she reports lightheadedness and mild dyspnea.  In ED she was noted to be in A-fib with RVR, heart rate occasionally going up to 140s, she received IV metoprolol, p.o. metoprolol, despite that heart rate remains elevated, so [facility] hospitalist consulted to admit.   Assessment/Plan: A-fib with RVR, type unspecified -new diagnosis this admission -Recent 2D echo with a preserved EF, no diastolic dysfunction in May of this year, no indication to repeat now -Currently TSH--1.175 -Pt initially treated with IV cardizem infusion, transitioned to oral cardizem 60 mg TID and continue with increased dose metoprolol 50 mg oral twice daily (patient was on 12.5 mg p.o. twice daily prior to admission).   Cardiology increased metoprolol to 75 mg BID on 9/18 -intolerance to amio and propafenone DCCV on 9/20   -CHADS2VASC score is 2 related to age and female sex, discussed with patient, agreeable to start anticoagulation, pharmacy consulted to dose apixaban   History of PSVT -She is followed with [name], had Zio patch recently, she will continue on metoprolol at a higher dose.   Morbid Obesity Body mass index is 40.77 kg/m Lifestyle modification   Hypothyroidism  -reportedly TSH level was high as an outpatient where her dose has  been gradually decreased from 150 mcg>> 137.5 mcg>> 125 mcg (she did not start receiving this dose yet) current TSH within normal range, will continue at 125 mcg oral daily   Sleep apnea -Continue with nightly CPAP       Family Communication:   daughter updated at bedside 9/20  Consultants:  cardiology  Code Status:  FULL  DVT Prophylaxis:  apixaban   Procedures: As Listed in Progress Note Above  Antibiotics: None      Subjective: Patient denies fevers, chills, headache, chest pain, dyspnea, nausea, vomiting, diarrhea, abdominal pain, dysuria, hematuria, hematochezia, and melena.   Objective: Vitals:   09/14/22 1152 09/14/22 1208 09/14/22 1215 09/14/22 1554  BP: (!) 131/11 105/62 97/68   Pulse: 62 (!) 59 62   Resp: 13 12 16    Temp: 97.7 F (36.5 C)   97.9 F (36.6 C)  TempSrc:    Oral  SpO2: 98% 96% 95%   Weight:      Height:        Intake/Output Summary (Last 24 hours) at 09/14/2022 1726 Last data filed at 09/14/2022 1150 Gross per 24 hour  Intake 250 ml  Output --  Net 250 ml   Weight change:  Exam:  General:  Pt is alert, follows commands appropriately, not in acute distress HEENT: No icterus, No thrush, No neck mass, [place]/AT Cardiovascular: RRR, S1/S2, no rubs, no gallops Respiratory: CTA bilaterally, no wheezing, no crackles, no rhonchi Abdomen: Soft/+BS, non tender, non distended, no guarding Extremities: No edema, No lymphangitis, No petechiae, No rashes, no synovitis   Data Reviewed: I have personally reviewed following labs  and imaging studies Basic Metabolic Panel: Recent Labs  Lab 09/09/22 1547 09/10/22 0435 09/11/22 0344 09/14/22 0246  NA 139 141 139 142  K 4.3 3.9 4.0 4.3  CL 105 109 105 107  CO2 27 27 31 29   GLUCOSE 98 94 93 93  BUN 20 20 19 23   CREATININE 1.00 0.93 0.92 1.03*  CALCIUM 10.0 9.3 9.0 9.7  MG 2.2  --  2.2 2.3   Liver Function Tests: Recent Labs  Lab 09/09/22 1547  AST 25  ALT 32  ALKPHOS 76  BILITOT 0.8   PROT 7.8  ALBUMIN 4.6   No results for input(s): "LIPASE", "AMYLASE" in the last 168 hours. No results for input(s): "AMMONIA" in the last 168 hours. Coagulation Profile: No results for input(s): "INR", "PROTIME" in the last 168 hours. CBC: Recent Labs  Lab 09/09/22 1547 09/10/22 0435  WBC 9.4 9.6  NEUTROABS 5.7  --   HGB 15.4* 14.7  HCT 45.9 44.3  MCV 92.7 91.9  PLT 379 367   Cardiac Enzymes: No results for input(s): "CKTOTAL", "CKMB", "CKMBINDEX", "TROPONINI" in the last 168 hours. BNP: Invalid input(s): "POCBNP" CBG: No results for input(s): "GLUCAP" in the last 168 hours. HbA1C: No results for input(s): "HGBA1C" in the last 72 hours. Urine analysis:    Component Value Date/Time   BILIRUBINUR n 04/06/2016 1304   PROTEINUR n 04/06/2016 1304   UROBILINOGEN negative 04/06/2016 1304   NITRITE n 04/06/2016 1304   LEUKOCYTESUR Negative 04/06/2016 1304   Sepsis Labs: @LABRCNTIP (procalcitonin:4,lacticidven:4) ) Recent Results (from the past 240 hour(s))  MRSA Next Gen by PCR, Nasal     Status: None   Collection Time: 09/10/22  1:25 PM   Specimen: Nasal Mucosa; Nasal Swab  Result Value Ref Range Status   MRSA by PCR Next Gen NOT DETECTED NOT DETECTED Final    Comment: (NOTE) The GeneXpert MRSA Assay (FDA approved for NASAL specimens only), is one component of a comprehensive MRSA colonization surveillance program. It is not intended to diagnose MRSA infection nor to guide or monitor treatment for MRSA infections. Test performance is not FDA approved in patients less than 14 years old. Performed at [facility], [address], [place], [place] [address]      Scheduled Meds:  apixaban  5 mg Oral BID   vitamin C  1,000 mg Oral Daily   B-complex with vitamin C  1 tablet Oral Daily   Chlorhexidine Gluconate Cloth  6 each Topical Y5638   folic acid  2 mg Oral Daily   latanoprost  1 drop Both Eyes QHS   levothyroxine  125 mcg Oral Daily   magnesium oxide  400 mg  Oral BID   metoprolol tartrate  75 mg Oral BID   triamcinolone cream   Topical BID   Vitamin D (Ergocalciferol)  50,000 Units Oral Q Wed-1800   Continuous Infusions:  Procedures/Studies: ECHO TEE  Result Date: 09/14/2022    TRANSESOPHOGEAL ECHO REPORT   Patient Name:   Maria Meza Date of Exam: 09/14/2022 Medical Rec #:  [id number]        Height:       65.0 in Accession #:    [id number]       Weight:       245.8 lb Date of Birth:  1956/04/05         BSA:          2.159 m Patient Age:    18 years  BP:           109/78 mmHg Patient Gender: F                HR:           126 bpm. Exam Location:  [facility] Procedure: Transesophageal Echo, Cardiac Doppler and Color Doppler Indications:    Atrial fibrillation I48.91  History:        Patient has prior history of Echocardiogram examinations, most                 recent 02/14/2022. Arrythmias:PVC, Signs/Symptoms:Shortness of                 Breath; Risk Factors:Non-Smoker. Sleep Apnea, Patient contracted                 CoVid-19 twice.  Sonographer:    Alvino Chapel RCS Referring Phys: [id number] [place]: The transesophogeal probe was passed without difficulty through the esophogus of the patient. Sedation performed by different physician. The patient's vital signs; including heart rate, blood pressure, and oxygen saturation; remained stable throughout the procedure. The patient developed no complications during the procedure. A successful direct current cardioversion was performed at 200 joules with 1 attempt. IMPRESSIONS  1. Left ventricular ejection fraction, by estimation, is 60 to 65%. The left ventricle has normal function. The left ventricle has no regional wall motion abnormalities.  2. Right ventricular systolic function is normal. The right ventricular size is normal. There is normal pulmonary artery systolic pressure. The estimated right ventricular systolic pressure is 55.7 mmHg.  3. Left atrial size was moderately dilated. No  left atrial/left atrial appendage thrombus was detected.  4. The mitral valve is normal in structure. Trivial mitral valve regurgitation. No evidence of mitral stenosis.  5. Tricuspid valve regurgitation is moderate.  6. The aortic valve is tricuspid. Aortic valve regurgitation is not visualized. No aortic stenosis is present.  7. The inferior vena cava is normal in size with greater than 50% respiratory variability, suggesting right atrial pressure of 3 mmHg. Conclusion(s)/Recommendation(s): No LA/LAA thrombus identified. Successful cardioversion performed with restoration of normal sinus rhythm. FINDINGS  Left Ventricle: Left ventricular ejection fraction, by estimation, is 60 to 65%. The left ventricle has normal function. The left ventricle has no regional wall motion abnormalities. The left ventricular internal cavity size was normal in size. There is  no left ventricular hypertrophy. Right Ventricle: The right ventricular size is normal. No increase in right ventricular wall thickness. Right ventricular systolic function is normal. There is normal pulmonary artery systolic pressure. The tricuspid regurgitant velocity is 2.34 m/s, and  with an assumed right atrial pressure of 3 mmHg, the estimated right ventricular systolic pressure is 32.2 mmHg. Left Atrium: Left atrial size was moderately dilated. No left atrial/left atrial appendage thrombus was detected. Right Atrium: Right atrial size was normal in size. Pericardium: There is no evidence of pericardial effusion. Mitral Valve: The mitral valve is normal in structure. Trivial mitral valve regurgitation. No evidence of mitral valve stenosis. Tricuspid Valve: The tricuspid valve is normal in structure. Tricuspid valve regurgitation is moderate . No evidence of tricuspid stenosis. Aortic Valve: The aortic valve is tricuspid. Aortic valve regurgitation is not visualized. No aortic stenosis is present. Pulmonic Valve: The pulmonic valve was normal in structure.  Pulmonic valve regurgitation is trivial. No evidence of pulmonic stenosis. Aorta: The aortic root is normal in size and structure. Venous: The inferior vena cava is normal in size with  greater than 50% respiratory variability, suggesting right atrial pressure of 3 mmHg. IAS/Shunts: No atrial level shunt detected by color flow Doppler.  TRICUSPID VALVE TR Peak grad:   21.9 mmHg TR Vmax:        234.00 cm/s Candee Furbish MD Electronically signed by Candee Furbish MD Signature Date/Time: 09/14/2022/12:57:33 PM    Final    DG Chest 2 View  Result Date: 09/09/2022 CLINICAL DATA:  Chest pain EXAM: CHEST - 2 VIEW COMPARISON:  None Available. FINDINGS: The heart size and mediastinal contours are within normal limits. Both lungs are clear. The visualized skeletal structures are unremarkable. Surgical clips in the right upper quadrant IMPRESSION: No active cardiopulmonary disease. Electronically Signed   By: Marin Roberts M.D.   On: 09/09/2022 16:05    Orson Eva, DO  [facility]  If 7PM-7AM, please contact night-coverage [email or web address] Password [email or web address] 09/14/2022, 5:26 PM   LOS: 4 days

## 2022-09-14 NOTE — Progress Notes (Signed)
*  PRELIMINARY RESULTS* Echocardiogram Echocardiogram Transesophageal has been performed.  Maria Meza 09/14/2022, 12:31 PM

## 2022-09-14 NOTE — Anesthesia Preprocedure Evaluation (Signed)
Anesthesia Evaluation  Patient identified by MRN, date of birth, ID band Patient awake    Reviewed: Allergy & Precautions, H&P , NPO status , Patient's Chart, lab work & pertinent test results, reviewed documented beta blocker date and time   Airway Mallampati: II  TM Distance: >3 FB Neck ROM: full    Dental no notable dental hx.    Pulmonary sleep apnea ,    Pulmonary exam normal breath sounds clear to auscultation       Cardiovascular Exercise Tolerance: Good negative cardio ROS   Rhythm:regular Rate:Normal     Neuro/Psych PSYCHIATRIC DISORDERS Anxiety TIA   GI/Hepatic negative GI ROS, Neg liver ROS,   Endo/Other  Morbid obesity  Renal/GU negative Renal ROS  negative genitourinary   Musculoskeletal   Abdominal   Peds  Hematology  (+) Blood dyscrasia, anemia ,   Anesthesia Other Findings   Reproductive/Obstetrics negative OB ROS                             Anesthesia Physical Anesthesia Plan  ASA: 3  Anesthesia Plan: General   Post-op Pain Management:    Induction:   PONV Risk Score and Plan: Propofol infusion  Airway Management Planned:   Additional Equipment:   Intra-op Plan:   Post-operative Plan:   Informed Consent: I have reviewed the patients History and Physical, chart, labs and discussed the procedure including the risks, benefits and alternatives for the proposed anesthesia with the patient or authorized representative who has indicated his/her understanding and acceptance.     Dental Advisory Given  Plan Discussed with: CRNA  Anesthesia Plan Comments:         Anesthesia Quick Evaluation

## 2022-09-14 NOTE — Progress Notes (Signed)
Patient placed on nasal CPAP of 10 on room air.  Patient tolerating well at this time.  O2 sat 98%

## 2022-09-14 NOTE — Progress Notes (Signed)
Electrical Cardioversion Procedure Note Maria Meza [id number] 1956-10-03  Procedure: Electrical Cardioversion Indications:  Atrial Fibrillation  Procedure Details Consent: Risks of procedure as well as the alternatives and risks of each were explained to the (patient/caregiver).  Consent for procedure obtained. Time Out: Verified patient identification, verified procedure, site/side was marked, verified correct patient position, special equipment/implants available, medications/allergies/relevent history reviewed, required imaging and test results available.  Performed  Patient placed on cardiac monitor, pulse oximetry, supplemental oxygen as necessary.  Sedation given:  propofol Pacer pads placed anterior and posterior chest.  Cardioverted 1 time(s).  Cardioverted at [place].  Evaluation Findings: Post procedure EKG shows: NSR Complications: None Patient did tolerate procedure well.   Kennith Maes 09/14/2022, 12:06 PM

## 2022-09-14 NOTE — Transfer of Care (Signed)
Immediate Anesthesia Transfer of Care Note  Patient: Maria Meza  Procedure(s) Performed: CARDIOVERSION TRANSESOPHAGEAL ECHOCARDIOGRAM (TEE)  Patient Location: PACU  Anesthesia Type:General  Level of Consciousness: awake  Airway & Oxygen Therapy: Patient Spontanous Breathing and Patient connected to nasal cannula oxygen  Post-op Assessment: Report given to RN and Post -op Vital signs reviewed and stable  Post vital signs: Reviewed and stable  Last Vitals:  Vitals Value Taken Time  BP    Temp    Pulse    Resp    SpO2      Last Pain:  Vitals:   09/14/22 0800  TempSrc:   PainSc: 0-No pain         Complications: No notable events documented.

## 2022-09-14 NOTE — CV Procedure (Signed)
   Transesophageal Echocardiogram  Indications: Atrial fibrillation  Time out performed  During this procedure the patient was administered propofol under anesthesiology supervision to achieve and maintain moderate sedation.  The patient's heart rate, blood pressure, and oxygen saturation are monitored continuously during the procedure.   Findings:  Left Ventricle: Normal EF 60%  Mitral Valve: Trivial mitral regurgitation  Aortic Valve: Trileaflet valve no regurgitation  Tricuspid Valve: Moderate tricuspid regurgitation  Left Atrium: Normal, no left atrial appendage thrombus  Right Atrium: Normal.  Prominent Chiari network-fetal remnant benign  Intraatrial septum: Normal  Bubble Contrast Study: Not performed  Candee Furbish, MD     Electrical Cardioversion Procedure Note Maria Meza [id number] 09/01/1956  Procedure: Electrical Cardioversion Indications:  Atrial Fibrillation  Time Out: Verified patient identification, verified procedure,medications/allergies/relevent history reviewed, required imaging and test results available.  Performed  Procedure Details  The patient was NPO after midnight. Anesthesia was administered at the beside  by anesthesia with propofol.  Cardioversion was performed with synchronized biphasic defibrillation via AP pads with 200 joules.  1 attempt(s) were performed.  The patient converted to normal sinus rhythm. The patient tolerated the procedure well   IMPRESSION:  Successful cardioversion of atrial fibrillation    Candee Furbish 09/14/2022, 11:57 AM

## 2022-09-14 NOTE — Progress Notes (Signed)
Rounding Note    Patient Name: Maria Meza Date of Encounter: 09/14/2022  [place] Cardiologist: Glenetta Hew, MD   Subjective   Symptomatic atrial fibrillation.  No chest pain, no shortness of breath  Inpatient Medications    Scheduled Meds:  apixaban  5 mg Oral BID   vitamin C  1,000 mg Oral Daily   B-complex with vitamin C  1 tablet Oral Daily   Chlorhexidine Gluconate Cloth  6 each Topical U3149   folic acid  2 mg Oral Daily   latanoprost  1 drop Both Eyes QHS   levothyroxine  125 mcg Oral Daily   magnesium oxide  400 mg Oral BID   metoprolol tartrate  75 mg Oral BID   Vitamin D (Ergocalciferol)  50,000 Units Oral Q Wed-1800   Continuous Infusions:  PRN Meds: acetaminophen, metoprolol tartrate, ondansetron (ZOFRAN) IV   Vital Signs    Vitals:   09/14/22 0600 09/14/22 0700 09/14/22 0800 09/14/22 0803  BP: 102/71 105/78 98/64   Pulse: 87 89 71 84  Resp: (!) 23 15 11 14   Temp:  98.2 F (36.8 C)    TempSrc:  Oral    SpO2: 95% 96% 97% 96%  Weight:      Height:       No intake or output data in the 24 hours ending 09/14/22 0950    09/12/2022    5:00 AM 09/09/2022    3:30 PM 06/22/2022   10:33 AM  Last 3 Weights  Weight (lbs) 245 lb 13 oz 245 lb 260 lb  Weight (kg) 111.5 kg 111.131 kg 117.935 kg      Telemetry    A-fib RVR- Personally Reviewed  ECG    No new- Personally Reviewed  Physical Exam   GEN: No acute distress.   Neck: No JVD Cardiac: Irregularly irregular tachycardic, no murmurs, rubs, or gallops.  Respiratory: Clear to auscultation bilaterally. GI: Soft, nontender, non-distended  MS: No edema; No deformity. Neuro:  Nonfocal  Psych: Normal affect   Labs    High Sensitivity Troponin:   Recent Labs  Lab 09/09/22 1547 09/09/22 1746  TROPONINIHS 10 10     Chemistry Recent Labs  Lab 09/09/22 1547 09/10/22 0435 09/11/22 0344 09/14/22 0246  NA 139 141 139 142  K 4.3 3.9 4.0 4.3  CL 105 109 105 107  CO2 27  27 31 29   GLUCOSE 98 94 93 93  BUN 20 20 19 23   CREATININE 1.00 0.93 0.92 1.03*  CALCIUM 10.0 9.3 9.0 9.7  MG 2.2  --  2.2 2.3  PROT 7.8  --   --   --   ALBUMIN 4.6  --   --   --   AST 25  --   --   --   ALT 32  --   --   --   ALKPHOS 76  --   --   --   BILITOT 0.8  --   --   --   GFRNONAA >60 >60 >60 60*  ANIONGAP 7 5 3* 6    Lipids No results for input(s): "CHOL", "TRIG", "HDL", "LABVLDL", "LDLCALC", "CHOLHDL" in the last 168 hours.  Hematology Recent Labs  Lab 09/09/22 1547 09/10/22 0435  WBC 9.4 9.6  RBC 4.95 4.82  HGB 15.4* 14.7  HCT 45.9 44.3  MCV 92.7 91.9  MCH 31.1 30.5  MCHC 33.6 33.2  RDW 13.2 13.2  PLT 379 367   Thyroid  Recent Labs  Lab 09/09/22 1547  TSH 1.175  FREET4 0.86    BNPNo results for input(s): "BNP", "PROBNP" in the last 168 hours.  DDimer No results for input(s): "DDIMER" in the last 168 hours.   Radiology    No results found.  Cardiac Studies   Echocardiogram February 2023-EF 65%.  Left atrium moderately dilated.  Patient Profile     66 y.o. female with A-fib RVR  Assessment & Plan    A-fib RVR - TEE cardioversion.  Benefits explained.  Willing to proceed.  Daughter in room as well. -Difficulty controlling heart rate.  On metoprolol tartrate 75 mg twice daily.  Has been on Eliquis since Friday. -She states in the past she has seen EP at [facility]. -Discussed with her the possibility of ablation if atrial fibrillation returns.  Chronic anticoagulation - Eliquis.  Understands risks.  Hemoglobin 15.4.  PSVT - [name].  Her husband Ray also follows with [name].  Had atrial fibrillation with cardioversion. -Recently had ZIO.  Morbid obesity - Risk factor for A-fib continue to encourage weight loss.  Obstructive sleep apnea - Risk factor for A-fib, wear CPAP.  Contact dermatitis - Patches noted from prior ECG telemetry stickers.     For questions or updates, please contact [place] Please consult  [email or web address] for contact info under        Signed, Candee Furbish, MD  09/14/2022, 9:50 AM

## 2022-09-15 DIAGNOSIS — I4891 Unspecified atrial fibrillation: Secondary | ICD-10-CM | POA: Diagnosis not present

## 2022-09-15 DIAGNOSIS — Z9989 Dependence on other enabling machines and devices: Secondary | ICD-10-CM | POA: Diagnosis not present

## 2022-09-15 DIAGNOSIS — G4733 Obstructive sleep apnea (adult) (pediatric): Secondary | ICD-10-CM | POA: Diagnosis not present

## 2022-09-15 LAB — BASIC METABOLIC PANEL
Anion gap: 4 — ABNORMAL LOW (ref 5–15)
BUN: 26 mg/dL — ABNORMAL HIGH (ref 8–23)
CO2: 29 mmol/L (ref 22–32)
Calcium: 9.1 mg/dL (ref 8.9–10.3)
Chloride: 107 mmol/L (ref 98–111)
Creatinine, Ser: 0.96 mg/dL (ref 0.44–1.00)
GFR, Estimated: >60 mL/min (ref >60)
Glucose, Bld: 90 mg/dL (ref 70–99)
Potassium: 4 mmol/L (ref 3.5–5.1)
Sodium: 140 mmol/L (ref 135–145)

## 2022-09-15 LAB — MAGNESIUM: Magnesium: 2.3 mg/dL (ref 1.7–2.4)

## 2022-09-15 MED ORDER — APIXABAN 5 MG PO TABS: 5.0000 mg | ORAL_TABLET | Freq: Two times a day (BID) | ORAL | 1 refills | Status: DC

## 2022-09-15 MED ORDER — LEVOTHYROXINE SODIUM 125 MCG PO TABS: 125.0000 ug | ORAL_TABLET | Freq: Every day | ORAL | 1 refills | Status: DC

## 2022-09-15 MED ORDER — METOPROLOL TARTRATE 75 MG PO TABS: 75.0000 mg | ORAL_TABLET | Freq: Two times a day (BID) | ORAL | 1 refills | Status: DC

## 2022-09-15 MED ORDER — METOPROLOL TARTRATE 75 MG PO TABS: 75.0000 mg | ORAL_TABLET | Freq: Two times a day (BID) | ORAL | Status: DC

## 2022-09-15 NOTE — Anesthesia Postprocedure Evaluation (Signed)
Anesthesia Post Note  Patient: Maria Meza  Procedure(s) Performed: CARDIOVERSION TRANSESOPHAGEAL ECHOCARDIOGRAM (TEE)  Patient location during evaluation: Phase II Anesthesia Type: General Level of consciousness: awake Pain management: pain level controlled Vital Signs Assessment: post-procedure vital signs reviewed and stable Respiratory status: spontaneous breathing and respiratory function stable Cardiovascular status: blood pressure returned to baseline and stable Postop Assessment: no headache and no apparent nausea or vomiting Anesthetic complications: no Comments: Late entry   No notable events documented.   Last Vitals:  Vitals:   09/15/22 0100 09/15/22 0700  BP: (!) 102/46   Pulse: (!) 54   Resp: 17   Temp:  36.8 C  SpO2: 94%     Last Pain:  Vitals:   09/15/22 0700  TempSrc: Oral  PainSc:                  Louann Sjogren

## 2022-09-15 NOTE — Discharge Summary (Signed)
Physician Discharge Summary   Patient: Maria Meza MRN: [id number] DOB: [birth year]  Admit date:     09/09/2022  Discharge date: 09/15/22  Discharge Physician: Shanon Brow Fatou Dunnigan   PCP: Celene Squibb, MD   Recommendations at discharge:   Please follow up with primary care provider within 1-2 weeks  Please repeat BMP and CBC in one week     Hospital Course: 66 y.o. female, with past medical history of hypothyroidism (dose recently adjusted), unspecified connective tissue disorder (she is not taking Plaquenil anymore), history of palpitation, SVTs, obesity, sleep apnea, she presents to ED secondary to complaints of palpitation, patient follows with her cardiologist [name] regarding that, where she had Zio patch, she is known to have history of SVT for which she is taking dose metoprolol, patient with hypothyroidism her Synthroid dose has been adjusted recently, she denies chest pain, but some times she reports lightheadedness and mild dyspnea.  In ED she was noted to be in A-fib with RVR, heart rate occasionally going up to 140s, she received IV metoprolol, p.o. metoprolol, despite that heart rate remains elevated, so [facility] hospitalist consulted to admit. Patient was started on IV diltiazem and po metoprolol dose was titrated up.  Cardiology was consulted as her rates remained poorly controlled and BP was soft.  She underwent DCCV cardioversion on 9/20 and converted to sinus.  The patient maintained sinus rhythm after cardioversion.  She was cleared for discharge home on 09/15/2022 by cardiology to continue metoprolol 75 mg twice daily.  Assessment and Plan: A-fib with RVR, type unspecified -new diagnosis this admission -Recent 2D echo with a preserved EF, no diastolic dysfunction in May of this year, no indication to repeat now -Currently TSH--1.175 -Pt initially treated with IV cardizem infusion, transitioned to oral cardizem 60 mg TID and continue with increased dose metoprolol 50 mg oral twice  daily (patient was on 12.5 mg p.o. twice daily prior to admission).   Cardiology increased metoprolol to 75 mg BID on 9/18 -intolerance to amio and propafenone DCCV on 9/20 -She remained in sinus rhythm after cardioversion.  Electrolytes were optimized. 09/15/2022--cleared for discharge by cardiology,   -CHADS2VASC score is 2 related to age and female sex, discussed with patient, agreeable to start anticoagulation   History of PSVT -She is followed with [name], had Zio patch recently, she will continue on metoprolol at a higher dose.   Morbid Obesity Body mass index is 40.77 kg/m Lifestyle modification   Hypothyroidism  -reportedly TSH level was high as an outpatient where her dose has been gradually decreased from 150 mcg>> 137.5 mcg>> 125 mcg (she did not start receiving this dose yet) current TSH within normal range, will continue at 125 mcg oral daily   Sleep apnea -Continue with nightly CPAP            Consultants: cardiology Procedures performed: DCCV 9/20  Disposition: Home Diet recommendation:  Cardiac diet DISCHARGE MEDICATION: Allergies as of 09/15/2022       Reactions   Latex Other (See Comments)   blisters   Gluten Meal    Pt has celiac disease.    Hydrocodone Nausea And Vomiting   Pacerone [amiodarone] Other (See Comments)   "does not tolerate"--slows heart rate   Propafenone Hcl    Caused very low HR   Verapamil    Fatigue, constipation        Medication List     TAKE these medications    apixaban 5 MG Tabs tablet Commonly known  asArne [place] Take 1 tablet (5 mg total) by mouth 2 (two) times daily.   b complex vitamins tablet Take by mouth daily.   folic acid 1 MG tablet Commonly known as: FOLVITE Take 2 mg by mouth daily. 2 tablets daily   latanoprost 0.005 % ophthalmic solution Commonly known as: XALATAN Place 1 drop into both eyes daily.   levothyroxine 125 MCG tablet Commonly known as: SYNTHROID Take 1 tablet (125 mcg  total) by mouth daily. Start taking on: September 16, 2022 What changed:  medication strength how much to take Another medication with the same name was removed. Continue taking this medication, and follow the directions you see here.   magnesium oxide 400 MG tablet Commonly known as: MAG-OX Take 1 tablet (400 mg total) by mouth 2 (two) times daily.   Metoprolol Tartrate 75 MG Tabs Take 75 mg by mouth 2 (two) times daily. What changed:  medication strength how much to take additional instructions   mometasone 0.1 % lotion Commonly known as: ELOCON Apply 1 application topically.   vitamin C 1000 MG tablet Take 1,000 mg by mouth daily.   Vitamin D (Ergocalciferol) 1.25 MG (50000 UNIT) Caps capsule Commonly known as: DRISDOL Take 50,000 Units by mouth once a week.        Discharge Exam: Filed Weights   09/09/22 1530 09/12/22 0500  Weight: 111.1 kg 111.5 kg   HEENT:  [place]/AT, No thrush, no icterus CV:  RRR, no rub, no S3, no S4 Lung:  CTA, no wheeze, no rhonchi Abd:  soft/+BS, NT Ext:  No edema, no lymphangitis, no synovitis, no rash   Condition at discharge: stable  The results of significant diagnostics from this hospitalization (including imaging, microbiology, ancillary and laboratory) are listed below for reference.   Imaging Studies: ECHO TEE  Result Date: 09/14/2022    TRANSESOPHOGEAL ECHO REPORT   Patient Name:   Maria Meza Date of Exam: 09/14/2022 Medical Rec #:  [id number]        Height:       65.0 in Accession #:    [id number]       Weight:       245.8 lb Date of Birth:  Nov 01, 1956         BSA:          2.159 m Patient Age:    68 years         BP:           109/78 mmHg Patient Gender: F                HR:           126 bpm. Exam Location:  [facility] Procedure: Transesophageal Echo, Cardiac Doppler and Color Doppler Indications:    Atrial fibrillation I48.91  History:        Patient has prior history of Echocardiogram examinations, most                  recent 02/14/2022. Arrythmias:PVC, Signs/Symptoms:Shortness of                 Breath; Risk Factors:Non-Smoker. Sleep Apnea, Patient contracted                 CoVid-19 twice.  Sonographer:    Alvino Chapel RCS Referring Phys: [id number] [place]: The transesophogeal probe was passed without difficulty through the esophogus of the patient. Sedation performed by different physician. The patient's vital signs; including heart rate, blood  pressure, and oxygen saturation; remained stable throughout the procedure. The patient developed no complications during the procedure. A successful direct current cardioversion was performed at 200 joules with 1 attempt. IMPRESSIONS  1. Left ventricular ejection fraction, by estimation, is 60 to 65%. The left ventricle has normal function. The left ventricle has no regional wall motion abnormalities.  2. Right ventricular systolic function is normal. The right ventricular size is normal. There is normal pulmonary artery systolic pressure. The estimated right ventricular systolic pressure is 93.7 mmHg.  3. Left atrial size was moderately dilated. No left atrial/left atrial appendage thrombus was detected.  4. The mitral valve is normal in structure. Trivial mitral valve regurgitation. No evidence of mitral stenosis.  5. Tricuspid valve regurgitation is moderate.  6. The aortic valve is tricuspid. Aortic valve regurgitation is not visualized. No aortic stenosis is present.  7. The inferior vena cava is normal in size with greater than 50% respiratory variability, suggesting right atrial pressure of 3 mmHg. Conclusion(s)/Recommendation(s): No LA/LAA thrombus identified. Successful cardioversion performed with restoration of normal sinus rhythm. FINDINGS  Left Ventricle: Left ventricular ejection fraction, by estimation, is 60 to 65%. The left ventricle has normal function. The left ventricle has no regional wall motion abnormalities. The left ventricular internal cavity  size was normal in size. There is  no left ventricular hypertrophy. Right Ventricle: The right ventricular size is normal. No increase in right ventricular wall thickness. Right ventricular systolic function is normal. There is normal pulmonary artery systolic pressure. The tricuspid regurgitant velocity is 2.34 m/s, and  with an assumed right atrial pressure of 3 mmHg, the estimated right ventricular systolic pressure is 16.9 mmHg. Left Atrium: Left atrial size was moderately dilated. No left atrial/left atrial appendage thrombus was detected. Right Atrium: Right atrial size was normal in size. Pericardium: There is no evidence of pericardial effusion. Mitral Valve: The mitral valve is normal in structure. Trivial mitral valve regurgitation. No evidence of mitral valve stenosis. Tricuspid Valve: The tricuspid valve is normal in structure. Tricuspid valve regurgitation is moderate . No evidence of tricuspid stenosis. Aortic Valve: The aortic valve is tricuspid. Aortic valve regurgitation is not visualized. No aortic stenosis is present. Pulmonic Valve: The pulmonic valve was normal in structure. Pulmonic valve regurgitation is trivial. No evidence of pulmonic stenosis. Aorta: The aortic root is normal in size and structure. Venous: The inferior vena cava is normal in size with greater than 50% respiratory variability, suggesting right atrial pressure of 3 mmHg. IAS/Shunts: No atrial level shunt detected by color flow Doppler.  TRICUSPID VALVE TR Peak grad:   21.9 mmHg TR Vmax:        234.00 cm/s Candee Furbish MD Electronically signed by Candee Furbish MD Signature Date/Time: 09/14/2022/12:57:33 PM    Final    DG Chest 2 View  Result Date: 09/09/2022 CLINICAL DATA:  Chest pain EXAM: CHEST - 2 VIEW COMPARISON:  None Available. FINDINGS: The heart size and mediastinal contours are within normal limits. Both lungs are clear. The visualized skeletal structures are unremarkable. Surgical clips in the right upper quadrant  IMPRESSION: No active cardiopulmonary disease. Electronically Signed   By: Marin Roberts M.D.   On: 09/09/2022 16:05    Microbiology: Results for orders placed or performed during the hospital encounter of 09/09/22  MRSA Next Gen by PCR, Nasal     Status: None   Collection Time: 09/10/22  1:25 PM   Specimen: Nasal Mucosa; Nasal Swab  Result Value Ref Range Status  MRSA by PCR Next Gen NOT DETECTED NOT DETECTED Final    Comment: (NOTE) The GeneXpert MRSA Assay (FDA approved for NASAL specimens only), is one component of a comprehensive MRSA colonization surveillance program. It is not intended to diagnose MRSA infection nor to guide or monitor treatment for MRSA infections. Test performance is not FDA approved in patients less than 52 years old. Performed at [facility], [address] [place], [place] [address]     Labs: CBC: Recent Labs  Lab 09/09/22 1547 09/10/22 0435  WBC 9.4 9.6  NEUTROABS 5.7  --   HGB 15.4* 14.7  HCT 45.9 44.3  MCV 92.7 91.9  PLT 379 425   Basic Metabolic Panel: Recent Labs  Lab 09/09/22 1547 09/10/22 0435 09/11/22 0344 09/14/22 0246 09/15/22 0617  NA 139 141 139 142 140  K 4.3 3.9 4.0 4.3 4.0  CL 105 109 105 107 107  CO2 27 27 31 29 29   GLUCOSE 98 94 93 93 90  BUN 20 20 19 23  26*  CREATININE 1.00 0.93 0.92 1.03* 0.96  CALCIUM 10.0 9.3 9.0 9.7 9.1  MG 2.2  --  2.2 2.3 2.3   Liver Function Tests: Recent Labs  Lab 09/09/22 1547  AST 25  ALT 32  ALKPHOS 76  BILITOT 0.8  PROT 7.8  ALBUMIN 4.6   CBG: No results for input(s): "GLUCAP" in the last 168 hours.  Discharge time spent: greater than 30 minutes.  Signed: Orson Eva, MD [facility] 09/15/2022

## 2022-09-19 ENCOUNTER — Encounter: Payer: Self-pay | Admitting: Cardiology

## 2022-09-22 ENCOUNTER — Telehealth: Payer: Self-pay | Admitting: Cardiology

## 2022-09-22 MED ORDER — METOPROLOL TARTRATE 50 MG PO TABS: 50.0000 mg | ORAL_TABLET | Freq: Two times a day (BID) | ORAL | 0 refills | Status: DC

## 2022-09-22 NOTE — Telephone Encounter (Signed)
Patient called in to triage to report P 49, dizziness, and SOB. She was recently d/c from hospital, where her metoprolol tartrate was increased to 37m twice daily for EKG of afib at 109 bpm. She was already scheduled with JThomasene Mohairfor 10/3 and [place] something sooner [place] Spoke with Dr. KClaiborne Billings(DOD). He ordered metoprolol tartrate 527mtwice daily. Patient advised of dose change. She read a metoprolol medication bottle to me as  2514mablets that she had (date still good). She will take 2 of those twice daily and keep a diary of her BP. Medication list updated.

## 2022-09-22 NOTE — Telephone Encounter (Signed)
STAT if HR is under 50 or over 120 (normal HR is 60-100 beats per minute)  What is your heart rate? 49  Do you have a log of your heart rate readings (document readings)? No  Do you have any other symptoms? Dizziness and SOB   Pt states that medication dosage was changed while in hospital and she thinks that may be the cause of HR being low.

## 2022-09-23 ENCOUNTER — Encounter (HOSPITAL_COMMUNITY): Payer: Self-pay | Admitting: Cardiology

## 2022-09-25 NOTE — Progress Notes (Signed)
Cardiology Clinic Note   Patient Name: Maria Meza Date of Encounter: 09/27/2022  Primary Care Provider:  Benita Stabile, MD Primary Cardiologist:  Bryan Lemma, MD  Patient Profile    Maria Meza 66 year old female presents to the clinic today for follow-up evaluation of her atrial fibrillation.  Past Medical History    Past Medical History:  Diagnosis Date   Absolute anemia    No longer on iron supplementation.   Adult celiac disease 2018   Anxiety    Arrhythmia 06/2016   Event monitor has shown PVCs, PSVT at 1 short burst of NSVT.;  Event monitor from March 2021: 6 short runs of wide-complex tachycardia and 25 runs of PSVT--intolerant of high-dose beta-blocker because of bradycardia.   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  (ANA positive, dsDNA negative)   Dysmenorrhea    Glaucoma    History of COVID-19 02/19/2020   IBS (irritable bowel syndrome)    Morbid obesity with BMI of 40.0-44.9, adult (HCC) 09/2020   BMI 43.07   MRSA infection 4-end-21   on abdomen   OSA on CPAP    Thyroid disease    hypothyroidism   TIA (transient ischemic attack)    2 per patient   Urinary incontinence    with sneezing, coughing   Past Surgical History:  Procedure Laterality Date   CARDIAC EVENT MONITOR  06/2016   [facility]) Noted PVCs, PSVT and NSVT (1 episode of 20 beats). ->  PT evaluation suggested PVCs appear to have been completely interpolated.  Felt to be septal   CARDIAC MRI -ADENOSINE / STRESS  05/28/2020   [facility]): EF 69%. Normal LV size, thickness and function w/ NO RWMA  CO & CI 6.5 L/min & 3 L/min.      Normal RV size thickness and function.  No RV WMA or aneurysm.  No findings c/w  ARVC.  Both atria are mildly enlarged.  Trileaflet aortic valve with no evidence of stenosis.  Adenosine Stress  Imaging: no  inducible myocardial ischemia OR prior infarction/scar or infiltrate. Normal AoV. Mid TR.   CARDIOVERSION N/A 09/14/2022   Procedure: CARDIOVERSION;  Surgeon: Jake Bathe, MD;  Location: [facility];  Service: Cardiovascular;  Laterality: N/A;   CATARACT EXTRACTION Right 03/2018   CATARACT EXTRACTION Left 03/2018   CHOLECYSTECTOMY     LAPAROSCOPIC TUBAL LIGATION  1986   NUCLEAR STRESS TEST  05/2014   EF 55%.  Mixed anteroseptal defect consistent with breast attenuation artifact.   TEE WITHOUT CARDIOVERSION N/A 09/14/2022   Procedure: TRANSESOPHAGEAL ECHOCARDIOGRAM (TEE);  Surgeon: Jake Bathe, MD;  Location: [facility];  Service: Cardiovascular;  Laterality: N/A;   TRANSTHORACIC ECHOCARDIOGRAM  08/2016   [facility]- Normal LV size and function-EF~55%.  No RWMA..  Normal LA pressures.  Normal RV function.  Trivial AR, PR and TR.  No stenoses.    TRANSTHORACIC ECHOCARDIOGRAM  02/14/2022   Difficult images.  Normal EF 60 to 65%.  No RWMA.  Normal diastolic parameters.  Normal RV size and function.  Mild to moderate LA dilation.  Normal aortic and mitral valves.  Acing aorta measured roughly 38 mm.   ZIO PATCH CARDIAC EVENT MONITOR  02/2020   [facility] Rhytym: SR - min 46 - max 113 bpm, avg 64 bpm. Rare isolated PVC & PACs. 6 short NSVT  runs (8-11 beats, 134-245 bpm); 25 runs of  PAT/SVT - fastest 9 beats (200 bpm), longest 10.6 sec (130 bpm). Noted Sx w/ PAT & NSVT during daylight hours, and only once at midnight.   ZIO PATCH MONITOR  01/2022   Predominant SR: Rate range 46-99 bpm, avg 61 bpm.  Rare PACs & PVCs (& rare couplets).  No bigeminy/trigeminy.  (Sx noted w/ PACs and PVCs - & a few Atrial Runs).  35 Atrial Runs + 1 "V. tach "run of 4 Narrow Complex beats; fastest atrial run 5 beats @ 193 bpm & longest 22 beats (12.1 sec) @ avg 108 bpm.    Allergies  Allergies  Allergen Reactions   Latex Other (See Comments)    blisters   Gluten Meal     Pt has celiac disease.    Hydrocodone Nausea  And Vomiting   Pacerone [Amiodarone] Other (See Comments)    "does not tolerate"--slows heart rate   Propafenone Hcl     Caused very low HR   Verapamil     Fatigue, constipation    History of Present Illness    Maria Meza has a PMH of atrial fibrillation, chronic anticoagulation, PSVT, OSA on CPAP, contact dermatitis, and morbid obesity.  Her echocardiogram 2/23 showed an LVEF of 65% with moderately dilated left atrium.  She was admitted to the hospital on 09/10/2022 and discharged on 09/15/2022.  Benefits and risks of TEE cardioversion were discussed.  She agreed to proceed with the procedure.  Her daughter was present for the explanation.  She reported compliance with her metoprolol tartrate 75 mg twice daily.  She had been on apixaban since Friday before.  She reported that previously she had been followed by [facility].  She underwent successful DCCV on 09/14/2022.  She received 1 shock with 200 J.  She presents to the clinic today for follow-up evaluation states she has noticed since leaving the hospital that she is intermittent periods of dizziness and occasional palpitations.  She reports that her dizziness will last for 1 to 2 minutes when she changes positions.  She also has noticed that while she is in a reclined and seated position.  She notices palpitations that last from 1 to 2 minutes and resolve with coughing, drinking cold water, and splashing cold water in her face.  She has a history of being very sensitive to medication.  We discussed the possibility of adding diltiazem to her medication regimen.  We will defer at this time and reduce her metoprolol to 25 mg during the day 50 mg in the afternoon.  I have encouraged her to continue to use with her CPAP, avoid triggers for palpitations.  We will plan follow-up in 2 to 3 months.  Today she denies chest pain, shortness of breath, lower extremity edema, melena, hematuria, hemoptysis, diaphoresis, weakness, presyncope, syncope,  orthopnea, and PND.   Home Medications    Prior to Admission medications   Medication Sig Start Date End Date Taking? Authorizing Provider  apixaban (ELIQUIS) 5 MG TABS tablet Take 1 tablet (5 mg total) by mouth 2 (two) times daily. 09/15/22   Catarina Hartshorn, MD  Ascorbic Acid (VITAMIN C) 1000 MG tablet Take 1,000 mg by mouth daily.    [provider]  b complex vitamins tablet Take by mouth daily.    [provider]  folic acid (FOLVITE) 1 MG tablet Take 2 mg by mouth daily. 2 tablets daily 04/21/11   [provider]  latanoprost Harrel Lemon)  0.005 % ophthalmic solution Place 1 drop into both eyes daily.     [provider]  levothyroxine (SYNTHROID) 125 MCG tablet Take 1 tablet (125 mcg total) by mouth daily. 09/16/22   Catarina Hartshorn, MD  magnesium oxide (MAG-OX) 400 MG tablet Take 1 tablet (400 mg total) by mouth 2 (two) times daily. 09/06/21   Marykay Lex, MD  metoprolol tartrate (LOPRESSOR) 50 MG tablet Take 1 tablet (50 mg total) by mouth 2 (two) times daily. 09/22/22   Lennette Bihari, MD  mometasone (ELOCON) 0.1 % lotion Apply 1 application topically.    [provider]  Vitamin D, Ergocalciferol, (DRISDOL) 1.25 MG (50000 UNIT) CAPS capsule Take 50,000 Units by mouth once a week. 04/17/21   [provider]    Family History    Family History  Problem Relation Age of Onset   Heart attack Father        dec age 45   Hypertension Father    Hypertension Mother    Hyperlipidemia Mother    Migraines Mother    Seizures Mother        epilepsy   Stroke Mother        TIA's   Thyroid disease Sister        hypothyroid   Thyroid disease Sister        hypothyroid   Rheum arthritis Maternal Grandmother    Migraines Maternal Grandmother    Cancer Maternal Grandfather 61       colon ca--DEC age 6   Diabetes Maternal Grandfather    Stroke Paternal Grandmother        multiple   Thyroid disease Paternal Grandmother        goiter-hypothyroid    Breast cancer Maternal Aunt    She indicated that her mother is alive. She indicated that her father is deceased. She indicated that both of her sisters are alive. She indicated that her brother is alive. She indicated that the status of her maternal grandmother is unknown. She indicated that the status of her maternal grandfather is unknown. She indicated that the status of her paternal grandmother is unknown. She indicated that the status of her maternal aunt is unknown.  Social History    Social History   Socioeconomic History   Marital status: Married    Spouse name: Not on file   Number of children: Not on file   Years of education: Not on file   Highest education level: Not on file  Occupational History   Not on file  Tobacco Use   Smoking status: Never   Smokeless tobacco: Never  Vaping Use   Vaping Use: Never used  Substance and Sexual Activity   Alcohol use: No    Alcohol/week: 0.0 standard drinks of alcohol   Drug use: No   Sexual activity: Not Currently    Partners: Male    Birth control/protection: Post-menopausal  Other Topics Concern   Not on file  Social History Narrative   She lives in [place], [place]   Right handed    Caffeine use: 1 cup coffee every morning   Social Determinants of Health   Financial Resource Strain: Not on file  Food Insecurity: No Food Insecurity (09/10/2022)   Hunger Vital Sign    Worried About Running Out of Food in the Last Year: Never true    Ran Out of Food in the Last Year: Never true  Transportation Needs: No Transportation Needs (09/10/2022)   PRAPARE - Transportation  Lack of Transportation (Medical): No    Lack of Transportation (Non-Medical): No  Physical Activity: Not on file  Stress: Not on file  Social Connections: Not on file  Intimate Partner Violence: Not At Risk (09/10/2022)   Humiliation, Afraid, Rape, and Kick questionnaire    Fear of Current or Ex-Partner: No    Emotionally Abused: No    Physically  Abused: No    Sexually Abused: No     Review of Systems    General:  No chills, fever, night sweats or weight changes.  Cardiovascular:  No chest pain, dyspnea on exertion, edema, orthopnea, palpitations, paroxysmal nocturnal dyspnea. Dermatological: No rash, lesions/masses Respiratory: No cough, dyspnea Urologic: No hematuria, dysuria Abdominal:   No nausea, vomiting, diarrhea, bright red blood per rectum, melena, or hematemesis Neurologic:  No visual changes, wkns, changes in mental status. All other systems reviewed and are otherwise negative except as noted above.  Physical Exam    VS:  BP 126/64   Pulse (!) 44   Wt 248 lb 9.6 oz (112.8 kg)   LMP 04/16/2016 (Exact Date)   SpO2 94%   BMI 41.37 kg/m  , BMI Body mass index is 41.37 kg/m. GEN: Well nourished, well developed, in no acute distress. HEENT: normal. Neck: Supple, no JVD, carotid bruits, or masses. Cardiac: RRR, no murmurs, rubs, or gallops. No clubbing, cyanosis, edema.  Radials/DP/PT 2+ and equal bilaterally.  Respiratory:  Respirations regular and unlabored, clear to auscultation bilaterally. GI: Soft, nontender, nondistended, BS + x 4. MS: no deformity or atrophy. Skin: warm and dry, no rash. Neuro:  Strength and sensation are intact. Psych: Normal affect.  Accessory Clinical Findings    Recent Labs: 09/09/2022: ALT 32; TSH 1.175 09/10/2022: Hemoglobin 14.7; Platelets 367 09/15/2022: BUN 26; Creatinine, Ser 0.96; Magnesium 2.3; Potassium 4.0; Sodium 140   Recent Lipid Panel No results found for: "CHOL", "TRIG", "HDL", "CHOLHDL", "VLDL", "LDLCALC", "LDLDIRECT"       ECG personally reviewed by me today-sinus bradycardia incomplete right bundle branch block 44 bpm- No acute changes  Cardiac event monitor 02/16/2022  14-day Zio patch monitor worn for 12 days, 19 hours. February 2023 Predominant rhythm sinus rhythm: Minimum heart rate 46 bpm, maximum heart rate 99 bpm with an average of 61 bpm. Rare PACs  and PVCs with rare couplets and only PAC triplets. No bigeminy or trigeminy.--These were what was noted on patient diary. 1 4 beat run of what looks like a narrow complex tachycardia read by the machine as ventricular tachycardia. Max rate 148 bpm. 35 atrial runs: Fastest interval was 9 beats at a rate of 102 bpm. Longest was 22 beats lasting 12.1 seconds at an average rate of 108 bpm.     Patch Wear Time:  12 days and 19 hours (2023-02-01T16:46:46-0500 to 2023-02-14T12:03:33-0500)   For the most part, symptoms are noted with PACs or PVCs in sinus rhythm, 3-4 atrial runs were also noted.   Bryan Lemma, MD    Assessment & Plan   1.  Atrial fibrillation-EKG today shows sinus bradycardia 44 bpm incomplete right bundle branch block.  Underwent successful DCCV on 09/14/2022.  Reports compliance with apixaban and denies bleeding issues.  Notices brief intermittent episodes of palpitations that last for 1 to 2 minutes.  Go away with coughing, splashing cold water in her face or with drinking cold water.  She is noticing dizziness with increased metoprolol dosing.   Decrease metoprolol to 25 mg in a.m. and continue 50 mg in the p.m.  Continue apixaban Heart healthy low-sodium diet Increase physical activity as tolerated Increase p.o. hydration Avoid triggers Discussed possibility of possibly using a diltiazem if palpitations continue.  Patient is very sensitive to medication and we will defer diltiazem dosing at this time.  PSVT-denies palpitations.  Cardiac event monitor 02/16/2022 showed sinus rhythm, PACs, PVCs, and 3-4 atrial runs. Continue metoprolol Avoid triggers caffeine, chocolate, EtOH, dehydration etc.  OSA-compliant with CPAP.  Waking up well rested. Continue weight loss Continue CPAP use  Morbid obesity-weight today 248.6 lbs.  Working on increasing physical activity and heart healthy diet. Continue weight loss  Disposition: Follow-up with Dr. Herbie Baltimore in 2-3 months.   Thomasene Ripple. Virgle Arth NP-C     09/27/2022, 11:28 AM [facility] [address] Office [phone] Fax [phone]  Notice: This dictation was prepared with Dragon dictation along with smaller phrase technology. Any transcriptional errors that result from this process are unintentional and may not be corrected upon review.  I spent 14 minutes examining this patient, reviewing medications, and using patient centered shared decision making involving her cardiac care.  Prior to her visit I spent greater than 20 minutes reviewing her past medical history,  medications, and prior cardiac tests.

## 2022-09-27 ENCOUNTER — Ambulatory Visit: Payer: Medicare Other | Attending: General Practice | Admitting: General Practice

## 2022-09-27 ENCOUNTER — Encounter: Payer: Self-pay | Admitting: General Practice

## 2022-09-27 VITALS — BP 126/64 | HR 44 | Wt 248.6 lb

## 2022-09-27 DIAGNOSIS — G4733 Obstructive sleep apnea (adult) (pediatric): Secondary | ICD-10-CM | POA: Insufficient documentation

## 2022-09-27 DIAGNOSIS — I471 Supraventricular tachycardia, unspecified: Secondary | ICD-10-CM | POA: Diagnosis not present

## 2022-09-27 DIAGNOSIS — I4891 Unspecified atrial fibrillation: Secondary | ICD-10-CM | POA: Diagnosis not present

## 2022-09-27 MED ORDER — METOPROLOL TARTRATE 50 MG PO TABS: ORAL_TABLET | ORAL | 1 refills | Status: DC

## 2022-09-27 NOTE — Patient Instructions (Signed)
Medication Instructions:  DECREASE METOPROLOL 25 IN THE AM AND 50MG IN THE PM  *If you need a refill on your cardiac medications before your next appointment, please call your pharmacy*   Lab Work: NONE  Other Instructions PLEASE INCREASE PHYSICAL ACTIVITY AS TOLERATED  CONTINUE WEIGHT LOSS  Follow-Up: At [facility], you and your health needs are our priority.  As part of our continuing mission to provide you with exceptional heart care, we have created designated Provider Care Teams.  These Care Teams include your primary Cardiologist (physician) and Advanced Practice Providers (APPs -  Physician Assistants and Nurse Practitioners) who all work together to provide you with the care you need, when you need it.  Your next appointment:   2-3 month(s)  The format for your next appointment:   In Person  Provider:   Glenetta Hew, MD     Important Information About Sugar

## 2022-09-30 ENCOUNTER — Ambulatory Visit
Admission: RE | Admit: 2022-09-30 | Discharge: 2022-09-30 | Disposition: A | Discharge status: Home / Self Care | Payer: Medicare Other | Source: Ambulatory Visit | Attending: Obstetrics and Gynecology | Admitting: Obstetrics and Gynecology

## 2022-09-30 DIAGNOSIS — Z1231 Encounter for screening mammogram for malignant neoplasm of breast: Secondary | ICD-10-CM

## 2022-10-01 ENCOUNTER — Other Ambulatory Visit: Payer: Self-pay | Admitting: Cardiology

## 2022-10-06 ENCOUNTER — Encounter (INDEPENDENT_AMBULATORY_CARE_PROVIDER_SITE_OTHER): Payer: Medicare Other

## 2022-10-06 DIAGNOSIS — I480 Paroxysmal atrial fibrillation: Secondary | ICD-10-CM | POA: Diagnosis not present

## 2022-10-06 NOTE — Telephone Encounter (Addendum)
Please let Ms. Copen know that I have reviewed her blood pressures and heart rates.  We will decrease her metoprolol to 25 mg in the evening as well.  Please ask order losartan 25 mg to be given daily in the p.m.  Please ask her to continue to monitor her blood pressures and heart rate.  Thank you.  Jossie Ng. Qusay Villada NP-C     02/07/2023, 11:51 AM [place] [address] Office [phone] Fax [phone]  Prior to this documentation I spent greater than 5 minutes reviewing the patient's past medical history and medications.

## 2022-10-07 MED ORDER — METOPROLOL TARTRATE 50 MG PO TABS: ORAL_TABLET | ORAL | 1 refills | Status: DC

## 2022-10-07 MED ORDER — METOPROLOL TARTRATE 25 MG PO TABS: 25.0000 mg | ORAL_TABLET | Freq: Two times a day (BID) | ORAL | 3 refills | Status: DC

## 2022-10-07 MED ORDER — LOSARTAN POTASSIUM 25 MG PO TABS: 25.0000 mg | ORAL_TABLET | Freq: Every day | ORAL | 3 refills | Status: DC

## 2022-10-07 NOTE — Addendum Note (Signed)
Addended by: Fidel Levy on: 10/07/2022 08:02 AM   Modules accepted: Orders

## 2022-10-07 NOTE — Addendum Note (Signed)
Addended by: Waylan Rocher on: 10/07/2022 01:33 PM   Modules accepted: Orders

## 2022-10-07 NOTE — Addendum Note (Signed)
Addended by: Patria Mane A on: 10/07/2022 04:11 PM   Modules accepted: Orders

## 2022-10-11 ENCOUNTER — Telehealth: Payer: Self-pay | Admitting: Cardiology

## 2022-10-11 NOTE — Telephone Encounter (Signed)
Patient informed that eliquis 70m twice daily samples were set aside for her to pick up.

## 2022-10-11 NOTE — Telephone Encounter (Addendum)
Sample request for Eliquis received. Indication:Afib  Last office visit: 09/27/22 Marilynn Rail)  Scr: 0.96 (09/15/22)  Age: 66 Weight: 112.8kg  Eliquis 32m two times daily is appropriate dose.

## 2022-10-11 NOTE — Telephone Encounter (Signed)
Patient calling the office for samples of medication:   1.  What medication and dosage are you requesting samples for?   apixaban (ELIQUIS) 5 MG TABS tablet    2.  Are you currently out of this medication? Almost

## 2022-10-17 ENCOUNTER — Telehealth: Payer: Self-pay | Admitting: Cardiology

## 2022-10-17 NOTE — Telephone Encounter (Signed)
Called patient, advised I would route to Dr.Harding's nurse to review if paperwork was received.   Thanks!  Will send to RN.

## 2022-10-17 NOTE — Telephone Encounter (Signed)
Spoke to patient received, informed paperwork for awaiting.

## 2022-10-17 NOTE — Telephone Encounter (Signed)
Patient states she faxed a patient assistance application to the office and she would like a call back to confirm that it has been received.

## 2022-10-20 NOTE — Telephone Encounter (Signed)
Late entry   Faxed  completed patient assistance form for Eliquis to [place] on 10 25/23

## 2022-10-30 ENCOUNTER — Encounter (HOSPITAL_COMMUNITY): Payer: Self-pay

## 2022-10-30 ENCOUNTER — Emergency Department (HOSPITAL_COMMUNITY)
Admission: EM | Admit: 2022-10-30 | Discharge: 2022-10-30 | Disposition: A | Discharge status: Home / Self Care | Payer: Medicare Other | Attending: Emergency Medicine | Admitting: Emergency Medicine

## 2022-10-30 ENCOUNTER — Other Ambulatory Visit: Payer: Self-pay

## 2022-10-30 DIAGNOSIS — Z91040 Latex allergy status: Secondary | ICD-10-CM | POA: Insufficient documentation

## 2022-10-30 DIAGNOSIS — R7309 Other abnormal glucose: Secondary | ICD-10-CM

## 2022-10-30 DIAGNOSIS — I480 Paroxysmal atrial fibrillation: Secondary | ICD-10-CM

## 2022-10-30 DIAGNOSIS — R002 Palpitations: Secondary | ICD-10-CM | POA: Diagnosis present

## 2022-10-30 DIAGNOSIS — Z7901 Long term (current) use of anticoagulants: Secondary | ICD-10-CM | POA: Diagnosis not present

## 2022-10-30 LAB — CBC
HCT: 41.2 % (ref 36.0–46.0)
Hemoglobin: 13.9 g/dL (ref 12.0–15.0)
MCH: 30.7 pg (ref 26.0–34.0)
MCHC: 33.7 g/dL (ref 30.0–36.0)
MCV: 90.9 fL (ref 80.0–100.0)
Platelets: 299 10*3/uL (ref 150–400)
RBC: 4.53 MIL/uL (ref 3.87–5.11)
RDW: 13.3 % (ref 11.5–15.5)
WBC: 9.6 10*3/uL (ref 4.0–10.5)
nRBC: 0 % (ref 0.0–0.2)

## 2022-10-30 LAB — BASIC METABOLIC PANEL
Anion gap: 5 (ref 5–15)
BUN: 18 mg/dL (ref 8–23)
CO2: 26 mmol/L (ref 22–32)
Calcium: 9.1 mg/dL (ref 8.9–10.3)
Chloride: 109 mmol/L (ref 98–111)
Creatinine, Ser: 0.84 mg/dL (ref 0.44–1.00)
GFR, Estimated: >60 mL/min (ref >60)
Glucose, Bld: 109 mg/dL — ABNORMAL HIGH (ref 70–99)
Potassium: 3.9 mmol/L (ref 3.5–5.1)
Sodium: 140 mmol/L (ref 135–145)

## 2022-10-30 LAB — MAGNESIUM: Magnesium: 2.2 mg/dL (ref 1.7–2.4)

## 2022-10-30 MED ORDER — PROPOFOL 10 MG/ML IV BOLUS
0.5000 mg/kg | Freq: Once | INTRAVENOUS | Status: AC
  Administered 2022-10-30: 56.4 mg via INTRAVENOUS
  Filled 2022-10-30: qty 20

## 2022-10-30 NOTE — ED Triage Notes (Signed)
Pt  to ED POV with spouse- reports waking up from sleep with palpitations, no pain, slightly SOB feeling when heart rate is high, hx of afib, cardioversion in Sept, currently on anticoagulants as prescribed.

## 2022-10-30 NOTE — ED Provider Notes (Signed)
[facility] EMERGENCY DEPARTMENT Provider Note   CSN: [id number] Arrival date & time: 10/30/22  0149     History  Chief Complaint  Patient presents with   Palpitations    Maria Meza is a 66 y.o. female.  The history is provided by the patient.  Palpitations She has history of transient ischemic attack, paroxysmal atrial fibrillation anticoagulated on apixaban, obstructive sleep apnea and comes in because of recurrence of atrial fibrillation.  She was awakened at 12:20 AM with a sense of heartburn and pressure in her chest and shortness of breath.  She used a blood pressure monitor and noted her heart rate was as high as 150.  This is the way she felt when she had atrial fibrillation in September.  At that time, she needed to be stay in the hospital for 5 days.   Home Medications Prior to Admission medications   Medication Sig Start Date End Date Taking? Authorizing Provider  apixaban (ELIQUIS) 5 MG TABS tablet Take 1 tablet (5 mg total) by mouth 2 (two) times daily. 09/15/22   Orson Eva, MD  Ascorbic Acid (VITAMIN C) 1000 MG tablet Take 1,000 mg by mouth daily.    [provider]  b complex vitamins tablet Take by mouth daily.    [provider]  folic acid (FOLVITE) 1 MG tablet Take 2 mg by mouth daily. 2 tablets daily 04/21/11   [provider]  latanoprost (XALATAN) 0.005 % ophthalmic solution Place 1 drop into both eyes daily.     [provider]  levothyroxine (SYNTHROID) 125 MCG tablet Take 1 tablet (125 mcg total) by mouth daily. Patient taking differently: Take 137 mcg by mouth daily. 09/16/22   Orson Eva, MD  losartan (COZAAR) 25 MG tablet Take 1 tablet (25 mg total) by mouth daily. Take in the pm as well 10/07/22 02/04/23  Deberah Pelton, NP  magnesium oxide (MAG-OX) 400 MG tablet TAKE 1 TABLET BY MOUTH TWICE A DAY 10/03/22   Leonie Man, MD  metoprolol tartrate (LOPRESSOR) 25 MG tablet Take 1 tablet (25 mg total) by mouth 2  (two) times daily. 10/07/22   Deberah Pelton, NP  mometasone (ELOCON) 0.1 % lotion Apply 1 application topically.    [provider]  Vitamin D, Ergocalciferol, (DRISDOL) 1.25 MG (50000 UNIT) CAPS capsule Take 50,000 Units by mouth once a week. 04/17/21   [provider]      Allergies    Latex, Gluten meal, Hydrocodone, Pacerone [amiodarone], Propafenone hcl, and Verapamil    Review of Systems   Review of Systems  Cardiovascular:  Positive for palpitations.  All other systems reviewed and are negative.   Physical Exam Updated Vital Signs BP (!) 134/109 (BP Location: Right Arm)   Pulse (!) 141   Temp 97.8 F (36.6 C) (Oral)   Resp 20   Ht 5' 5"  (1.651 m)   Wt 112.8 kg   LMP 04/16/2016 (Exact Date)   SpO2 96%   BMI 41.38 kg/m  Physical Exam Vitals and nursing note reviewed.   66 year old female, resting comfortably and in no acute distress. Vital signs are significant for elevated blood pressure and heart rate. Oxygen saturation is 96%, which is normal. Head is normocephalic and atraumatic. PERRLA, EOMI. Oropharynx is clear. Neck is nontender and supple without adenopathy or JVD. Back is nontender and there is no CVA tenderness. Lungs are clear without rales, wheezes, or rhonchi. Chest is nontender. Heart has is tachycardic and  irregular without murmur. Abdomen is soft, flat, nontender. Extremities have no cyanosis or edema, full range of motion is present. Skin is warm and dry without rash. Neurologic: Mental status is normal, cranial nerves are intact, moves all extremities equally.  ED Results / Procedures / Treatments   Labs (all labs ordered are listed, but only abnormal results are displayed) Labs Reviewed  BASIC METABOLIC PANEL - Abnormal; Notable for the following components:      Result Value   Glucose, Bld 109 (*)    All other components within normal limits  MAGNESIUM  CBC    EKG EKG Interpretation  Date/Time:  Sunday October 30 2022 02:48:14 EST Ventricular Rate:  119 PR Interval:    QRS Duration: 127 QT Interval:  324 QTC Calculation: 456 R Axis:   2 Text Interpretation: Atrial fibrillation with rapid ventricular response Paired ventricular premature complexes Right bundle branch block When compared with ECG of 09/14/2022, Atrial fibrillation with rapid ventricular response has replaced Sinus rhythm Confirmed by Delora Fuel ([id number]) on 10/30/2022 3:12:59 AM   EKG Interpretation  Date/Time:  Sunday October 30 2022 03:13:29 EST Ventricular Rate:  74 PR Interval:  143 QRS Duration: 126 QT Interval:  399 QTC Calculation: 443 R Axis:   11 Text Interpretation: Sinus rhythm Right bundle branch block When compared with ECG of EARLIER SAME DATE Sinus rhythm has replaced Atrial fibrillation with rapid ventricular response Confirmed by Delora Fuel ([id number]) on 10/30/2022 3:22:03 AM        Procedures .Sedation  Date/Time: 10/30/2022 3:17 AM  Performed by: Delora Fuel, MD Authorized by: Delora Fuel, MD   Consent:    Consent obtained:  Written   Consent given by:  Patient   Risks discussed:  Allergic reaction, prolonged hypoxia resulting in organ damage, inadequate sedation, respiratory compromise necessitating ventilatory assistance and intubation, nausea and vomiting   Alternatives discussed:  Anxiolysis Universal protocol:    Procedure explained and questions answered to patient or proxy's satisfaction: yes     Relevant documents present and verified: yes     Test results available: yes     Imaging studies available: yes     Required blood products, implants, devices, and special equipment available: yes     Site/side marked: yes     Immediately prior to procedure, a time out was called: yes     Patient identity confirmed:  Arm band and verbally with patient Indications:    Procedure performed:  Cardioversion   Procedure necessitating sedation performed by:  Physician performing sedation Pre-sedation  assessment:    Time since last food or drink:  6 hours   ASA classification: class 1 - normal, healthy patient     Mouth opening:  3 or more finger widths   Thyromental distance:  3 finger widths   Mallampati score:  I - soft palate, uvula, fauces, pillars visible   Neck mobility: normal     Pre-sedation assessments completed and reviewed: airway patency, cardiovascular function, hydration status, mental status, pain level, respiratory function and temperature     Pre-sedation assessments completed and reviewed: pre-procedure nausea and vomiting status not reviewed     Pre-sedation assessment completed:  10/30/2022 3:05 AM Immediate pre-procedure details:    Reassessment: Patient reassessed immediately prior to procedure     Reviewed: vital signs, relevant labs/tests and NPO status     Verified: bag valve mask available, emergency equipment available, intubation equipment available, IV patency confirmed, oxygen available and suction available   Procedure details (  see MAR for exact dosages):    Preoxygenation:  Nasal cannula   Sedation:  Propofol   Intended level of sedation: deep   Intra-procedure monitoring:  Blood pressure monitoring, cardiac monitor, continuous capnometry, continuous pulse oximetry, frequent LOC assessments and frequent vital sign checks   Intra-procedure events: none     Total Provider sedation time (minutes):  30 Post-procedure details:    Post-sedation assessment completed:  10/30/2022 3:35 AM   Attendance: Constant attendance by certified staff until patient recovered     Recovery: Patient returned to pre-procedure baseline     Post-sedation assessments completed and reviewed: airway patency, cardiovascular function, hydration status, mental status, pain level, respiratory function and temperature     Post-sedation assessments completed and reviewed: post-procedure nausea and vomiting status not reviewed     Patient is stable for discharge or admission: yes      Procedure completion:  Tolerated well, no immediate complications .Cardioversion  Date/Time: 10/30/2022 3:20 AM  Performed by: Delora Fuel, MD Authorized by: Delora Fuel, MD   Consent:    Consent obtained:  Written   Consent given by:  Patient   Risks discussed:  Cutaneous burn, death, induced arrhythmia and pain   Alternatives discussed:  Alternative treatment Universal protocol:    Procedure explained and questions answered to patient or proxy's satisfaction: yes     Relevant documents present and verified: yes     Test results available and properly labeled: yes     Imaging studies available: yes     Required blood products, implants, devices, and special equipment available: yes     Site/side marked: yes     Immediately prior to procedure a time out was called: yes     Patient identity confirmed:  Arm band and verbally with patient Pre-procedure details:    Cardioversion basis:  Emergent   Rhythm:  Atrial fibrillation   Electrode placement:  Anterior-posterior Patient sedated: Yes. Refer to sedation procedure documentation for details of sedation.  Attempt one:    Cardioversion mode:  Synchronous   Waveform:  Biphasic   Shock (Joules):  120   Shock outcome:  Conversion to normal sinus rhythm Post-procedure details:    Patient status:  Awake   Patient tolerance of procedure:  Tolerated well, no immediate complications   Cardiac monitor shows atrial fibrillation with rapid ventricular response, per my interpretation.  Medications Ordered in ED Medications  propofol (DIPRIVAN) 10 mg/mL bolus/IV push 56.4 mg (56.4 mg Intravenous Given 10/30/22 0310)    ED Course/ Medical Decision Making/ A&P                           Medical Decision Making Amount and/or Complexity of Data Reviewed Labs: ordered. ECG/medicine tests: ordered.   Paroxysmal atrial fibrillation with rapid ventricular response.  I have reviewed and interpreted her electrocardiogram, and my  interpretation is atrial fibrillation with rapid ventricular response with occasional PVCs, right bundle branch block.  I have discussed treatment options with the patient, and she has elected to proceed with emergent cardioversion.  I have ordered screening labs for atrial fibrillation.  I have reviewed and interpreted the laboratory test, and my interpretation is mild elevation of random glucose and otherwise normal metabolic panel, normal CBC.  Cardioversion was done under procedural sedation with propofol with successful conversion to sinus rhythm.  I have reviewed and interpreted her post conversion ECG and my interpretation is sinus rhythm with right bundle branch block.  Patient has been observed in the emergency department and rhythm has been stable.  She is discharged with follow-up in the atrial fibrillation clinic at [place] Stroke Risk Points  Current as of 5 minutes ago     2 >= 2 Points: High Risk  1 - 1.99 Points: Medium Risk  0 Points: Low Risk    No Change      Details    This score determines the patient's risk of having a stroke if the  patient has atrial fibrillation.       Points Metrics  0 Has Congestive Heart Failure:  No    Current as of 5 minutes ago  0 Has Vascular Disease:  No    Current as of 5 minutes ago  0 Has Hypertension:  No    Current as of 5 minutes ago  1 Age:  85    Current as of 5 minutes ago  0 Has Diabetes:  No    Current as of 5 minutes ago  0 Had Stroke:  No  Had TIA:  No  Had Thromboembolism:  No    Current as of 5 minutes ago  1 Female:  Yes    Current as of 5 minutes ago    Final Clinical Impression(s) / ED Diagnoses Final diagnoses:  None    Rx / DC Orders ED Discharge Orders          Ordered    Amb referral to AFIB Clinic        10/30/22 8832              Delora Fuel, MD 54/98/26 [phone]

## 2022-10-30 NOTE — ED Notes (Signed)
Code cart at bedside, pt has defib pads in place. Oxygen placed on pt at 2L via [place], CO2 monitor in place. Obtaining consent at this time.

## 2022-10-31 ENCOUNTER — Encounter: Payer: Self-pay | Admitting: Cardiology

## 2022-10-31 DIAGNOSIS — I4891 Unspecified atrial fibrillation: Secondary | ICD-10-CM

## 2022-10-31 DIAGNOSIS — I471 Supraventricular tachycardia, unspecified: Secondary | ICD-10-CM

## 2022-11-03 NOTE — Telephone Encounter (Signed)
Standard protocol to get in to see EP for A-fib especially with recurrent A-fib as/concerned is A-fib clinic referral.  This is to get people in to see EP.  Glenetta Hew, MD

## 2022-11-04 NOTE — Telephone Encounter (Signed)
Referral for EP  completed

## 2022-11-09 ENCOUNTER — Ambulatory Visit (HOSPITAL_COMMUNITY): Payer: Medicare Other | Admitting: Physician Assistant

## 2022-11-21 ENCOUNTER — Encounter: Payer: Self-pay | Admitting: Cardiology

## 2022-11-21 ENCOUNTER — Ambulatory Visit: Payer: Medicare Other | Attending: Cardiology | Admitting: Cardiology

## 2022-11-21 VITALS — BP 110/77 | HR 53 | Ht 65.0 in | Wt 246.6 lb

## 2022-11-21 DIAGNOSIS — G4733 Obstructive sleep apnea (adult) (pediatric): Secondary | ICD-10-CM

## 2022-11-21 DIAGNOSIS — I493 Ventricular premature depolarization: Secondary | ICD-10-CM

## 2022-11-21 DIAGNOSIS — I471 Supraventricular tachycardia, unspecified: Secondary | ICD-10-CM

## 2022-11-21 DIAGNOSIS — I4891 Unspecified atrial fibrillation: Secondary | ICD-10-CM

## 2022-11-21 DIAGNOSIS — I4729 Other ventricular tachycardia: Secondary | ICD-10-CM

## 2022-11-21 NOTE — Patient Instructions (Signed)

## 2022-11-21 NOTE — Progress Notes (Unsigned)
Primary Care Provider: Celene Squibb, MD [place] Cardiologist: Glenetta Hew, MD Electrophysiologist: None  Clinic Note: No chief complaint on file.   ===================================  ASSESSMENT/PLAN   Problem List Items Addressed This Visit       Cardiology Problems   Nonsustained ventricular tachycardia ([place]) very short bursts; negative ischemic evaluation. - Primary (Chronic)   Atrial fibrillation with RVR (HCC)   PSVT (paroxysmal supraventricular tachycardia) (Chronic)   Symptomatic PVCs (Chronic)     Other   Obesity, Class III, BMI 40-49.9 (morbid obesity) ([place])   OSA on CPAP (Chronic)    ===================================  HPI:    Maria Meza is a 66 y.o. female with a PMH below who presents today for ***. Maria Meza is a 66 y.o. female who is being seen today for the evaluation of *** at the request of Celene Squibb, MD.  Maria Meza was last seen on ***  Recent Hospitalizations: ***  Reviewed  CV studies:    The following studies were reviewed today: (if available, images/films reviewed: From Epic Chart or Care Everywhere) ***:  Interval History:   Maria Meza   CV Review of Symptoms (Summary): Cardiovascular ROS: {roscv:310661}  REVIEWED OF SYSTEMS   ROS  I have reviewed and (if needed) personally updated the patient's problem list, medications, allergies, past medical and surgical history, social and family history.   PAST MEDICAL HISTORY   Past Medical History:  Diagnosis Date   Absolute anemia    No longer on iron supplementation.   Adult celiac disease 2018   Anxiety    Arrhythmia 06/2016   Event monitor has shown PVCs, PSVT at 1 short burst of NSVT.;  Event monitor from March 2021: 6 short runs of wide-complex tachycardia and 25 runs of PSVT--intolerant of high-dose beta-blocker because of bradycardia.   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  (ANA positive, dsDNA negative)   Dysmenorrhea    Glaucoma    History of COVID-19 02/19/2020   IBS (irritable bowel syndrome)    Morbid obesity with BMI of 40.0-44.9, adult ([place]) 09/2020   BMI 43.07   MRSA infection 4-end-21   on abdomen   OSA on CPAP    Thyroid disease    hypothyroidism   TIA (transient ischemic attack)    2 per patient   Urinary incontinence    with sneezing, coughing    PAST SURGICAL HISTORY   Past Surgical History:  Procedure Laterality Date   CARDIAC EVENT MONITOR  06/2016   [facility]) Noted PVCs, PSVT and NSVT (1 episode of 20 beats). ->  PT evaluation suggested PVCs appear to have been completely interpolated.  Felt to be septal   CARDIAC MRI -ADENOSINE / STRESS  05/28/2020   [facility]): EF 69%. Normal LV size, thickness and function w/ NO RWMA  CO & CI 6.5 L/min & 3 L/min.      Normal RV size thickness and function.  No RV WMA or aneurysm.  No findings c/w  ARVC.  Both atria are mildly enlarged.  Trileaflet aortic valve with no evidence of stenosis.  Adenosine Stress Imaging: no  inducible myocardial ischemia OR prior infarction/scar or infiltrate. Normal AoV. Mid TR.   CARDIOVERSION N/A 09/14/2022   Procedure: CARDIOVERSION;  Surgeon: Jerline Pain, MD;  Location: [facility] ORS;  Service:  Cardiovascular;  Laterality: N/A;   CATARACT EXTRACTION Right 03/2018   CATARACT EXTRACTION Left 03/2018   CHOLECYSTECTOMY     LAPAROSCOPIC TUBAL LIGATION  1986   NUCLEAR STRESS TEST  05/2014   EF 55%.  Mixed anteroseptal defect consistent with breast attenuation artifact.   TEE WITHOUT CARDIOVERSION N/A 09/14/2022   Procedure: TRANSESOPHAGEAL ECHOCARDIOGRAM (TEE);  Surgeon: Jerline Pain, MD;  Location: [facility] ORS;  Service: Cardiovascular;  Laterality: N/A;   TRANSTHORACIC ECHOCARDIOGRAM  08/2016   [facility]- Normal LV size and function-EF~55%.  No RWMA..  Normal LA  pressures.  Normal RV function.  Trivial AR, PR and TR.  No stenoses.    TRANSTHORACIC ECHOCARDIOGRAM  02/14/2022   Difficult images.  Normal EF 60 to 65%.  No RWMA.  Normal diastolic parameters.  Normal RV size and function.  Mild to moderate LA dilation.  Normal aortic and mitral valves.  Acing aorta measured roughly 38 mm.   ZIO PATCH CARDIAC EVENT MONITOR  02/2020   [facility]om Rhytym: SR - min 46 - max 113 bpm, avg 64 bpm. Rare isolated PVC & PACs. 6 short NSVT runs (8-11 beats, 134-245 bpm); 25 runs of  PAT/SVT - fastest 9 beats (200 bpm), longest 10.6 sec (130 bpm). Noted Sx w/ PAT & NSVT during daylight hours, and only once at midnight.   ZIO PATCH MONITOR  01/2022   Predominant SR: Rate range 46-99 bpm, avg 61 bpm.  Rare PACs & PVCs (& rare couplets).  No bigeminy/trigeminy.  (Sx noted w/ PACs and PVCs - & a few Atrial Runs).  35 Atrial Runs + 1 "V. tach "run of 4 Narrow Complex beats; fastest atrial run 5 beats @ 193 bpm & longest 22 beats (12.1 sec) @ avg 108 bpm.    Immunization History  Administered Date(s) Administered   DT (Pediatric) 07/06/2009   Tdap 12/26/2010, 06/25/2021    MEDICATIONS/ALLERGIES   Current Meds  Medication Sig   apixaban (ELIQUIS) 5 MG TABS tablet Take 1 tablet (5 mg total) by mouth 2 (two) times daily.   Ascorbic Acid (VITAMIN C) 1000 MG tablet Take 1,000 mg by mouth daily.   b complex vitamins tablet Take by mouth daily.   folic acid (FOLVITE) 1 MG tablet Take 2 mg by mouth daily. 2 tablets daily   latanoprost (XALATAN) 0.005 % ophthalmic solution Place 1 drop into both eyes daily.    levothyroxine (SYNTHROID) 125 MCG tablet Take 1 tablet (125 mcg total) by mouth daily. (Patient taking differently: Take 137 mcg by mouth daily.)   magnesium oxide (MAG-OX) 400 MG tablet TAKE 1 TABLET BY MOUTH TWICE A DAY   metoprolol tartrate (LOPRESSOR) 25 MG tablet Take 1 tablet (25 mg total) by mouth 2 (two) times daily.   mometasone (ELOCON) 0.1 % lotion Apply 1  application topically.   Vitamin D, Ergocalciferol, (DRISDOL) 1.25 MG (50000 UNIT) CAPS capsule Take 50,000 Units by mouth once a week.   [DISCONTINUED] losartan (COZAAR) 25 MG tablet Take 1 tablet (25 mg total) by mouth daily. Take in the pm as well    Allergies  Allergen Reactions   Latex Other (See Comments)    blisters   Gluten Meal     Pt has celiac disease.    Hydrocodone Nausea And Vomiting   Pacerone [Amiodarone] Other (See Comments)    "does not tolerate"--slows heart rate   Propafenone Hcl     Caused very low HR   Verapamil     Fatigue, constipation  SOCIAL HISTORY/FAMILY HISTORY   Reviewed in Epic:  Pertinent findings:  Social History   Tobacco Use   Smoking status: Never   Smokeless tobacco: Never  Vaping Use   Vaping Use: Never used  Substance Use Topics   Alcohol use: No    Alcohol/week: 0.0 standard drinks of alcohol   Drug use: No   Social History   Social History Narrative   She lives in [place], [place]   Right handed    Caffeine use: 1 cup coffee every morning    OBJCTIVE -PE, EKG, labs   Wt Readings from Last 3 Encounters:  11/21/22 246 lb 9.6 oz (111.9 kg)  10/30/22 248 lb 10.9 oz (112.8 kg)  09/27/22 248 lb 9.6 oz (112.8 kg)    Physical Exam: BP 110/77   Pulse (!) 53   Ht 5' 5"  (1.651 m)   Wt 246 lb 9.6 oz (111.9 kg)   LMP 04/16/2016 (Exact Date)   SpO2 98%   BMI 41.04 kg/m  Physical Exam   Adult ECG Report  Rate: *** ;  Rhythm: {rhythm:17366};   Narrative Interpretation: ***  Recent Labs:  ***  No results found for: "CHOL", "HDL", "LDLCALC", "LDLDIRECT", "TRIG", "CHOLHDL" Lab Results  Component Value Date   CREATININE 0.84 10/30/2022   BUN 18 10/30/2022   NA 140 10/30/2022   K 3.9 10/30/2022   CL 109 10/30/2022   CO2 26 10/30/2022      Latest Ref Rng & Units 10/30/2022    2:33 AM 09/10/2022    4:35 AM 09/09/2022    3:47 PM  CBC  WBC 4.0 - 10.5 K/uL 9.6  9.6  9.4   Hemoglobin 12.0 - 15.0 g/dL 13.9  14.7  15.4    Hematocrit 36.0 - 46.0 % 41.2  44.3  45.9   Platelets 150 - 400 K/uL 299  367  379     No results found for: "HGBA1C" Lab Results  Component Value Date   TSH 1.175 09/09/2022    ================================================== I spent a total of ***minutes with the patient spent in direct patient consultation.  Additional time spent with chart review  / charting (studies, outside notes, etc): *** min Total Time: *** min  Current medicines are reviewed at length with the patient today.  (+/- concerns) ***  Notice: This dictation was prepared with Dragon dictation along with smart phrase technology. Any transcriptional errors that result from this process are unintentional and may not be corrected upon review.  Studies Ordered:   No orders of the defined types were placed in this encounter.  No orders of the defined types were placed in this encounter.   Patient Instructions / Medication Changes & Studies & Tests Ordered   There are no Patient Instructions on file for this visit.     Leonie Man, MD, MS Glenetta Hew, M.D., M.S. Interventional Cardiologist  [facility]  Pager # [phone] Phone # [phone] [address] [place], [place] [address]   Thank you for choosing [facility] at [place]!!

## 2022-11-22 ENCOUNTER — Encounter: Payer: Self-pay | Admitting: Cardiology

## 2022-11-22 NOTE — Assessment & Plan Note (Signed)
Continue CPAP.  

## 2022-11-22 NOTE — Assessment & Plan Note (Signed)
I think she still having episodes of PSVT which may actually be picked precursors of A-fib, but they just not lasting very long.  If she does have evaluation for A-fib ablation, may have also to consider PSVT evaluation as well.  Can continue beta-blocker.

## 2022-11-22 NOTE — Assessment & Plan Note (Signed)
The patient understands the need to lose weight with diet and exercise. We have discussed specific strategies for this.  

## 2022-11-22 NOTE — Assessment & Plan Note (Signed)
Currently on low-dose beta-blocker along with Eliquis for stroke prevention.  This patients CHA2DS2-VASc Score and unadjusted Ischemic Stroke Rate (% per year) is equal to 7.2 % stroke rate/year from a score of 5  Above score calculated as 1 point each if present [CHF, HTN, DM, Vascular=MI/PAD/Aortic Plaque, Age if 65-74, or Female] Above score calculated as 2 points each if present [Age > 75, or Stroke/TIA/TE]   She has follow-up with the A-fib clinic, they can talk about the possibility of additional evaluation.  She may very well benefit from ischemic evaluation in order to think potential antiarrhythmics. She also that has follow-up with Dr. Koren Bound to discuss possibility of A-fib ablation.  Very symptomatic when in A-fib.  Would prefer to avoid antiarrhythmics.  I did briefly discuss with her the concept of A-fib ablation.

## 2022-11-24 ENCOUNTER — Other Ambulatory Visit: Payer: Self-pay

## 2022-11-24 ENCOUNTER — Emergency Department (HOSPITAL_COMMUNITY)
Admission: EM | Admit: 2022-11-24 | Discharge: 2022-11-25 | Disposition: A | Discharge status: Home / Self Care | Payer: Medicare Other | Attending: Emergency Medicine | Admitting: Emergency Medicine

## 2022-11-24 ENCOUNTER — Emergency Department (HOSPITAL_COMMUNITY): Payer: Medicare Other

## 2022-11-24 ENCOUNTER — Encounter (HOSPITAL_COMMUNITY): Payer: Self-pay | Admitting: Emergency Medicine

## 2022-11-24 DIAGNOSIS — I10 Essential (primary) hypertension: Secondary | ICD-10-CM | POA: Diagnosis not present

## 2022-11-24 DIAGNOSIS — R079 Chest pain, unspecified: Secondary | ICD-10-CM

## 2022-11-24 DIAGNOSIS — R0602 Shortness of breath: Secondary | ICD-10-CM | POA: Diagnosis not present

## 2022-11-24 DIAGNOSIS — Z8616 Personal history of COVID-19: Secondary | ICD-10-CM | POA: Diagnosis not present

## 2022-11-24 DIAGNOSIS — R002 Palpitations: Secondary | ICD-10-CM | POA: Insufficient documentation

## 2022-11-24 DIAGNOSIS — Z8673 Personal history of transient ischemic attack (TIA), and cerebral infarction without residual deficits: Secondary | ICD-10-CM | POA: Insufficient documentation

## 2022-11-24 HISTORY — DX: Unspecified atrial fibrillation: I48.91

## 2022-11-24 NOTE — ED Triage Notes (Signed)
Pt from home c/o HTN and intermittent A-fib since 2145 tonight, hx of Abif

## 2022-11-25 DIAGNOSIS — R079 Chest pain, unspecified: Secondary | ICD-10-CM | POA: Diagnosis not present

## 2022-11-25 LAB — CBC
HCT: 39.9 % (ref 36.0–46.0)
Hemoglobin: 13.2 g/dL (ref 12.0–15.0)
MCH: 30.5 pg (ref 26.0–34.0)
MCHC: 33.1 g/dL (ref 30.0–36.0)
MCV: 92.1 fL (ref 80.0–100.0)
Platelets: 302 10*3/uL (ref 150–400)
RBC: 4.33 MIL/uL (ref 3.87–5.11)
RDW: 13.4 % (ref 11.5–15.5)
WBC: 8.7 10*3/uL (ref 4.0–10.5)
nRBC: 0 % (ref 0.0–0.2)

## 2022-11-25 LAB — BASIC METABOLIC PANEL
Anion gap: 8 (ref 5–15)
BUN: 24 mg/dL — ABNORMAL HIGH (ref 8–23)
CO2: 25 mmol/L (ref 22–32)
Calcium: 9.4 mg/dL (ref 8.9–10.3)
Chloride: 107 mmol/L (ref 98–111)
Creatinine, Ser: 0.89 mg/dL (ref 0.44–1.00)
GFR, Estimated: >60 mL/min (ref >60)
Glucose, Bld: 102 mg/dL — ABNORMAL HIGH (ref 70–99)
Potassium: 4.1 mmol/L (ref 3.5–5.1)
Sodium: 140 mmol/L (ref 135–145)

## 2022-11-25 LAB — TROPONIN I (HIGH SENSITIVITY)
Troponin I (High Sensitivity): 4 ng/L (ref <18)
Troponin I (High Sensitivity): 4 ng/L (ref <18)

## 2022-11-25 NOTE — ED Provider Notes (Signed)
[facility] Emergency Department Provider Note MRN:  [id number]  Arrival date & time: 11/25/22     Chief Complaint   Chest pain History of Present Illness   Maria Meza is a 66 y.o. year-old female with a history of A-fib presenting to the ED with chief complaint of chest pain.  This evening at about 9:30 PM patient experienced some mild chest pressure and shortness of breath followed by palpitations, the palpitations felt similar to her recent episodes of A-fib with RVR.  Her heart rate was elevated, she tried the blowing into a straw trick and other techniques to try to slow down her heart rate.  Would not slow down and so came here for evaluation.  On the way here her heart rate normalized.  Currently without symptoms.  Review of Systems  A thorough review of systems was obtained and all systems are negative except as noted in the HPI and PMH.   Patient's Health History    Past Medical History:  Diagnosis Date   Absolute anemia    No longer on iron supplementation.   Adult celiac disease 2018   Anxiety    Arrhythmia 06/2016   Event monitor has shown PVCs, PSVT at 1 short burst of NSVT.;  Event monitor from March 2021: 6 short runs of wide-complex tachycardia and 25 runs of PSVT--intolerant of high-dose beta-blocker because of bradycardia.   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  (ANA positive, dsDNA negative)   Dysmenorrhea    Glaucoma    History of COVID-19 02/19/2020   IBS (irritable bowel syndrome)    Morbid obesity with BMI of 40.0-44.9, adult ([place]) 09/2020   BMI 43.07   MRSA infection 4-end-21   on abdomen   OSA on CPAP    Thyroid disease    hypothyroidism   TIA (transient ischemic attack)    2 per patient   Urinary incontinence     with sneezing, coughing    Past Surgical History:  Procedure Laterality Date   CARDIAC EVENT MONITOR  06/2016   [facility]) Noted PVCs, PSVT and NSVT (1 episode of 20 beats). ->  PT evaluation suggested PVCs appear to have been completely interpolated.  Felt to be septal   CARDIAC MRI -ADENOSINE / STRESS  05/28/2020   [facility]): EF 69%. Normal LV size, thickness and function w/ NO RWMA  CO & CI 6.5 L/min & 3 L/min.      Normal RV size thickness and function.  No RV WMA or aneurysm.  No findings c/w  ARVC.  Both atria are mildly enlarged.  Trileaflet aortic valve with no evidence of stenosis.  Adenosine Stress Imaging: no  inducible myocardial ischemia OR prior infarction/scar or infiltrate. Normal AoV. Mid TR.   CARDIOVERSION N/A 09/14/2022   Procedure: CARDIOVERSION;  Surgeon: Jerline Pain, MD;  Location: [facility];  Service: Cardiovascular;  Laterality: N/A;   CATARACT EXTRACTION Right 03/2018   CATARACT EXTRACTION Left 03/2018   CHOLECYSTECTOMY     LAPAROSCOPIC TUBAL LIGATION  1986   NUCLEAR STRESS TEST  05/2014   EF 55%.  Mixed anteroseptal defect consistent with breast attenuation artifact.   TEE WITHOUT CARDIOVERSION N/A 09/14/2022   Procedure: TRANSESOPHAGEAL ECHOCARDIOGRAM (TEE);  Surgeon: Jerline Pain, MD;  Location: [facility];  Service: Cardiovascular;  Laterality: N/A;   TRANSTHORACIC ECHOCARDIOGRAM  08/2016   [facility]- Normal LV size and function-EF~55%.  No RWMA..  Normal LA pressures.  Normal RV function.  Trivial AR, PR and TR.  No stenoses.    TRANSTHORACIC ECHOCARDIOGRAM  02/14/2022   Difficult images.  Normal EF 60 to 65%.  No RWMA.  Normal diastolic parameters.  Normal RV size and function.  Mild to moderate LA dilation.  Normal aortic and mitral valves.  Acing aorta measured roughly 38 mm.   ZIO PATCH CARDIAC EVENT MONITOR  02/2020   Predom Rhytym: SR - min 46 - max 113 bpm, avg 64 bpm. Rare isolated PVC & PACs. 6 short NSVT runs (8-11 beats, 134-245 bpm); 25 runs of  PAT/SVT - fastest 9  beats (200 bpm), longest 10.6 sec (130 bpm). Noted Sx w/ PAT & NSVT during daylight hours, and only once at midnight.   ZIO PATCH MONITOR  01/2022   Predominant SR: Rate range 46-99 bpm, avg 61 bpm.  Rare PACs & PVCs (& rare couplets).  No bigeminy/trigeminy.  (Sx noted w/ PACs and PVCs - & a few Atrial Runs).  35 Atrial Runs + 1 "V. tach "run of 4 Narrow Complex beats; fastest atrial run 5 beats @ 193 bpm & longest 22 beats (12.1 sec) @ avg 108 bpm.    Family History  Problem Relation Age of Onset   Heart attack Father        dec age 22   Hypertension Father    Hypertension Mother    Hyperlipidemia Mother    Migraines Mother    Seizures Mother        epilepsy   Stroke Mother        TIA's   Thyroid disease Sister        hypothyroid   Thyroid disease Sister        hypothyroid   Rheum arthritis Maternal Grandmother    Migraines Maternal Grandmother    Cancer Maternal Grandfather 87       colon ca--DEC age 80   Diabetes Maternal Grandfather    Stroke Paternal Grandmother        multiple   Thyroid disease Paternal Grandmother        goiter-hypothyroid   Breast cancer Maternal Aunt     Social History   Socioeconomic History   Marital status: Married    Spouse name: Not on file   Number of children: Not on file   Years of education: Not on file   Highest education level: Not on file  Occupational History   Not on file  Tobacco Use   Smoking status: Never   Smokeless tobacco: Never  Vaping Use   Vaping Use: Never used  Substance and Sexual Activity   Alcohol use: No    Alcohol/week: 0.0 standard drinks of alcohol   Drug use: No   Sexual activity: Not Currently    Partners: Male    Birth control/protection: Post-menopausal  Other Topics Concern   Not on file  Social History Narrative   She lives in [place], [place]   Right handed    Caffeine use: 1 cup coffee every morning   Social Determinants of Health   Financial Resource Strain: Not on file  Food Insecurity:  No Food Insecurity (09/10/2022)   Hunger Vital Sign    Worried About Running Out of Food in the Last Year: Never true    [place] in the Last Year: Never true  Transportation  Needs: No Transportation Needs (09/10/2022)   PRAPARE - Hydrologist (Medical): No    Lack of Transportation (Non-Medical): No  Physical Activity: Not on file  Stress: Not on file  Social Connections: Not on file  Intimate Partner Violence: Not At Risk (09/10/2022)   Humiliation, Afraid, Rape, and Kick questionnaire    Fear of Current or Ex-Partner: No    Emotionally Abused: No    Physically Abused: No    Sexually Abused: No     Physical Exam   Vitals:   11/25/22 0100 11/25/22 0130  BP: (!) 101/56 (!) 111/58  Pulse: 68 64  Resp: 12 18  Temp:    SpO2: 96% 97%    CONSTITUTIONAL: Well-appearing, NAD NEURO/PSYCH:  Alert and oriented x 3, no focal deficits EYES:  eyes equal and reactive ENT/NECK:  no LAD, no JVD CARDIO: Regular rate, well-perfused, normal S1 and S2 PULM:  CTAB no wheezing or rhonchi GI/GU:  non-distended, non-tender MSK/SPINE:  No gross deformities, no edema SKIN:  no rash, atraumatic   *Additional and/or pertinent findings included in MDM below  Diagnostic and Interventional Summary    EKG Interpretation  Date/Time:  Thursday November 24 2022 23:22:26 EST Ventricular Rate:  65 PR Interval:  151 QRS Duration: 138 QT Interval:  437 QTC Calculation: 455 R Axis:   -1 Text Interpretation: Sinus rhythm Right bundle branch block Minimal ST elevation, lateral leads No significant change was found Confirmed by Gerlene Fee [phone]) on 11/24/2022 11:37:26 PM       Labs Reviewed  BASIC METABOLIC PANEL - Abnormal; Notable for the following components:      Result Value   Glucose, Bld 102 (*)    BUN 24 (*)    All other components within normal limits  CBC  TROPONIN I (HIGH SENSITIVITY)  TROPONIN I (HIGH SENSITIVITY)    DG Chest Port 1 View   Final Result      Medications - No data to display   Procedures  /  Critical Care Procedures  ED Course and Medical Decision Making  Initial Impression and Ddx Suspect symptoms related to palpitations in the setting of intermittent A-fib.  Patient is anticoagulated, endorses full compliance.  Currently without symptoms.  Did have chest pain preceding the palpitations and so ACS is also considered.  No known history of coronary artery disease but has a few risk factors.  Will need 2 troponins.  Past medical/surgical history that increases complexity of ED encounter: A-fib  Interpretation of Diagnostics I personally reviewed the EKG and my interpretation is as follows: Sinus rhythm, bundle branch block, no significant change from prior  Labs reassuring with no significant blood count or electrolyte disturbance, troponin negative x 2  Patient Reassessment and Ultimate Disposition/Management     Patient continues to rest comfortably with no symptoms, no ectopy, no A-fib, appropriate for discharge.  Patient management required discussion with the following services or consulting groups:  None  Complexity of Problems Addressed Acute illness or injury that poses threat of life of bodily function  Additional Data Reviewed and Analyzed Further history obtained from: Further history from spouse/family member  Additional Factors Impacting ED Encounter Risk Consideration of hospitalization  Barth Kirks. Sedonia Small, MD [place] [email or web address]  Final Clinical Impressions(s) / ED Diagnoses     ICD-10-CM   1. Chest pain, unspecified type  R07.9     2. Palpitations  R00.2  ED Discharge Orders     None        Discharge Instructions Discussed with and Provided to Patient:     Discharge Instructions      You were evaluated in the Emergency Department and after careful evaluation, we did not find any emergent condition  requiring admission or further testing in the hospital.  Your exam/testing today is overall reassuring.  No signs of heart damage or other emergencies, recommend continued follow-up with your cardiologist.  Please return to the Emergency Department if you experience any worsening of your condition.   Thank you for allowing [place] to be a part of your care.       Maudie Flakes, MD 11/25/22 [phone]

## 2022-11-25 NOTE — Discharge Instructions (Signed)
You were evaluated in the Emergency Department and after careful evaluation, we did not find any emergent condition requiring admission or further testing in the hospital.  Your exam/testing today is overall reassuring.  No signs of heart damage or other emergencies, recommend continued follow-up with your cardiologist.  Please return to the Emergency Department if you experience any worsening of your condition.   Thank you for allowing [place] to be a part of your care.

## 2022-11-28 ENCOUNTER — Ambulatory Visit (HOSPITAL_COMMUNITY)
Admission: RE | Admit: 2022-11-28 | Discharge: 2022-11-28 | Disposition: A | Discharge status: Home / Self Care | Payer: Medicare Other | Source: Ambulatory Visit | Attending: Physician Assistant | Admitting: Physician Assistant

## 2022-11-28 ENCOUNTER — Encounter (HOSPITAL_COMMUNITY): Payer: Self-pay | Admitting: Physician Assistant

## 2022-11-28 VITALS — BP 138/88 | HR 46 | Ht 65.0 in | Wt 246.0 lb

## 2022-11-28 DIAGNOSIS — E669 Obesity, unspecified: Secondary | ICD-10-CM | POA: Diagnosis not present

## 2022-11-28 DIAGNOSIS — Z8673 Personal history of transient ischemic attack (TIA), and cerebral infarction without residual deficits: Secondary | ICD-10-CM | POA: Diagnosis not present

## 2022-11-28 DIAGNOSIS — I4819 Other persistent atrial fibrillation: Secondary | ICD-10-CM | POA: Insufficient documentation

## 2022-11-28 DIAGNOSIS — E039 Hypothyroidism, unspecified: Secondary | ICD-10-CM | POA: Insufficient documentation

## 2022-11-28 DIAGNOSIS — Z6841 Body Mass Index (BMI) 40.0 and over, adult: Secondary | ICD-10-CM | POA: Insufficient documentation

## 2022-11-28 DIAGNOSIS — G4733 Obstructive sleep apnea (adult) (pediatric): Secondary | ICD-10-CM | POA: Insufficient documentation

## 2022-11-28 DIAGNOSIS — Z7901 Long term (current) use of anticoagulants: Secondary | ICD-10-CM | POA: Diagnosis not present

## 2022-11-28 DIAGNOSIS — Z79899 Other long term (current) drug therapy: Secondary | ICD-10-CM | POA: Diagnosis not present

## 2022-11-28 DIAGNOSIS — D6869 Other thrombophilia: Secondary | ICD-10-CM | POA: Insufficient documentation

## 2022-11-28 NOTE — Progress Notes (Signed)
Primary Care Physician: Celene Squibb, MD Primary Cardiologist: Dr Ellyn Hack Primary Electrophysiologist: Dr Myles Gip  Referring Physician: [facility]   Maria Meza is a 66 y.o. female with a history of OSA, SVT, TIA, hypothyroidism, atrial fibrillation who presents for consultation in the [facility].  She has a remote history of SVT and PVCs and had an EP study many years ago, no ablation. The patient was initially diagnosed with atrial fibrillation 08/2022 after presenting to the ED with symptoms of palpitations, lightheadedness, and mild dyspnea. She was started on Eliquis for a CHADS2VASC score of 4 and underwent TEE guided DCCV. She was again seen at the ED 10/30/22 with palpitations and underwent repeat DCCV. There are no specific triggers for her afib that she could idenitfy. She denies significant alcohol use and is compliant with her CPAP.   Today, she denies symptoms of palpitations, chest pain, shortness of breath, orthopnea, PND, lower extremity edema, dizziness, presyncope, syncope, bleeding, or neurologic sequela. The patient is tolerating medications without difficulties and is otherwise without complaint today.    Atrial Fibrillation Risk Factors:  she does have symptoms or diagnosis of sleep apnea. she is compliant with CPAP therapy. she does not have a history of rheumatic fever. she does not have a history of alcohol use.   she has a BMI of Body mass index is 40.94 kg/m.Marland Kitchen Filed Weights   11/28/22 0831  Weight: 111.6 kg    Family History  Problem Relation Age of Onset   Heart attack Father        dec age 60   Hypertension Father    Hypertension Mother    Hyperlipidemia Mother    Migraines Mother    Seizures Mother        epilepsy   Stroke Mother        TIA's   Thyroid disease Sister        hypothyroid   Thyroid disease Sister        hypothyroid   Rheum arthritis Maternal Grandmother    Migraines Maternal Grandmother     Cancer Maternal Grandfather 45       colon ca--DEC age 32   Diabetes Maternal Grandfather    Stroke Paternal Grandmother        multiple   Thyroid disease Paternal Grandmother        goiter-hypothyroid   Breast cancer Maternal Aunt      Atrial Fibrillation Management history:  Previous antiarrhythmic drugs: amiodarone, propafenone  Previous cardioversions: 09/14/22, 10/30/22 Previous ablations: none CHADS2VASC score: 4 Anticoagulation history: Eliquis   Past Medical History:  Diagnosis Date   Absolute anemia    No longer on iron supplementation.   Adult celiac disease 2018   Anxiety    Arrhythmia 06/2016   Event monitor has shown PVCs, PSVT at 1 short burst of NSVT.;  Event monitor from March 2021: 6 short runs of wide-complex tachycardia and 25 runs of PSVT--intolerant of high-dose beta-blocker because of bradycardia.   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  (ANA positive, dsDNA negative)   Dysmenorrhea    Glaucoma    History of COVID-19 02/19/2020   IBS (irritable bowel syndrome)    Morbid obesity with BMI of 40.0-44.9, adult ([place]) 09/2020   BMI 43.07  MRSA infection 4-end-21   on abdomen   OSA on CPAP    Thyroid disease    hypothyroidism   TIA (transient ischemic attack)    2 per patient   Urinary incontinence    with sneezing, coughing   Past Surgical History:  Procedure Laterality Date   CARDIAC EVENT MONITOR  06/2016   [facility]) Noted PVCs, PSVT and NSVT (1 episode of 20 beats). ->  PT evaluation suggested PVCs appear to have been completely interpolated.  Felt to be septal   CARDIAC MRI -ADENOSINE / STRESS  05/28/2020   [facility]): EF 69%. Normal LV size, thickness and function w/ NO RWMA  CO & CI 6.5 L/min & 3 L/min.      Normal RV size thickness and function.   No RV WMA or aneurysm.  No findings c/w  ARVC.  Both atria are mildly enlarged.  Trileaflet aortic valve with no evidence of stenosis.  Adenosine Stress Imaging: no  inducible myocardial ischemia OR prior infarction/scar or infiltrate. Normal AoV. Mid TR.   CARDIOVERSION N/A 09/14/2022   Procedure: CARDIOVERSION;  Surgeon: Jerline Pain, MD;  Location: [facility];  Service: Cardiovascular;  Laterality: N/A;   CATARACT EXTRACTION Right 03/2018   CATARACT EXTRACTION Left 03/2018   CHOLECYSTECTOMY     LAPAROSCOPIC TUBAL LIGATION  1986   NUCLEAR STRESS TEST  05/2014   EF 55%.  Mixed anteroseptal defect consistent with breast attenuation artifact.   TEE WITHOUT CARDIOVERSION N/A 09/14/2022   Procedure: TRANSESOPHAGEAL ECHOCARDIOGRAM (TEE);  Surgeon: Jerline Pain, MD;  Location: [facility];  Service: Cardiovascular;  Laterality: N/A;   TRANSTHORACIC ECHOCARDIOGRAM  08/2016   [facility]- Normal LV size and function-EF~55%.  No RWMA..  Normal LA pressures.  Normal RV function.  Trivial AR, PR and TR.  No stenoses.    TRANSTHORACIC ECHOCARDIOGRAM  02/14/2022   Difficult images.  Normal EF 60 to 65%.  No RWMA.  Normal diastolic parameters.  Normal RV size and function.  Mild to moderate LA dilation.  Normal aortic and mitral valves.  Acing aorta measured roughly 38 mm.   ZIO PATCH CARDIAC EVENT MONITOR  02/2020   Predom Rhytym: SR - min 46 - max 113 bpm, avg 64 bpm. Rare isolated PVC & PACs. 6 short NSVT runs (8-11 beats, 134-245 bpm); 25 runs of  PAT/SVT - fastest 9 beats (200 bpm), longest 10.6 sec (130 bpm). Noted Sx w/ PAT & NSVT during daylight hours, and only once at midnight.   ZIO PATCH MONITOR  01/2022   Predominant SR: Rate range 46-99 bpm, avg 61 bpm.  Rare PACs & PVCs (& rare couplets).  No bigeminy/trigeminy.  (Sx noted w/ PACs and PVCs - & a few Atrial Runs).  35 Atrial Runs + 1 "V. tach "run of 4 Narrow Complex beats; fastest atrial run 5 beats @ 193 bpm & longest 22 beats (12.1 sec) @ avg 108 bpm.     Current Outpatient Medications  Medication Sig Dispense Refill   apixaban (ELIQUIS) 5 MG TABS tablet Take 1 tablet (5 mg total) by mouth 2 (two) times daily. 60 tablet 1   Ascorbic Acid (VITAMIN C) 1000 MG tablet Take 1,000 mg by mouth daily.     b complex vitamins tablet Take by mouth daily.     folic acid (FOLVITE) 1 MG tablet Take 2 mg by mouth daily. 2 tablets daily     latanoprost (XALATAN) 0.005 % ophthalmic solution Place 1 drop into both eyes daily.  levothyroxine (SYNTHROID) 137 MCG tablet Take 137 mcg by mouth daily.     magnesium oxide (MAG-OX) 400 MG tablet TAKE 1 TABLET BY MOUTH TWICE A DAY 180 tablet 3   metoprolol tartrate (LOPRESSOR) 25 MG tablet Take 1 tablet (25 mg total) by mouth 2 (two) times daily. 180 tablet 3   mometasone (ELOCON) 0.1 % lotion Apply 1 application topically.     Vitamin D, Ergocalciferol, (DRISDOL) 1.25 MG (50000 UNIT) CAPS capsule Take 50,000 Units by mouth once a week.     No current facility-administered medications for this encounter.    Allergies  Allergen Reactions   Amiodarone Other (See Comments)    "does not tolerate"--slows heart rate   Latex Other (See Comments)    blisters   Gluten Meal     Pt has celiac disease.    Hydrocodone Nausea And Vomiting   Propafenone Hcl     Caused very low HR   Verapamil     Fatigue, constipation    Social History   Socioeconomic History   Marital status: Married    Spouse name: Not on file   Number of children: Not on file   Years of education: Not on file   Highest education level: Not on file  Occupational History   Not on file  Tobacco Use   Smoking status: Never   Smokeless tobacco: Never   Tobacco comments:    Never smoke 11/28/22  Vaping Use   Vaping Use: Never used  Substance and Sexual Activity   Alcohol use: No    Alcohol/week: 0.0 standard drinks of alcohol   Drug use: No   Sexual activity: Not Currently    Partners: Male    Birth control/protection:  Post-menopausal  Other Topics Concern   Not on file  Social History Narrative   She lives in [place], [place]   Right handed    Caffeine use: 1 cup coffee every morning   Social Determinants of Health   Financial Resource Strain: Not on file  Food Insecurity: No Food Insecurity (09/10/2022)   Hunger Vital Sign    Worried About Running Out of Food in the Last Year: Never true    [place] in the Last Year: Never true  Transportation Needs: No Transportation Needs (09/10/2022)   PRAPARE - Hydrologist (Medical): No    Lack of Transportation (Non-Medical): No  Physical Activity: Not on file  Stress: Not on file  Social Connections: Not on file  Intimate Partner Violence: Not At Risk (09/10/2022)   Humiliation, Afraid, Rape, and Kick questionnaire    Fear of Current or Ex-Partner: No    Emotionally Abused: No    Physically Abused: No    Sexually Abused: No     ROS- All systems are reviewed and negative except as per the HPI above.  Physical Exam: Vitals:   11/28/22 0831  BP: 138/88  Pulse: (!) 46  Weight: 111.6 kg  Height: 5' 5"  (1.651 m)    GEN- The patient is a well appearing obese female, alert and oriented x 3 today.   Head- normocephalic, atraumatic Eyes-  Sclera clear, conjunctiva pink Ears- hearing intact Oropharynx- clear Neck- supple  Lungs- Clear to ausculation bilaterally, normal work of breathing Heart- Regular rate and rhythm, bradycardia, no murmurs, rubs or gallops  GI- soft, NT, ND, + BS Extremities- no clubbing, cyanosis, or edema MS- no significant deformity or atrophy Skin- no rash or lesion Psych- euthymic mood,  full affect Neuro- strength and sensation are intact  Wt Readings from Last 3 Encounters:  11/28/22 111.6 kg  11/24/22 111.6 kg  11/21/22 111.9 kg    EKG today demonstrates  SB, RBBB Vent. rate 46 BPM PR interval 148 ms QRS duration 122 ms QT/QTcB 436/381 ms  Echo 02/14/22 demonstrated   1.  Difficult acoustic windows. Difficult to see endocardium . Left  ventricular ejection fraction, by estimation, is 60 to 65%. The left  ventricle has normal function. Left ventricular diastolic parameters were  normal.   2. Right ventricular systolic function is normal. The right ventricular  size is normal.   3. Left atrial size was mild to moderately dilated.   4. The mitral valve is normal in structure. Trivial mitral valve  regurgitation.   5. The aortic valve is tricuspid. Aortic valve regurgitation is not  visualized.   6. Aortic dilatation noted. There is borderline dilatation of the  ascending aorta, measuring 38 mm.   Epic records are reviewed at length today  CHA2DS2-VASc Score = 4  The patient's score is based upon: CHF History: 0 HTN History: 0 Diabetes History: 0 Stroke History: 2 (TIA) Vascular Disease History: 0 Age Score: 1 Gender Score: 1       ASSESSMENT AND PLAN: 1. Persistent Atrial Fibrillation (ICD10:  I48.19) The patient's CHA2DS2-VASc score is 4, indicating a 4.8% annual risk of stroke.   Patient previously failed propafenone and amiodarone due to bradycardia. Currently tolerating Lopressor at present dose. We discussed alternate AAD options (flecainide, dofetilide) and ablation. She would like to consider her options and speak with Dr Myles Gip at her upcoming appointment.   Continue Eliquis 5 mg BID Continue Lopressor 25 mg BID. Patient may take an extra 12.5 mg PRN for heart racing.   2. Secondary Hypercoagulable State (ICD10:  D68.69) The patient is at significant risk for stroke/thromboembolism based upon her CHA2DS2-VASc Score of 4.  Continue Apixaban (Eliquis).   3. Obesity Body mass index is 40.94 kg/m. Lifestyle modification was discussed at length including regular exercise and weight reduction.  4. Obstructive sleep apnea The importance of adequate treatment of sleep apnea was discussed today in order to improve our ability to maintain sinus  rhythm long term. Encouraged compliance with CPAP therapy.    Follow up with Dr Myles Gip as scheduled.    [facility] [address] [place], [place] [address] [phone] 11/28/2022 8:43 AM

## 2022-12-09 ENCOUNTER — Ambulatory Visit: Payer: Medicare Other | Attending: Cardiovascular Disease | Admitting: Cardiovascular Disease

## 2022-12-09 ENCOUNTER — Encounter: Payer: Self-pay | Admitting: Cardiovascular Disease

## 2022-12-09 VITALS — BP 118/70 | HR 49 | Ht 65.0 in | Wt 249.0 lb

## 2022-12-09 DIAGNOSIS — I480 Paroxysmal atrial fibrillation: Secondary | ICD-10-CM | POA: Insufficient documentation

## 2022-12-09 NOTE — Patient Instructions (Signed)
Medication Instructions:  Your physician recommends that you continue on your current medications as directed. Please refer to the Current Medication list given to you today.  *If you need a refill on your cardiac medications before your next appointment, please call your pharmacy*   Lab Work: BMET and CBC prior to CT scan and ablation (see instruction letter) If you have labs (blood work) drawn today and your tests are completely normal, you will receive your results only by: [place] (if you have MyChart) OR A paper copy in the mail If you have any lab test that is abnormal or we need to change your treatment, we will call you to review the results.  Testing/Procedures: Your physician has requested that you have cardiac CT. Cardiac computed tomography (CT) is a painless test that uses an x-ray machine to take clear, detailed pictures of your heart. For further information please visit [email or web address]. Please follow instruction sheet as given. You will be called to arrange this appointment.   Your physician has recommended that you have an ablation. Catheter ablation is a medical procedure used to treat some cardiac arrhythmias (irregular heartbeats). During catheter ablation, a long, thin, flexible tube is put into a blood vessel in your groin (upper thigh), or neck. This tube is called an ablation catheter. It is then guided to your heart through the blood vessel. Radio frequency waves destroy small areas of heart tissue where abnormal heartbeats may cause an arrhythmia to start. Please see the instruction sheet given to you today.  Follow-Up: At [facility], you and your health needs are our priority.  As part of our continuing mission to provide you with exceptional heart care, we have created designated Provider Care Teams.  These Care Teams include your primary Cardiologist (physician) and Advanced Practice Providers (APPs -  Physician Assistants and Nurse  Practitioners) who all work together to provide you with the care you need, when you need it.  Your next appointment:   We will call you to arrange follow up appointments.   Important Information About Sugar

## 2022-12-09 NOTE — Progress Notes (Signed)
Electrophysiology Office Note:    Date:  12/09/2022   ID:  [name], DOB 1956-06-21, MRN [id number]  PCP:  Maria Squibb, MD   [facility] Providers Cardiologist:  Maria Hew, MD Electrophysiologist:  Maria Quitter, MD     Referring MD: Maria Man, MD   History of Present Illness:    Maria Meza is a 66 y.o. female with a hx listed below, significant for OSA, SVT, AF, and TIA referred for arrhythmia management.  She has a history of PVCs and SVT. She had an EP study many years ago but no ablation.  She was diagnosed with AF in September, 2023, after presenting to the ED with palpitations, lightheadedness, and shortness of breath. She had a DCCV. She had recurrence in 10/2022 again with ER visit and DCCV. She has failed amiodarone and propafenone due to bradycardia.   Today, she feels well -- no palpitations, chest pain, shortness of breath.  Past Medical History:  Diagnosis Date   Absolute anemia    No longer on iron supplementation.   Adult celiac disease 2018   Anxiety    Arrhythmia 06/2016   Event monitor has shown PVCs, PSVT at 1 short burst of NSVT.;  Event monitor from March 2021: 6 short runs of wide-complex tachycardia and 25 runs of PSVT--intolerant of high-dose beta-blocker because of bradycardia.   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  (ANA positive, dsDNA negative)   Dysmenorrhea    Glaucoma    History of COVID-19 02/19/2020   IBS (irritable bowel syndrome)    Morbid obesity with BMI of 40.0-44.9, adult ([place]) 09/2020   BMI 43.07   MRSA infection 4-end-21   on abdomen   OSA on CPAP    Thyroid disease    hypothyroidism   TIA (transient ischemic attack)    2 per patient   Urinary incontinence    with sneezing, coughing     Past Surgical History:  Procedure Laterality Date   CARDIAC EVENT MONITOR  06/2016   [facility]) Noted PVCs, PSVT and NSVT (1 episode of 20 beats). ->  PT evaluation suggested PVCs appear to have been completely interpolated.  Felt to be septal   CARDIAC MRI -ADENOSINE / STRESS  05/28/2020   [facility]): EF 69%. Normal LV size, thickness and function w/ NO RWMA  CO & CI 6.5 L/min & 3 L/min.      Normal RV size thickness and function.  No RV WMA or aneurysm.  No findings c/w  ARVC.  Both atria are mildly enlarged.  Trileaflet aortic valve with no evidence of stenosis.  Adenosine Stress Imaging: no  inducible myocardial ischemia OR prior infarction/scar or infiltrate. Normal AoV. Mid TR.   CARDIOVERSION N/A 09/14/2022   Procedure: CARDIOVERSION;  Surgeon: Jerline Pain, MD;  Location: [facility];  Service: Cardiovascular;  Laterality: N/A;   CATARACT EXTRACTION Right 03/2018   CATARACT EXTRACTION Left 03/2018   CHOLECYSTECTOMY     LAPAROSCOPIC TUBAL LIGATION  1986   NUCLEAR STRESS TEST  05/2014   EF 55%.  Mixed anteroseptal defect consistent with breast attenuation artifact.   TEE WITHOUT CARDIOVERSION N/A 09/14/2022   Procedure: TRANSESOPHAGEAL ECHOCARDIOGRAM (TEE);  Surgeon: Jerline Pain, MD;  Location: [facility];  Service: Cardiovascular;  Laterality: N/A;  TRANSTHORACIC ECHOCARDIOGRAM  08/2016   [facility]- Normal LV size and function-EF~55%.  No RWMA..  Normal LA pressures.  Normal RV function.  Trivial AR, PR and TR.  No stenoses.    TRANSTHORACIC ECHOCARDIOGRAM  02/14/2022   Difficult images.  Normal EF 60 to 65%.  No RWMA.  Normal diastolic parameters.  Normal RV size and function.  Mild to moderate LA dilation.  Normal aortic and mitral valves.  Acing aorta measured roughly 38 mm.   ZIO PATCH CARDIAC EVENT MONITOR  02/2020   [facility] Rhytym: SR - min 46 - max 113 bpm, avg 64 bpm. Rare isolated PVC & PACs. 6 short NSVT runs (8-11 beats, 134-245 bpm); 25 runs of  PAT/SVT - fastest 9 beats (200 bpm), longest  10.6 sec (130 bpm). Noted Sx w/ PAT & NSVT during daylight hours, and only once at midnight.   ZIO PATCH MONITOR  01/2022   Predominant SR: Rate range 46-99 bpm, avg 61 bpm.  Rare PACs & PVCs (& rare couplets).  No bigeminy/trigeminy.  (Sx noted w/ PACs and PVCs - & a few Atrial Runs).  35 Atrial Runs + 1 "V. tach "run of 4 Narrow Complex beats; fastest atrial run 5 beats @ 193 bpm & longest 22 beats (12.1 sec) @ avg 108 bpm.    Current Medications: Current Meds  Medication Sig   apixaban (ELIQUIS) 5 MG TABS tablet Take 1 tablet (5 mg total) by mouth 2 (two) times daily.   Ascorbic Acid (VITAMIN C) 1000 MG tablet Take 1,000 mg by mouth daily.   b complex vitamins tablet Take by mouth daily.   folic acid (FOLVITE) 1 MG tablet Take 2 mg by mouth daily. 2 tablets daily   latanoprost (XALATAN) 0.005 % ophthalmic solution Place 1 drop into both eyes daily.    levothyroxine (SYNTHROID) 137 MCG tablet Take 137 mcg by mouth daily.   magnesium oxide (MAG-OX) 400 MG tablet TAKE 1 TABLET BY MOUTH TWICE A DAY (Patient taking differently: Take 1 tablet by mouth daily.)   metoprolol tartrate (LOPRESSOR) 25 MG tablet Take 1 tablet (25 mg total) by mouth 2 (two) times daily.   mometasone (ELOCON) 0.1 % lotion Apply 1 application topically.   Vitamin D, Ergocalciferol, (DRISDOL) 1.25 MG (50000 UNIT) CAPS capsule Take 50,000 Units by mouth once a week.     Allergies:   Amiodarone, Latex, Gluten meal, Hydrocodone, Propafenone hcl, and Verapamil   Social History   Socioeconomic History   Marital status: Married    Spouse name: Not on file   Number of children: Not on file   Years of education: Not on file   Highest education level: Not on file  Occupational History   Not on file  Tobacco Use   Smoking status: Never   Smokeless tobacco: Never   Tobacco comments:    Never smoke 11/28/22  Vaping Use   Vaping Use: Never used  Substance and Sexual Activity   Alcohol use: No    Alcohol/week: 0.0  standard drinks of alcohol   Drug use: No   Sexual activity: Not Currently    Partners: Male    Birth control/protection: Post-menopausal  Other Topics Concern   Not on file  Social History Narrative   She lives in [place], [place]   Right handed    Caffeine use: 1 cup coffee every morning   Social Determinants of Health   Financial Resource Strain: Not on file  Food Insecurity: No Food Insecurity (09/10/2022)  Hunger Vital Sign    Worried About Running Out of Food in the Last Year: Never true    Ran Out of Food in the Last Year: Never true  Transportation Needs: No Transportation Needs (09/10/2022)   PRAPARE - Hydrologist (Medical): No    Lack of Transportation (Non-Medical): No  Physical Activity: Not on file  Stress: Not on file  Social Connections: Not on file     Family History: The patient's family history includes Breast cancer in her maternal aunt; Cancer (age of onset: 35) in her maternal grandfather; Diabetes in her maternal grandfather; Heart attack in her father; Hyperlipidemia in her mother; Hypertension in her father and mother; Migraines in her maternal grandmother and mother; Rheum arthritis in her maternal grandmother; Seizures in her mother; Stroke in her mother and paternal grandmother; Thyroid disease in her paternal grandmother, sister, and sister.  ROS:   Please see the history of present illness.    All other systems reviewed and are negative.  EKGs/Labs/Other Studies Reviewed Today:     TTE 09/14/22: Normal LV function, LA is moderately dilated  EKG:  Last EKG results: today - Sinus rhythm, RBBB   Recent Labs: 09/09/2022: ALT 32; TSH 1.175 10/30/2022: Magnesium 2.2 11/25/2022: BUN 24; Creatinine, Ser 0.89; Hemoglobin 13.2; Platelets 302; Potassium 4.1; Sodium 140     Physical Exam:    VS:  BP 118/70   Pulse (!) 49   Ht 5' 5"  (1.651 m)   Wt 249 lb (112.9 kg)   LMP 04/16/2016 (Exact Date)   SpO2 98%   BMI 41.44  kg/m     Wt Readings from Last 3 Encounters:  12/09/22 249 lb (112.9 kg)  11/28/22 246 lb (111.6 kg)  11/24/22 246 lb (111.6 kg)     GEN: Well nourished, well developed in no acute distress CARDIAC: RRR, no murmurs, rubs, gallops RESPIRATORY:  Normal work of breathing MUSCULOSKELETAL: no edema    ASSESSMENT & PLAN:    Atrial fibrillation:  persistent, failed propafenone and amiodarone. We discussed the indication, rationale, logistics, anticipated benefits, and potential risks of the ablation procedure including but not limited to -- bleed at the groin access site, chest pain, damage to nearby organs such as the diaphragm, lungs, or esophagus, need for a drainage tube, or prolonged hospitalization. I explained that the risk for stroke, heart attack, need for open chest surgery, or even death is very low but not zero. We also discussed Tikosyn as an option. she  expressed understanding and wishes to proceed with ablation. History of PVCs: continue to monitor Secondary hypercoagulable state: CHADs2Vasc 4, continue apixaban. OSA: consistent CPAP use is essential for maintenance of sinus rhythm Obesity: weight loss is important for maintenance of sinus rhythm        Medication Adjustments/Labs and Tests Ordered: Current medicines are reviewed at length with the patient today.  Concerns regarding medicines are outlined above.  Orders Placed This Encounter  Procedures   CT CARDIAC MORPH/PULM VEIN W/CM&W/O CA SCORE   CBC with Differential/Platelet   Basic metabolic panel   EKG [id number]   No orders of the defined types were placed in this encounter.    Signed, Maria Quitter, MD  12/09/2022 10:32 AM    [place]

## 2022-12-15 ENCOUNTER — Encounter: Payer: Self-pay | Admitting: Cardiology

## 2023-01-20 ENCOUNTER — Telehealth: Payer: Self-pay | Admitting: *Deleted

## 2023-01-20 NOTE — Telephone Encounter (Signed)
Called patient to inform her that. patient assistance application   will need to resent to  [employer] states because she still has coverage 12/25/22. The application has to be resubmitted in 2024.   RN informed patient will  resubmit.  RN also discussed with patient  same information concerning her Husband , who is also patient of Dr Ellyn Hack . An application  was sent in at same time.  Representative for [employer]  he would need the "LIS "denial letter sent with application .  RN sent website info to patient via [place]. [email or web address]  RN informed patient to do for her and Husband,  She verbalized understanding- she states husband will be coming Monday and both will need samples. RN inform will will need to see if any are available on Monday .

## 2023-02-03 ENCOUNTER — Telehealth (HOSPITAL_COMMUNITY): Payer: Self-pay | Admitting: *Deleted

## 2023-02-03 NOTE — Telephone Encounter (Signed)
Spoke with patient who reports her PCP believes fluctuating levels of her thyroid hormone may be causing her episodes of A-fib. They are working together on medication adjustments and patient states PCP will recheck thyroid levels on 03/08/2023.  Patient is hopeful she will not need to have an ablation, but will call to reschedule if she wishes to proceed.  A-fib Ablation procedure for 03/08/2023 cancelled per patient request.  Patient verbalized understanding and expressed appreciation for follow-up.

## 2023-02-03 NOTE — Telephone Encounter (Signed)
Patient calling to cancel her cardiac CT and also ablation. She states that she is going through some medication adjustments with her thyroid. Her PCP advised her hold off on her ablation until things get sorted out.  I canceled her cardiac CT and sent a message to Dr. Karel Jarvis team.  Patient verbalized understanding.  Gordy Clement RN Navigator Cardiac Imaging [facility] [phone] Office [phone] Cell

## 2023-02-15 NOTE — Telephone Encounter (Signed)
Spoke to wife. She will pick up samples

## 2023-03-01 ENCOUNTER — Inpatient Hospital Stay (HOSPITAL_COMMUNITY): Admission: RE | Admit: 2023-03-01 | Payer: Medicare Other | Source: Ambulatory Visit

## 2023-03-08 ENCOUNTER — Ambulatory Visit (HOSPITAL_COMMUNITY): Admit: 2023-03-08 | Payer: Medicare Other | Admitting: Cardiovascular Disease

## 2023-03-08 ENCOUNTER — Encounter (HOSPITAL_COMMUNITY): Payer: Self-pay

## 2023-03-08 SURGERY — ATRIAL FIBRILLATION ABLATION
Anesthesia: General

## 2023-03-22 IMAGING — MG MM DIGITAL SCREENING BILAT W/ TOMO AND CAD
8 series · 8 of 24 positions shown · non-contrast
Comparison: Previous exam(s).

CLINICAL DATA: Screening.

EXAM:
DIGITAL SCREENING BILATERAL MAMMOGRAM WITH TOMOSYNTHESIS AND CAD
TECHNIQUE: Bilateral screening digital craniocaudal and mediolateral oblique
mammograms were obtained. Bilateral screening digital breast
tomosynthesis was performed. The images were evaluated with
computer-aided detection.

[L MLO synth-2D]
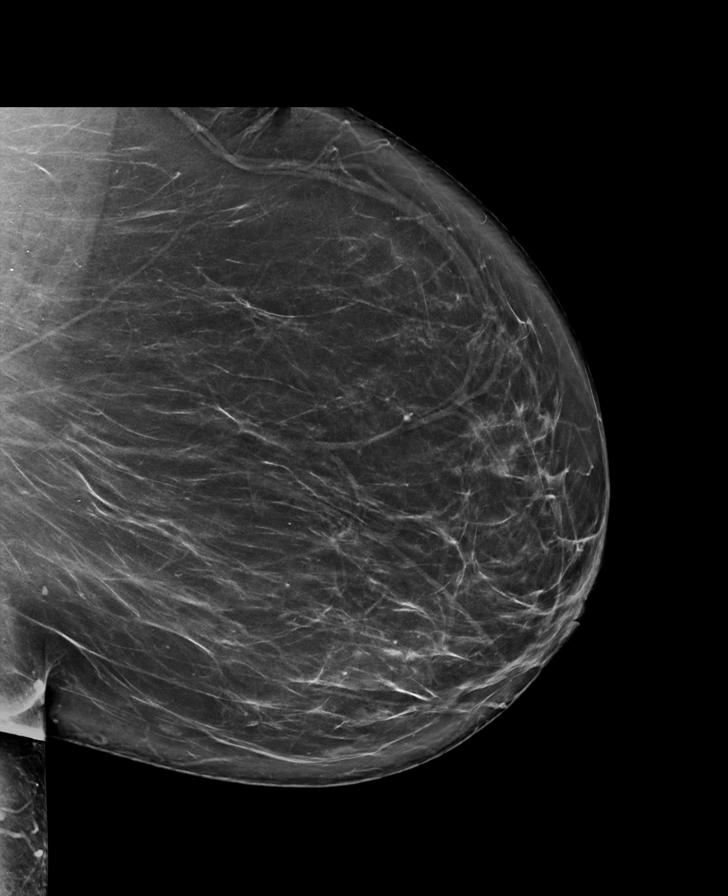

[L CC synth-2D]
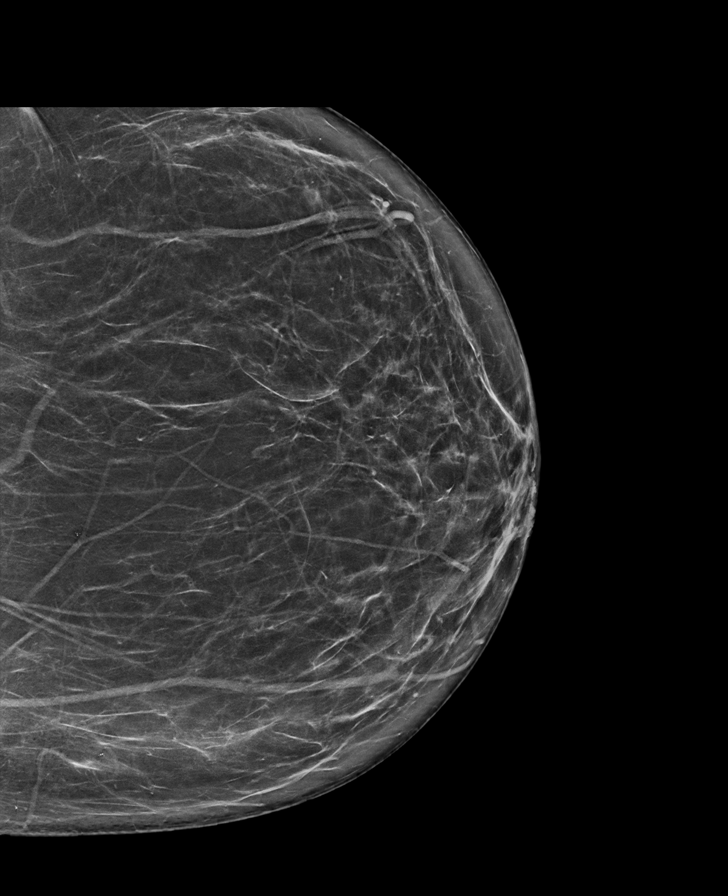

[R CC synth-2D]
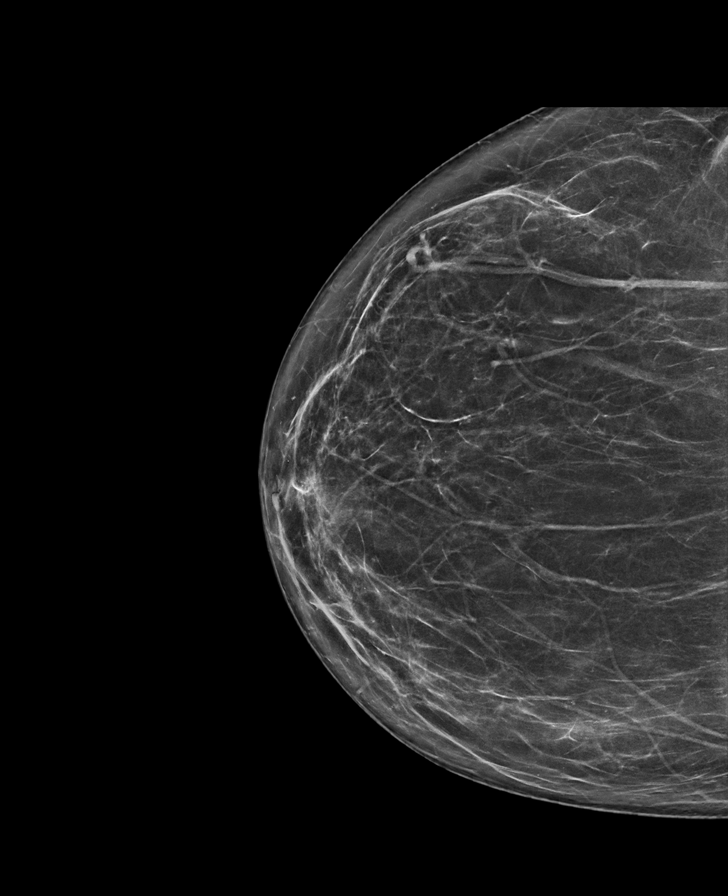

[R MLO synth-2D]
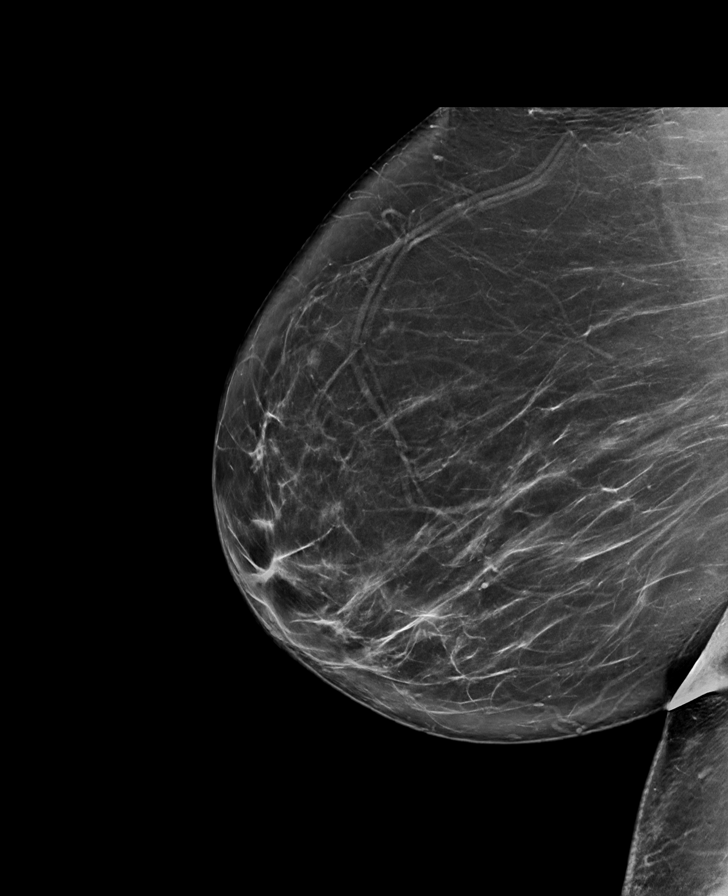

[R CC tomo · tomo slice 37/74.0]
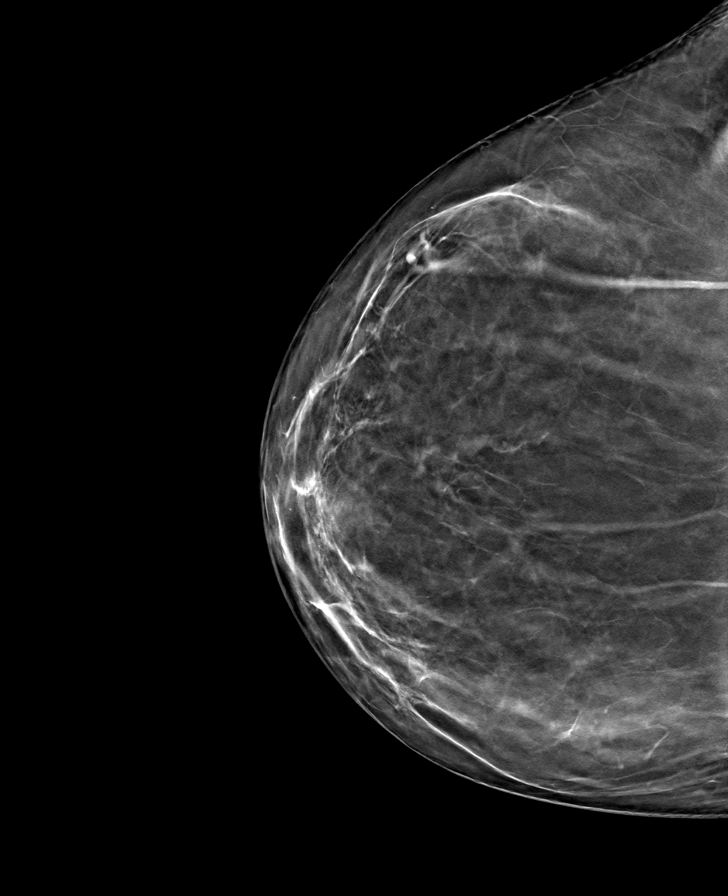

[R MLO tomo · tomo slice 45/90.0]
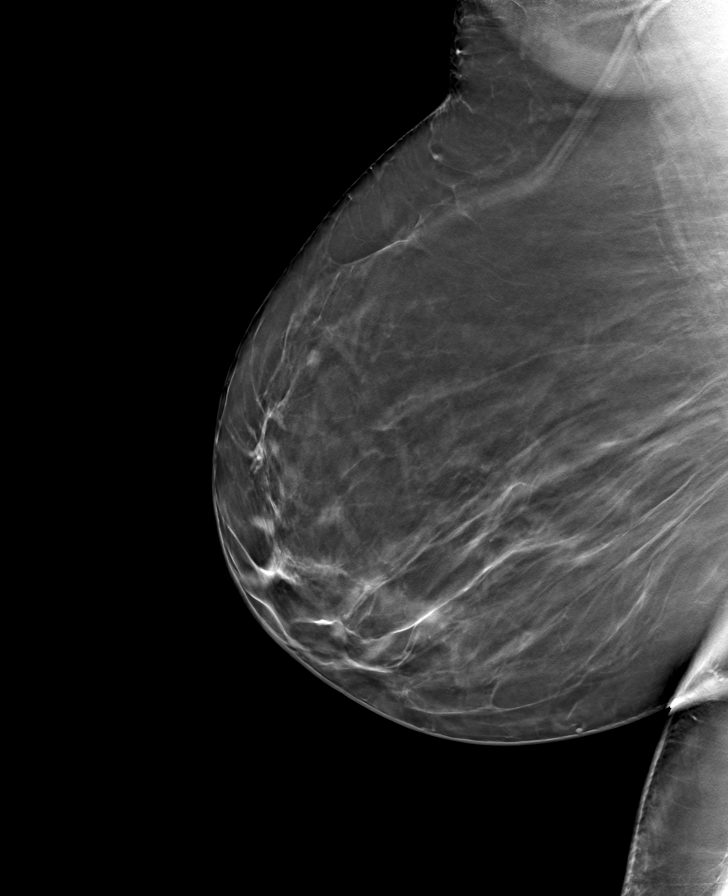

[L MLO tomo · tomo slice 47/92.0]
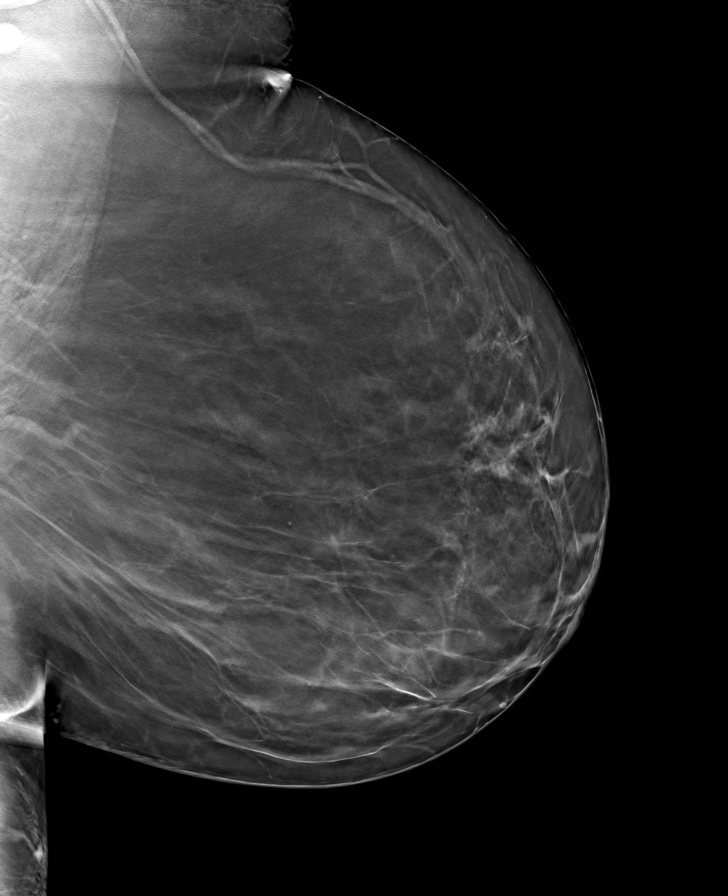

[L CC tomo · tomo slice 41/81.0]
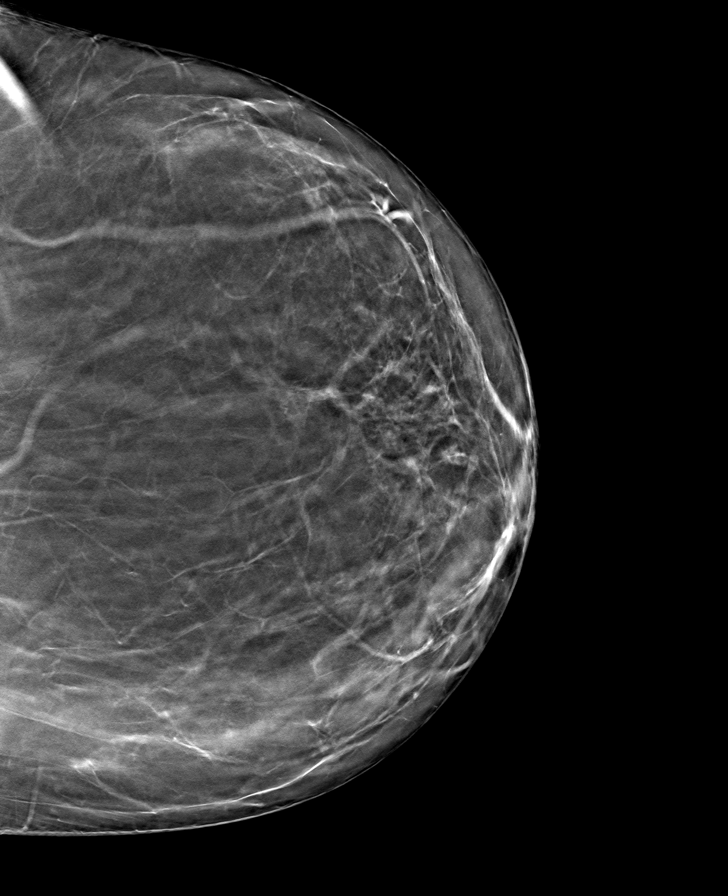

[8 of 24 positions shown; findings below may reference images not displayed]

ACR Breast Density Category b: There are scattered areas of
fibroglandular density.
FINDINGS: There are no findings suspicious for malignancy.
IMPRESSION: No mammographic evidence of malignancy. A result letter of this
screening mammogram will be mailed directly to the patient.

RECOMMENDATION:
Screening mammogram in one year. (Code:51-O-LD2)

BI-RADS CATEGORY  1: Negative.

## 2023-04-05 ENCOUNTER — Ambulatory Visit (HOSPITAL_COMMUNITY)
Admission: RE | Admit: 2023-04-05 | Discharge: 2023-04-05 | Disposition: A | Discharge status: Home / Self Care | Payer: Medicare Other | Source: Ambulatory Visit | Attending: Physician Assistant | Admitting: Physician Assistant

## 2023-04-05 ENCOUNTER — Encounter (HOSPITAL_COMMUNITY): Payer: Self-pay | Admitting: Physician Assistant

## 2023-04-05 VITALS — BP 126/84 | HR 52 | Ht 65.0 in | Wt 256.2 lb

## 2023-04-05 DIAGNOSIS — Z7901 Long term (current) use of anticoagulants: Secondary | ICD-10-CM | POA: Insufficient documentation

## 2023-04-05 DIAGNOSIS — Z8673 Personal history of transient ischemic attack (TIA), and cerebral infarction without residual deficits: Secondary | ICD-10-CM | POA: Insufficient documentation

## 2023-04-05 DIAGNOSIS — D6869 Other thrombophilia: Secondary | ICD-10-CM | POA: Diagnosis not present

## 2023-04-05 DIAGNOSIS — Z6841 Body Mass Index (BMI) 40.0 and over, adult: Secondary | ICD-10-CM | POA: Insufficient documentation

## 2023-04-05 DIAGNOSIS — G4733 Obstructive sleep apnea (adult) (pediatric): Secondary | ICD-10-CM | POA: Insufficient documentation

## 2023-04-05 DIAGNOSIS — I4819 Other persistent atrial fibrillation: Secondary | ICD-10-CM | POA: Diagnosis not present

## 2023-04-05 NOTE — Progress Notes (Signed)
Primary Care Physician: Benita Stabile, MD Primary Cardiologist: Dr Herbie Baltimore Primary Electrophysiologist: Dr Nelly Laurence  Referring Physician: [facility]   Maria Meza is a 67 y.o. female with a history of OSA, SVT, TIA, hypothyroidism, atrial fibrillation who presents for follow up in the [facility].  She has a remote history of SVT and PVCs and had an EP study many years ago, no ablation. The patient was initially diagnosed with atrial fibrillation 08/2022 after presenting to the ED with symptoms of palpitations, lightheadedness, and mild dyspnea. She was started on Eliquis for a CHADS2VASC score of 4 and underwent TEE guided DCCV. She was again seen at the ED 10/30/22 with palpitations and underwent repeat DCCV. She denies significant alcohol use and is compliant with her CPAP.   Patient did see Dr Nelly Laurence 11/2022 and was scheduled for an ablation. However, she started having more palpitations and heat intolerance. Her thyroid levels were checked and she was hyperthyroid. Her levothyroxine dose was reduced. Her ablation was cancelled while her thyroid medication was being adjusted.   On follow up today, patient reports that since adjusting her thyroid medication she has not had any further palpitations. She denies any bleeding issues on anticoagulation.   Today, she denies symptoms of palpitations, chest pain, shortness of breath, orthopnea, PND, lower extremity edema, dizziness, presyncope, syncope, bleeding, or neurologic sequela. The patient is tolerating medications without difficulties and is otherwise without complaint today.    Atrial Fibrillation Risk Factors:  she does have symptoms or diagnosis of sleep apnea. she is compliant with CPAP therapy. she does not have a history of rheumatic fever. she does not have a history of alcohol use.   she has a BMI of Body mass index is 42.63 kg/m.Marland Kitchen Filed Weights   04/05/23 1013  Weight: 116.2 kg    Family  History  Problem Relation Age of Onset   Heart attack Father        dec age 46   Hypertension Father    Hypertension Mother    Hyperlipidemia Mother    Migraines Mother    Seizures Mother        epilepsy   Stroke Mother        TIA's   Thyroid disease Sister        hypothyroid   Thyroid disease Sister        hypothyroid   Rheum arthritis Maternal Grandmother    Migraines Maternal Grandmother    Cancer Maternal Grandfather 6       colon ca--DEC age 67   Diabetes Maternal Grandfather    Stroke Paternal Grandmother        multiple   Thyroid disease Paternal Grandmother        goiter-hypothyroid   Breast cancer Maternal Aunt      Atrial Fibrillation Management history:  Previous antiarrhythmic drugs: amiodarone, propafenone  Previous cardioversions: 09/14/22, 10/30/22 Previous ablations: none CHADS2VASC score: 4 Anticoagulation history: Eliquis   Past Medical History:  Diagnosis Date   Absolute anemia    No longer on iron supplementation.   Adult celiac disease 2018   Anxiety    Arrhythmia 06/2016   Event monitor has shown PVCs, PSVT at 1 short burst of NSVT.;  Event monitor from March 2021: 6 short runs of wide-complex tachycardia and 25 runs of PSVT--intolerant of high-dose beta-blocker because of bradycardia.   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  (ANA positive, dsDNA negative)   Dysmenorrhea    Glaucoma    History of COVID-19 02/19/2020   IBS (irritable bowel syndrome)    Morbid obesity with BMI of 40.0-44.9, adult 09/2020   BMI 43.07   MRSA infection 4-end-21   on abdomen   OSA on CPAP    Thyroid disease    hypothyroidism   TIA (transient ischemic attack)    2 per patient   Urinary incontinence    with sneezing, coughing   Past Surgical History:  Procedure  Laterality Date   CARDIAC EVENT MONITOR  06/2016   [facility]) Noted PVCs, PSVT and NSVT (1 episode of 20 beats). ->  PT evaluation suggested PVCs appear to have been completely interpolated.  Felt to be septal   CARDIAC MRI -ADENOSINE / STRESS  05/28/2020   [facility] EF 69%. Normal LV size, thickness and function w/ NO RWMA  CO & CI 6.5 L/min & 3 L/min.      Normal RV size thickness and function.  No RV WMA or aneurysm.  No findings c/w  ARVC.  Both atria are mildly enlarged.  Trileaflet aortic valve with no evidence of stenosis.  Adenosine Stress Imaging: no  inducible myocardial ischemia OR prior infarction/scar or infiltrate. Normal AoV. Mid TR.   CARDIOVERSION N/A 09/14/2022   Procedure: CARDIOVERSION;  Surgeon: Jake Bathe, MD;  Location: [facility];  Service: Cardiovascular;  Laterality: N/A;   CATARACT EXTRACTION Right 03/2018   CATARACT EXTRACTION Left 03/2018   CHOLECYSTECTOMY     LAPAROSCOPIC TUBAL LIGATION  1986   NUCLEAR STRESS TEST  05/2014   EF 55%.  Mixed anteroseptal defect consistent with breast attenuation artifact.   TEE WITHOUT CARDIOVERSION N/A 09/14/2022   Procedure: TRANSESOPHAGEAL ECHOCARDIOGRAM (TEE);  Surgeon: Jake Bathe, MD;  Location: [facility];  Service: Cardiovascular;  Laterality: N/A;   TRANSTHORACIC ECHOCARDIOGRAM  08/2016   [facility]- Normal LV size and function-EF~55%.  No RWMA..  Normal LA pressures.  Normal RV function.  Trivial AR, PR and TR.  No stenoses.    TRANSTHORACIC ECHOCARDIOGRAM  02/14/2022   Difficult images.  Normal EF 60 to 65%.  No RWMA.  Normal diastolic parameters.  Normal RV size and function.  Mild to moderate LA dilation.  Normal aortic and mitral valves.  Acing aorta measured roughly 38 mm.   ZIO PATCH CARDIAC EVENT MONITOR  02/2020   Predom Rhytym: SR - min 46 - max 113 bpm, avg 64 bpm. Rare isolated PVC & PACs. 6 short NSVT runs (8-11 beats, 134-245 bpm); 25 runs of  PAT/SVT - fastest 9 beats (200 bpm), longest 10.6 sec (130 bpm). Noted Sx w/ PAT &  NSVT during daylight hours, and only once at midnight.   ZIO PATCH MONITOR  01/2022   Predominant SR: Rate range 46-99 bpm, avg 61 bpm.  Rare PACs & PVCs (& rare couplets).  No bigeminy/trigeminy.  (Sx noted w/ PACs and PVCs - & a few Atrial Runs).  35 Atrial Runs + 1 "V. tach "run of 4 Narrow Complex beats; fastest atrial run 5 beats @ 193 bpm & longest 22 beats (12.1 sec) @ avg 108 bpm.    Current Outpatient Medications  Medication Sig Dispense Refill   apixaban (ELIQUIS) 5 MG TABS tablet Take 1 tablet (5 mg total) by mouth 2 (two) times daily. 60 tablet 1   Ascorbic Acid (  VITAMIN C) 1000 MG tablet Take 1,000 mg by mouth daily.     b complex vitamins tablet Take by mouth daily.     benzonatate (TESSALON) 100 MG capsule Take 100 mg by mouth 3 (three) times daily.     folic acid (FOLVITE) 1 MG tablet Take 2 mg by mouth daily. 2 tablets daily     latanoprost (XALATAN) 0.005 % ophthalmic solution Place 1 drop into both eyes daily.      levothyroxine (SYNTHROID) 150 MCG tablet Take 150 mcg by mouth daily.     magnesium oxide (MAG-OX) 400 MG tablet TAKE 1 TABLET BY MOUTH TWICE A DAY (Patient taking differently: Take 1 tablet by mouth daily.) 180 tablet 3   metoprolol tartrate (LOPRESSOR) 25 MG tablet Take 1 tablet (25 mg total) by mouth 2 (two) times daily. 180 tablet 3   mometasone (ELOCON) 0.1 % lotion Apply 1 application topically.     Vitamin D, Ergocalciferol, (DRISDOL) 1.25 MG (50000 UNIT) CAPS capsule Take 50,000 Units by mouth once a week.     No current facility-administered medications for this encounter.    Allergies  Allergen Reactions   Amiodarone Other (See Comments)    "does not tolerate"--slows heart rate   Latex Other (See Comments)    blisters   Gluten Meal     Pt has celiac disease.    Hydrocodone Nausea And Vomiting   Propafenone Hcl     Caused very low HR   Verapamil     Fatigue, constipation    Social History   Socioeconomic History   Marital status: Married     Spouse name: Not on file   Number of children: Not on file   Years of education: Not on file   Highest education level: Not on file  Occupational History   Not on file  Tobacco Use   Smoking status: Never   Smokeless tobacco: Never   Tobacco comments:    Never smoke 11/28/22  Vaping Use   Vaping Use: Never used  Substance and Sexual Activity   Alcohol use: No    Alcohol/week: 0.0 standard drinks of alcohol   Drug use: No   Sexual activity: Not Currently    Partners: Male    Birth control/protection: Post-menopausal  Other Topics Concern   Not on file  Social History Narrative   [place]   Right handed    Caffeine use: 1 cup coffee every morning   Social Determinants of Health   Financial Resource Strain: Not on file  Food Insecurity: No Food Insecurity (09/10/2022)   Hunger Vital Sign    Worried About Running Out of Food in the Last Year: Never true    Ran Out of Food in the Last Year: Never true  Transportation Needs: No Transportation Needs (09/10/2022)   PRAPARE - Administrator, Civil ServiceTransportation    Lack of Transportation (Medical): No    Lack of Transportation (Non-Medical): No  Physical Activity: Not on file  Stress: Not on file  Social Connections: Not on file  Intimate Partner Violence: Not At Risk (09/10/2022)   Humiliation, Afraid, Rape, and Kick questionnaire    Fear of Current or Ex-Partner: No    Emotionally Abused: No    Physically Abused: No    Sexually Abused: No     ROS- All systems are reviewed and negative except as per the HPI above.  Physical Exam: Vitals:   04/05/23 1013  BP: 126/84  Pulse: (!) 52  Weight: 116.2  kg  Height: 5\' 5"  (1.651 m)    GEN- The patient is a well appearing female, alert and oriented x 3 today.   HEENT-head normocephalic, atraumatic, sclera clear, conjunctiva pink, hearing intact, trachea midline. Lungs- Clear to ausculation bilaterally, normal work of breathing Heart- Regular rate and rhythm, no murmurs,  rubs or gallops  GI- soft, NT, ND, + BS Extremities- no clubbing, cyanosis, or edema MS- no significant deformity or atrophy Skin- no rash or lesion Psych- euthymic mood, full affect Neuro- strength and sensation are intact   Wt Readings from Last 3 Encounters:  04/05/23 116.2 kg  12/09/22 112.9 kg  11/28/22 111.6 kg    EKG today demonstrates  SB, RBBB Vent. rate 52 BPM PR interval 144 ms QRS duration 122 ms QT/QTcB 426/396 ms  Echo 02/14/22 demonstrated   1. Difficult acoustic windows. Difficult to see endocardium . Left  ventricular ejection fraction, by estimation, is 60 to 65%. The left  ventricle has normal function. Left ventricular diastolic parameters were  normal.   2. Right ventricular systolic function is normal. The right ventricular  size is normal.   3. Left atrial size was mild to moderately dilated.   4. The mitral valve is normal in structure. Trivial mitral valve  regurgitation.   5. The aortic valve is tricuspid. Aortic valve regurgitation is not  visualized.   6. Aortic dilatation noted. There is borderline dilatation of the  ascending aorta, measuring 38 mm.   Epic records are reviewed at length today  CHA2DS2-VASc Score = 4  The patient's score is based upon: CHF History: 0 HTN History: 0 Diabetes History: 0 Stroke History: 2 (TIA) Vascular Disease History: 0 Age Score: 1 Gender Score: 1       ASSESSMENT AND PLAN: 1. Persistent Atrial Fibrillation (ICD10:  I48.19) The patient's CHA2DS2-VASc score is 4, indicating a 4.8% annual risk of stroke.   Patient previously failed propafenone and amiodarone due to bradycardia.  Patient has not had any further symptoms since her levothyroxine was reduced. She would like to pursue watchful waiting for now and only reschedule her ablation if she has more frequent afib.  Continue Eliquis 5 mg BID Continue Lopressor 25 mg BID. Patient may take an extra 12.5 mg PRN for heart racing.   2. Secondary  Hypercoagulable State (ICD10:  D68.69) The patient is at significant risk for stroke/thromboembolism based upon her CHA2DS2-VASc Score of 4.  Continue Apixaban (Eliquis).   3. Obesity Body mass index is 42.63 kg/m. Lifestyle modification was discussed and encouraged including regular physical activity and weight reduction.  4. Obstructive sleep apnea Encouraged compliance with CPAP therapy.    Follow up with Dr Herbie Baltimore as scheduled. AF clinic in 6 months.    Jorja Loa PA-C Afib Clinic [facility] [address] [place], [place] [address] [phone] 04/05/2023 10:38 AM

## 2023-05-14 NOTE — Progress Notes (Unsigned)
Primary Care Provider: Benita Stabile, MD [facility] Cardiologist: Maria Lemma, MD Electrophysiologist: Maria Small, MD;  Atrial Fibrillation Clinic: Mr. Maria Kea, PA  Clinic Note: No chief complaint on file.   ===================================  ASSESSMENT/PLAN   Problem List Items Addressed This Visit       Cardiology Problems   Persistent atrial fibrillation (HCC) - Primary (Chronic)   Nonsustained ventricular tachycardia (HCC) very short bursts; negative ischemic evaluation. (Chronic)   Hypercoagulable state due to persistent atrial fibrillation (HCC) (Chronic)     Other   OSA on CPAP (Chronic)   Obesity, Class III, BMI 40-49.9 (morbid obesity) (HCC) (Chronic)   ===================================  HPI:    Maria Meza is a 67 y.o. female with a CV-PMH below who presents today for 31-month follow-up at the request of Maria Stabile, MD.  CV PMH Persistent A Fib (08/2022) - initial episode ~ related to adjustment of Thyroid Rx.:  CHA2DS2-VASc score 4 (gender 1, age 84, TIA history 2) TEE-DCCV 09/14/22 -> PO Diltiazem & Lopressor 50 mg BID; Synthroid ~ 15 mcg; metoprolol dose.  Evaluate for adjustment to reduce morning dose to 25 mg due to intermittent dizziness. 10/30/22 [facility]) -> Afib RVR (RBBB) => DCCV in ER Very symptomatic in A-fib dizziness chest discomfort and shortness of breath and sense of fast irregular heart rate. Previously failed propafenone and amiodarone due to bradycardia.  (Amiodarone would be complicated with hyperthyroidism.) PSVT (more like Paroxysmal Atrial Tachycardia) Symptomatic PVCs with short run of NSVT-negative ischemic evaluation.  (Myoview June 2021 nonischemic) OSA on CPAP Obesity   I most recently saw Maria Meza on November 21, 2022 shortly after ER visit to an event with A-fib RVR.  Energy level is definitely better with the reduced dose of beta-blocker in the morning.  Both concerned about  breakthrough A-fib. => Referred to A-fib clinic and EP-Dr. Nelly Meza for consideration of possible ablation of both A-fib and potentially even PAT/PSVT.  (She also has symptomatic PVCs. => Seen by Dr. Nelly Meza in December 2023.  Initial plan was to schedule ablation, however after that she was having more palpitations and heat intolerance and was found to have elevated thyroid levels and her levothyroxine dose was reduced.  This caused a delayed/cancellation of ablation. Has been managed with propafenone as well as amiodarone, stopped because of bradycardia.  Recent Hospitalizations: None  Most recent visit was with Mr. Maria Kea, PA (Afib Clinic) 04/05/2023: Since her thyroid medication was adjusted, she had not had any further palpitations.  No bleeding issues on anticoagulation.  No other symptoms.  Tolerating meds without complication.  Decided to hold off on ablation and adopt a watchful waiting strategy since she is feeling better with thyroid medication adjusted. => Was on Lopressor 25 mg twice daily, okay to take extra dose of 12.5 mg.  Heart racing.  Eliquis 5 mg twice daily.  Encouraged compliance with CPAP.  Reviewed  CV studies:    The following studies were reviewed today: (if available, images/films reviewed: From Epic Chart or Care Everywhere) No new studies:  Interval History:   Maria Meza   CV Review of Symptoms (Summary): {roscv:310661}  REVIEWED OF SYSTEMS   ROS  Energy level + Joint pain Dizziness - w/ Afib Anxiety   I have reviewed and (if needed) personally updated the patient's problem list, medications, allergies, past medical and surgical history, social and family history.   PAST MEDICAL HISTORY   Past Medical History:  Diagnosis Date   Absolute anemia    No longer on iron supplementation.   Adult celiac disease 2018   Anxiety    Arrhythmia 06/2016   Event monitor has shown PVCs, PSVT at 1 short burst of NSVT.;  Event monitor from March 2021: 6  short runs of wide-complex tachycardia and 25 runs of PSVT--intolerant of high-dose beta-blocker because of bradycardia.   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  (ANA positive, dsDNA negative)   Dysmenorrhea    Glaucoma    History of COVID-19 02/19/2020   IBS (irritable bowel syndrome)    Morbid obesity with BMI of 40.0-44.9, adult (HCC) 09/2020   BMI 43.07   MRSA infection 4-end-21   on abdomen   OSA on CPAP    Thyroid disease    hypothyroidism   TIA (transient ischemic attack)    2 per patient   Urinary incontinence    with sneezing, coughing    PAST SURGICAL HISTORY   Past Surgical History:  Procedure Laterality Date   CARDIAC EVENT MONITOR  06/2016   [facility]) Noted PVCs, PSVT and NSVT (1 episode of 20 beats). ->  PT evaluation suggested PVCs appear to have been completely interpolated.  Felt to be septal   CARDIAC MRI -ADENOSINE / STRESS  05/28/2020   [facility]): EF 69%. Normal LV size, thickness and function w/ NO RWMA  CO & CI 6.5 L/min & 3 L/min.      Normal RV size thickness and function.  No RV WMA or aneurysm.  No findings c/w  ARVC.  Both atria are mildly enlarged.  Trileaflet aortic valve with no evidence of stenosis.  Adenosine Stress Imaging: no  inducible myocardial ischemia OR prior infarction/scar or infiltrate. Normal AoV. Mid TR.   CARDIOVERSION N/A 09/14/2022   Procedure: CARDIOVERSION;  Surgeon: Jake Bathe, MD;  Location: [facility];  Service: Cardiovascular;  Laterality: N/A;   CATARACT EXTRACTION Right 03/2018   CATARACT EXTRACTION Left 03/2018   CHOLECYSTECTOMY     LAPAROSCOPIC TUBAL LIGATION  1986   NUCLEAR STRESS TEST  05/2014   EF 55%.  Mixed anteroseptal defect consistent with breast attenuation artifact.   TEE WITHOUT CARDIOVERSION N/A 09/14/2022    Procedure: TRANSESOPHAGEAL ECHOCARDIOGRAM (TEE);  Surgeon: Jake Bathe, MD;  Location: [facility];  Service: Cardiovascular;  Laterality: N/A;   TRANSTHORACIC ECHOCARDIOGRAM  08/2016   [facility]- Normal LV size and function-EF~55%.  No RWMA..  Normal LA pressures.  Normal RV function.  Trivial AR, PR and TR.  No stenoses.    TRANSTHORACIC ECHOCARDIOGRAM  02/14/2022   Difficult images.  Normal EF 60 to 65%.  No RWMA.  Normal diastolic parameters.  Normal RV size and function.  Mild to moderate LA dilation.  Normal aortic and mitral valves.  Acing aorta measured roughly 38 mm.   ZIO PATCH CARDIAC EVENT MONITOR  02/2020   Predom Rhytym: SR - min 46 - max 113 bpm, avg 64 bpm. Rare isolated PVC & PACs. 6 short NSVT runs (8-11 beats, 134-245 bpm); 25 runs of  PAT/SVT - fastest 9 beats (200 bpm), longest 10.6 sec (130 bpm). Noted Sx w/ PAT & NSVT during daylight hours, and only once at midnight.   ZIO PATCH MONITOR  01/2022   Predominant SR: Rate range 46-99 bpm, avg 61  bpm.  Rare PACs & PVCs (& rare couplets).  No bigeminy/trigeminy.  (Sx noted w/ PACs and PVCs - & a few Atrial Runs).  35 Atrial Runs + 1 "V. tach "run of 4 Narrow Complex beats; fastest atrial run 5 beats @ 193 bpm & longest 22 beats (12.1 sec) @ avg 108 bpm.    MEDICATIONS/ALLERGIES   No outpatient medications have been marked as taking for the 05/15/23 encounter (Appointment) with Marykay Lex, MD.    Allergies  Allergen Reactions   Amiodarone Other (See Comments)    "does not tolerate"--slows heart rate   Latex Other (See Comments)    blisters   Gluten Meal     Pt has celiac disease.    Hydrocodone Nausea And Vomiting   Propafenone Hcl     Caused very low HR   Verapamil     Fatigue, constipation    SOCIAL HISTORY/FAMILY HISTORY   Reviewed in Epic:  Pertinent findings:  Social History   Tobacco Use   Smoking status: Never   Smokeless tobacco: Never   Tobacco comments:    Never smoke 11/28/22  Vaping Use   Vaping  Use: Never used  Substance Use Topics   Alcohol use: No    Alcohol/week: 0.0 standard drinks of alcohol   Drug use: No   Social History   Social History Narrative   She lives in [place], [place]   Right handed    Caffeine use: 1 cup coffee every morning    OBJCTIVE -PE, EKG, labs   Wt Readings from Last 3 Encounters:  04/05/23 256 lb 3.2 oz (116.2 kg)  12/09/22 249 lb (112.9 kg)  11/28/22 246 lb (111.6 kg)    Physical Exam: LMP 04/16/2016 (Exact Date)  Physical Exam Vitals reviewed.  Constitutional:      General: She is not in acute distress.    Appearance: Normal appearance. She is obese. She is not ill-appearing or toxic-appearing.  HENT:     Head: Normocephalic and atraumatic.  Neck:     Vascular: No carotid bruit.  Cardiovascular:     Rate and Rhythm: Normal rate and regular rhythm.     Pulses: Normal pulses.     Heart sounds: Normal heart sounds. No murmur heard.    No friction rub. No gallop.  Pulmonary:     Effort: Pulmonary effort is normal. No respiratory distress.     Breath sounds: Normal breath sounds. No wheezing, rhonchi or rales.  Musculoskeletal:        General: Swelling (Trivial bilateral ankle) present. Normal range of motion.     Cervical back: Normal range of motion and neck supple.  Skin:    General: Skin is warm and dry.  Neurological:     General: No focal deficit present.     Mental Status: She is alert and oriented to person, place, and time. Mental status is at baseline.     Gait: Gait normal.  Psychiatric:        Mood and Affect: Mood normal.        Behavior: Behavior normal.        Thought Content: Thought content normal.        Judgment: Judgment normal.     Adult ECG Report  Rate: *** ;  Rhythm: {rhythm:17366};   Narrative Interpretation: ***  Recent Labs:  ***  No results found for: "CHOL", "HDL", "LDLCALC", "LDLDIRECT", "TRIG", "CHOLHDL" Lab Results  Component Value Date   CREATININE 0.89 11/25/2022   BUN  24 (H)  11/25/2022   NA 140 11/25/2022   K 4.1 11/25/2022   CL 107 11/25/2022   CO2 25 11/25/2022      Latest Ref Rng & Units 11/25/2022   12:00 AM 10/30/2022    2:33 AM 09/10/2022    4:35 AM  CBC  WBC 4.0 - 10.5 K/uL 8.7  9.6  9.6   Hemoglobin 12.0 - 15.0 g/dL 16.1  09.6  04.5   Hematocrit 36.0 - 46.0 % 39.9  41.2  44.3   Platelets 150 - 400 K/uL 302  299  367     No results found for: "HGBA1C" Lab Results  Component Value Date   TSH 1.175 09/09/2022    ================================================== I spent a total of ***minutes with the patient spent in direct patient consultation.  Additional time spent with chart review  / charting (studies, outside notes, etc): 7+ *** min Total Time: *** min  Current medicines are reviewed at length with the patient today.  (+/- concerns) ***  Notice: This dictation was prepared with Dragon dictation along with smart phrase technology. Any transcriptional errors that result from this process are unintentional and may not be corrected upon review.  Studies Ordered:   No orders of the defined types were placed in this encounter.  No orders of the defined types were placed in this encounter.   Patient Instructions / Medication Changes & Studies & Tests Ordered   There are no Patient Instructions on file for this visit.     Marykay Lex, MD, MS Maria Meza, M.D., M.S. Interventional Cardiologist  [facility]  Pager # [phone] Phone # [phone] [address] [place], [place] [address]   Thank you for choosing [facility] [place]!!

## 2023-05-15 ENCOUNTER — Encounter: Payer: Self-pay | Admitting: Cardiology

## 2023-05-15 ENCOUNTER — Ambulatory Visit: Payer: Medicare Other | Attending: Cardiology | Admitting: Cardiology

## 2023-05-15 VITALS — BP 126/72 | HR 57 | Ht 65.0 in | Wt 262.4 lb

## 2023-05-15 DIAGNOSIS — I471 Supraventricular tachycardia, unspecified: Secondary | ICD-10-CM

## 2023-05-15 DIAGNOSIS — G4733 Obstructive sleep apnea (adult) (pediatric): Secondary | ICD-10-CM | POA: Diagnosis present

## 2023-05-15 DIAGNOSIS — I4819 Other persistent atrial fibrillation: Secondary | ICD-10-CM

## 2023-05-15 DIAGNOSIS — I4729 Other ventricular tachycardia: Secondary | ICD-10-CM

## 2023-05-15 DIAGNOSIS — D6869 Other thrombophilia: Secondary | ICD-10-CM

## 2023-05-15 NOTE — Assessment & Plan Note (Addendum)
CHA2DS2-VASc score 5 (age, sex, HTN = 3, history of TIA = 2).  Doing well on Eliquis.  No bleeding issues.  She is has the financial assistance and not having any issues with getting the medication.  No bleeding issues.  Okay to hold Eliquis without bridging for any necessary procedures.  Standard we hold 48 hours, but for high risk procedures would hold 72 hours, and for low risk simple procedures only to hold 1 day.

## 2023-05-15 NOTE — Assessment & Plan Note (Signed)
She is having myalgias that she is very deconditioned.  We talked of the importance of try to get some exercise in order to lose weight.

## 2023-05-15 NOTE — Assessment & Plan Note (Signed)
She seems to be maintaining sinus rhythm ever since her thyroid levels were adjusted.  Is now simply on beta-blocker for rate control and Eliquis for stroke prophylaxis with a CHA2DS2-VASc score of 5.  No change.  She will follow-up with the A-fib clinic in October and I will see her back in a year.  I think if we alternate every 6 months between myself and the IV clinic, she will be covered.

## 2023-05-15 NOTE — Assessment & Plan Note (Signed)
Tolerating CPAP well.  Sleeping better.  More energy.

## 2023-05-15 NOTE — Assessment & Plan Note (Signed)
She is also has short little bursts of PAT that were somewhat symptomatic in the past, but not similar to her A-fib.  They do not last as long.  Continue beta-blocker, maintain adequate hydration, discussed vagal maneuvers.

## 2023-05-15 NOTE — Patient Instructions (Addendum)
Medication Instructions:  No changes   *If you need a refill on your cardiac medications before your next appointment, please call your pharmacy*   Lab Work: Not needed    Testing/Procedures:  Not  needed  Follow-Up: At [facility], you and your health needs are our priority.  As part of our continuing mission to provide you with exceptional heart care, we have created designated Provider Care Teams.  These Care Teams include your primary Cardiologist (physician) and Advanced Practice Providers (APPs -  Physician Assistants and Nurse Practitioners) who all work together to provide you with the care you need, when you need it.     The format for your next appointment:   In Person  Provider:   Bryan Lemma, MD

## 2023-05-18 ENCOUNTER — Other Ambulatory Visit (HOSPITAL_COMMUNITY): Payer: Self-pay | Admitting: Family Medicine

## 2023-05-18 DIAGNOSIS — R109 Unspecified abdominal pain: Secondary | ICD-10-CM

## 2023-05-19 ENCOUNTER — Ambulatory Visit (HOSPITAL_COMMUNITY)
Admission: RE | Admit: 2023-05-19 | Discharge: 2023-05-19 | Disposition: A | Discharge status: Home / Self Care | Payer: Medicare Other | Source: Ambulatory Visit | Attending: Family Medicine | Admitting: Family Medicine

## 2023-05-19 DIAGNOSIS — R109 Unspecified abdominal pain: Secondary | ICD-10-CM | POA: Insufficient documentation

## 2023-06-06 ENCOUNTER — Ambulatory Visit: Payer: Medicare Other | Admitting: Cardiovascular Disease

## 2023-06-28 ENCOUNTER — Ambulatory Visit: Payer: Medicare Other | Admitting: Obstetrics and Gynecology

## 2023-07-19 ENCOUNTER — Encounter: Payer: Self-pay | Admitting: Gastroenterology

## 2023-07-19 ENCOUNTER — Ambulatory Visit (INDEPENDENT_AMBULATORY_CARE_PROVIDER_SITE_OTHER): Payer: Medicare Other | Admitting: Gastroenterology

## 2023-07-19 VITALS — BP 130/85 | HR 54 | Temp 97.9°F | Ht 65.0 in | Wt 265.0 lb

## 2023-07-19 DIAGNOSIS — R7989 Other specified abnormal findings of blood chemistry: Secondary | ICD-10-CM | POA: Insufficient documentation

## 2023-07-19 DIAGNOSIS — K219 Gastro-esophageal reflux disease without esophagitis: Secondary | ICD-10-CM | POA: Diagnosis not present

## 2023-07-19 DIAGNOSIS — R1011 Right upper quadrant pain: Secondary | ICD-10-CM

## 2023-07-19 DIAGNOSIS — R109 Unspecified abdominal pain: Secondary | ICD-10-CM

## 2023-07-19 DIAGNOSIS — K900 Celiac disease: Secondary | ICD-10-CM | POA: Insufficient documentation

## 2023-07-19 NOTE — Patient Instructions (Signed)
We will request copy of last colonoscopy and upper endoscopy for our records.  Please complete labs at [facility]. If you can find copy of your last bone density scan that would be helpful.

## 2023-07-19 NOTE — Progress Notes (Signed)
GI Office Note    Referring Provider: Benita Stabile, MD Primary Care Physician:  Benita Stabile, MD  Primary Gastroenterologist: previously evaluated by [facility] (2021)  Chief Complaint   Chief Complaint  Patient presents with   Abdominal Pain    Having some rt side abdominal pain that sometimes radiates to the front. Does not have gallbladder and has had scans of bladder and kidneys to rule out stones. Was prescribed pantoprazole, states it did not work and stopped taking it.      History of Present Illness   Maria Meza is a 67 y.o. female presenting today at the request of Dr. Margo Aye for further evaluation of abdominal pain. Patient previously established with [facility], initially seen in 2018 for Celiac disease and then most recently in 2021 for bloating, GERD, RUQ pain for five years.   Patient states in 2018 her rheumatologist suspected she may have celiac disease, blood work showed significantly elevated TTG IgA >100. She then was referred to [facility] for management. Interestedly, it was at that time that an old endoscopy report and path from 2009 was found that showed celiac disease, which patient states she had never been advised of these findings.  She had years of postprandial vomiting and intermittent abdominal pain and diarrhea that markedly improved within days of going on gluten free diet. She reports her last EGD/colonoscopy was in [place] in 2016.   She is in process of establishing all of her care locally as she is unable to drive out of town for medical care. Recently seen by Dr. Margo Aye for several week history of sharp RUQ pain radiating into the back. Symptoms started the first part of May. Different then back in 2021 when she was seen by [facility]. Pain sharp, constant. On May 23rd, she noted a "spot" of blood in her urine. She thought she may have a kidney stone. She wore a pad in her underwear for about a week and continued to notice a tinge of blood. This has now  resolved. She had renal u/s which was unremarkable. She reports follow up u/a showed no blood in the urine. She was started on pantoprazole June 13, which helped her acid reflux but not the abd pain. She had some nausea sporadically and stopped pantoprazole to see if it was the cause but the nausea persisted. No vomiting. RUQ pain sometimes worse with meals but worse with movement. More severe pain lasting for about 3-4 weeks, currently nagging pain, level 1.    She has very limited heartburn/indigestion. Controls with diet. Uses TUMS prn. No dysphagia. BM regular. No melena, brbpr.   Last bone density study in [facility] [place] several years ago.   Labs 05/2023: A1C 5.7, TSH 1.29, WBC 7400, Hgb 13.2, Platelets 307, glucose 92, cre 0.83, alb 4.5, Tbili 0.5, AP 91, AST 24, ALT 36H  Cardioversion 08/2022 04/2017: TTG IGA >100  EGD 2009 with bx showed celiac disease Last upper endoscopy: 2016 - small hiatal hernia per patient report Last colonoscopy: 2016 - normal, no polyps, repeat rec'd in 10 years per patient report, completed by Dr. Simon Rhein in [place] [facility]  GI Procedures and Studies  EGD/colonoscopy (2009) The esophagus was soft, widely patent without evidence of stricture, esophagitis, and a normal gastroesophageal junction. The stomach was soft, easily distensible, with a normal rugal pattern. Retroflexion demonstrated normal cardiac fundus and cardia. The gastric antrum was remarkable for mild erythema and this was biopsied. The duodenum  had more marked erythema along its mucosal surface and this was also biopsied with the cold biopsy forceps. The second portion of the duodenum was visualized and appeared to be normal with a normal velvety pattern. After hemostasis was ascertained, the scope was withdrawn and the air sucked from the distended stomach.  The patient was then positioned for colonoscopy. The Pentax pediatric colonoscope was introduced into the rectum and advanced to the level of the  cecum with no difficulty. The cecal endpoint was identified by the cecal strap musculature, appendiceal orifice, and ileocecal valve. A detailed evaluation was conducted on withdrawal of the instrument, which demonstrated normal colonic mucosa throughout without evidence of vascular, inflammatory, or neoplastic lesion. Retroflexion in the rectal vault demonstrated no anorectal disease. Air was aspirated on withdrawal of the instrument. The patient tolerated the procedure well and was returned to same day in good condition. Next colonoscopy 10 years. A. Duodenum, biopsy: - Few superficial fragments of small bowel mucosa with increased chronic inflammation, including increased intraepithelial lymphocytes, see comment - Special stain negative for H. pylori organisms B. 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 pylori organisms Comments: The histologic findings in part A would be consistent with celiac disease. Clinical correlation is requested.   Medications   Current Outpatient Medications  Medication Sig Dispense Refill   apixaban (ELIQUIS) 5 MG TABS tablet Take 1 tablet (5 mg total) by mouth 2 (two) times daily. 60 tablet 1   Ascorbic Acid (VITAMIN C) 1000 MG tablet Take 1,000 mg by mouth daily.     b complex vitamins tablet Take by mouth daily.     Cholecalciferol (VITAMIN D3) 1.25 MG (50000 UT) CAPS Take 1 capsule by mouth once a week.     folic acid (FOLVITE) 1 MG tablet Take 2 mg by mouth daily. 2 tablets daily     latanoprost (XALATAN) 0.005 % ophthalmic solution Place 1 drop into both eyes daily.      levothyroxine (SYNTHROID) 150 MCG tablet Take 150 mcg by mouth daily.     magnesium oxide (MAG-OX) 400 MG tablet TAKE 1 TABLET BY MOUTH TWICE A DAY (Patient taking differently: Take 1 tablet by mouth daily.) 180 tablet 3    metoprolol tartrate (LOPRESSOR) 25 MG tablet Take 1 tablet (25 mg total) by mouth 2 (two) times daily. 180 tablet 3   mometasone (ELOCON) 0.1 % lotion Apply 1 application topically.     No current facility-administered medications for this visit.    Allergies   Allergies as of 07/19/2023 - Review Complete 07/19/2023  Allergen Reaction Noted   Amiodarone Other (See Comments) 12/11/2014   Latex Other (See Comments) 09/09/2022   Propafenone  05/10/2017   Gluten meal  08/01/2019   Hydrocodone Nausea And Vomiting 11/24/2014   Propafenone hcl  09/29/2017   Verapamil  10/11/2020    Past Medical History   Past Medical History:  Diagnosis Date   Absolute anemia    No longer on iron supplementation.   Adult celiac disease 2018   Anxiety    Arrhythmia 06/2016   Event monitor has shown PVCs, PSVT at 1 short burst of NSVT.;  Event monitor from March 2021: 6 short runs of wide-complex tachycardia and 25 runs of PSVT--intolerant of high-dose beta-blocker because of bradycardia.   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  (ANA positive, dsDNA negative)   Diastolic CHF (HCC)    Dysmenorrhea    Elevated LFTs    Fatty liver    Glaucoma    History of COVID-19 02/19/2020   IBS (irritable bowel syndrome)    Morbid obesity with BMI of 40.0-44.9, adult (HCC) 09/2020   BMI 43.07   MRSA infection 4-end-21   on abdomen   OSA on CPAP    Pulmonary hypertension (HCC)    PVC's (premature ventricular contractions)    Thyroid disease    hypothyroidism   TIA (transient ischemic attack)    2 per patient   Urinary incontinence    with sneezing, coughing    Past Surgical History   Past Surgical History:  Procedure Laterality Date   CARDIAC EVENT MONITOR  06/2016    [facility]) Noted PVCs, PSVT and NSVT (1 episode of 20 beats). ->  PT evaluation suggested PVCs appear to have been completely interpolated.  Felt to be septal   CARDIAC MRI -ADENOSINE / STRESS  05/28/2020   [facility]): EF 69%. Normal LV size, thickness and function w/ NO RWMA  CO & CI 6.5 L/min & 3 L/min.      Normal RV size thickness and function.  No RV WMA or aneurysm.  No findings c/w  ARVC.  Both atria are mildly enlarged.  Trileaflet aortic valve with no evidence of stenosis.  Adenosine Stress Imaging: no  inducible myocardial ischemia OR prior infarction/scar or infiltrate. Normal AoV. Mid TR.   CARDIOVERSION N/A 09/14/2022   Procedure: CARDIOVERSION;  Surgeon: Jake Bathe, MD;  Location: [facility];  Service: Cardiovascular;  Laterality: N/A;   CATARACT EXTRACTION Right 03/2018   CATARACT EXTRACTION Left 03/2018   CHOLECYSTECTOMY     LAPAROSCOPIC TUBAL LIGATION  1986   NUCLEAR STRESS TEST  05/2014   EF 55%.  Mixed anteroseptal defect consistent with breast attenuation artifact.   TEE WITHOUT CARDIOVERSION N/A 09/14/2022   Procedure: TRANSESOPHAGEAL ECHOCARDIOGRAM (TEE);  Surgeon: Jake Bathe, MD;  Location: [facility];  Service: Cardiovascular;  Laterality: N/A;   TRANSTHORACIC ECHOCARDIOGRAM  08/2016   [facility]- Normal LV size and function-EF~55%.  No RWMA..  Normal LA pressures.  Normal RV function.  Trivial AR, PR and TR.  No stenoses.    TRANSTHORACIC ECHOCARDIOGRAM  02/14/2022   Difficult images.  Normal EF 60 to 65%.  No RWMA.  Normal diastolic parameters.  Normal RV size and function.  Mild to moderate LA dilation.  Normal aortic and mitral valves.  Acing aorta measured roughly 38 mm.   ZIO PATCH CARDIAC EVENT MONITOR  02/2020   Predom Rhytym: SR - min 46 - max 113 bpm, avg 64 bpm. Rare isolated PVC & PACs. 6 short NSVT runs (8-11 beats, 134-245 bpm); 25 runs of  PAT/SVT - fastest 9 beats (200 bpm), longest 10.6 sec (130 bpm). Noted Sx w/ PAT & NSVT during daylight hours, and only once at midnight.    ZIO PATCH MONITOR  01/2022   Predominant SR: Rate range 46-99 bpm, avg 61 bpm.  Rare PACs & PVCs (& rare couplets).  No bigeminy/trigeminy.  (Sx noted w/ PACs and PVCs - & a few Atrial Runs).  35 Atrial Runs + 1 "V. tach "run of 4 Narrow Complex beats; fastest atrial run 5 beats @ 193 bpm & longest 22 beats (  12.1 sec) @ avg 108 bpm.    Past Family History   Family History  Problem Relation Age of Onset   Heart attack Father        dec age 57   Hypertension Father    Hypertension Mother    Hyperlipidemia Mother    Migraines Mother    Seizures Mother        epilepsy   Stroke Mother        TIA's   Thyroid disease Sister        hypothyroid   Thyroid disease Sister        hypothyroid   Rheum arthritis Maternal Grandmother    Migraines Maternal Grandmother    Cancer Maternal Grandfather 25       colon ca--DEC age 24   Diabetes Maternal Grandfather    Stroke Paternal Grandmother        multiple   Thyroid disease Paternal Grandmother        goiter-hypothyroid   Breast cancer Maternal Aunt     Past Social History   Social History   Socioeconomic History   Marital status: Married    Spouse name: Not on file   Number of children: Not on file   Years of education: Not on file   Highest education level: Not on file  Occupational History   Not on file  Tobacco Use   Smoking status: Never   Smokeless tobacco: Never   Tobacco comments:    Never smoke 11/28/22  Vaping Use   Vaping status: Never Used  Substance and Sexual Activity   Alcohol use: No    Alcohol/week: 0.0 standard drinks of alcohol   Drug use: No   Sexual activity: Not Currently    Partners: Male    Birth control/protection: Post-menopausal  Other Topics Concern   Not on file  Social History Narrative   She lives in [place], [place]   Right handed    Caffeine use: 1 cup coffee every morning   Social Determinants of Health   Financial Resource Strain: Not on file  Food Insecurity: No Food Insecurity  (09/10/2022)   Hunger Vital Sign    Worried About Running Out of Food in the Last Year: Never true    Ran Out of Food in the Last Year: Never true  Transportation Needs: No Transportation Needs (09/10/2022)   PRAPARE - Administrator, Civil Service (Medical): No    Lack of Transportation (Non-Medical): No  Physical Activity: Not on file  Stress: Not on file  Social Connections: Not on file  Intimate Partner Violence: Not At Risk (09/10/2022)   Humiliation, Afraid, Rape, and Kick questionnaire    Fear of Current or Ex-Partner: No    Emotionally Abused: No    Physically Abused: No    Sexually Abused: No    Review of Systems   General: Negative for anorexia, weight loss, fever, chills, fatigue, weakness. Eyes: Negative for vision changes.  ENT: Negative for hoarseness, difficulty swallowing , nasal congestion. CV: Negative for chest pain, angina, palpitations, dyspnea on exertion, peripheral edema.  Respiratory: Negative for dyspnea at rest, dyspnea on exertion, cough, sputum, wheezing.  GI: See history of present illness. GU:  Negative for dysuria,  urinary incontinence, urinary frequency, nocturnal urination. See hpi MS:  Positive MS pain.  Derm: Negative for rash or itching.  Neuro: Negative for weakness, abnormal sensation, seizure, frequent headaches, memory loss,  confusion.  Psych: Negative for anxiety, depression, suicidal ideation, hallucinations.  Endo: Negative for unusual weight change.  Heme: Negative for bruising or bleeding. Allergy: Negative for rash or hives.  Physical Exam   BP 130/85 (BP Location: Right Arm, Patient Position: Sitting, Cuff Size: Large)   Pulse (!) 54   Temp 97.9 F (36.6 C) (Oral)   Ht 5\' 5"  (1.651 m)   Wt 265 lb (120.2 kg)   LMP 04/16/2016 (Exact Date)   SpO2 98%   BMI 44.10 kg/m    General: Well-nourished, well-developed in no acute distress.  Head: Normocephalic, atraumatic.   Eyes: Conjunctiva pink, no icterus. Mouth:  Oropharyngeal mucosa moist and pink  Neck: Supple without thyromegaly, masses, or lymphadenopathy.  Lungs: Clear to auscultation bilaterally.  Heart: Regular rate and rhythm, no murmurs rubs or gallops.  Abdomen: Bowel sounds are normal, nondistended, no hepatosplenomegaly or masses, no abdominal bruits or hernia, no rebound or guarding.  Mild tenderness ruq. Rectal: not performed Extremities: No lower extremity edema. No clubbing or deformities.  Neuro: Alert and oriented x 4 , grossly normal neurologically.  Skin: Warm and dry, no rash or jaundice.   Psych: Alert and cooperative, normal mood and affect.  Labs   See hpi  Imaging Studies   No results found.  Assessment   *RUQ/right flank pain *Celiac Disease *GERD *Elevated LFTs   Celiac disease with positive biopsies in 2009 but patient states she was never made aware. Rheumatologist screened her for celiac disease in 2018, TTG IgA >100. She started on gluten free diet and had marked improvement in chronic GI symptoms at that time. She maintains gluten free diet. She unfortunately has never had routine follow up for her disease. No labs in years regarding celiac. Last EGD 2016. Last bone density she reports several years ago and "ok" then.   Reflux is well controlled with diet.   RUQ/right flank pain possibly multifactorial, worse with movement but also worse with meals. Also concern for gross hematuria, renal u/s with no stones in the kidneys/bladder but not sure if this would have excluded possibility of small stone within ureters, at least no evidence of hydronephrosis. Abdominal pain improved but persistent.   Mildly elevated ALT noted. Possible due to celiac disease. Biliary etiology not excluded. She is s/p cholecystectomy, less likely of having retained CBD stone but not ruled out. Limited lab work up planned. Consider imaging at later date. She may ultimately require CT to evaluate her abdominal pain and could assess for  biliary dilation and the liver at that time.   PLAN   Request last EGD/colonoscopy reports.  Complete labs.  She will look for bone density study report if available.  Await labs, she may require further imaging to evaluate her abdominal and/or elevated LFTs. She may require updated EGD in the near future for small bowel biopsies to reassess celiac disease.    Leanna Battles. Melvyn Neth, MHS, PA-C [facility]

## 2023-07-20 ENCOUNTER — Encounter: Payer: Self-pay | Admitting: Gastroenterology

## 2023-07-20 LAB — IRON,TIBC AND FERRITIN PANEL
Iron Saturation: 30 % (ref 15–55)
Iron: 104 ug/dL (ref 27–139)
Total Iron Binding Capacity: 352 ug/dL (ref 250–450)
UIBC: 248 ug/dL (ref 118–369)

## 2023-07-20 LAB — HEPATIC FUNCTION PANEL
ALT: 40 IU/L — ABNORMAL HIGH (ref 0–32)
AST: 28 IU/L (ref 0–40)
Alkaline Phosphatase: 95 IU/L (ref 44–121)
Bilirubin Total: 0.4 mg/dL (ref 0.0–1.2)
Bilirubin, Direct: 0.11 mg/dL (ref 0.00–0.40)
Total Protein: 6.9 g/dL (ref 6.0–8.5)

## 2023-07-20 LAB — PROTIME-INR
INR: 0.9 (ref 0.9–1.2)
Prothrombin Time: 10.2 s (ref 9.1–12.0)

## 2023-07-20 LAB — SEDIMENTATION RATE: Sed Rate: 3 mm/hr (ref 0–40)

## 2023-07-20 LAB — TISSUE TRANSGLUTAMINASE, IGA

## 2023-07-20 LAB — VITAMIN D 25 HYDROXY (VIT D DEFICIENCY, FRACTURES): Vit D, 25-Hydroxy: 75.3 ng/mL (ref 30.0–100.0)

## 2023-07-20 LAB — FOLATE: Folate: >20 ng/mL (ref >3.0)

## 2023-07-20 LAB — HEPATITIS B SURFACE ANTIGEN: Hepatitis B Surface Ag: NEGATIVE

## 2023-07-20 LAB — HEPATITIS C ANTIBODY: Hep C Virus Ab: NONREACTIVE

## 2023-07-20 LAB — C-REACTIVE PROTEIN: CRP: 2 mg/L (ref 0–10)

## 2023-07-24 ENCOUNTER — Other Ambulatory Visit: Payer: Self-pay | Admitting: *Deleted

## 2023-07-24 ENCOUNTER — Encounter: Payer: Self-pay | Admitting: *Deleted

## 2023-07-24 DIAGNOSIS — Z09 Encounter for follow-up examination after completed treatment for conditions other than malignant neoplasm: Secondary | ICD-10-CM

## 2023-07-24 DIAGNOSIS — K900 Celiac disease: Secondary | ICD-10-CM

## 2023-07-24 DIAGNOSIS — K9041 Non-celiac gluten sensitivity: Secondary | ICD-10-CM

## 2023-07-24 NOTE — Addendum Note (Signed)
Addended by: Elinor Dodge on: 07/24/2023 11:49 AM   Modules accepted: Orders

## 2023-08-04 ENCOUNTER — Other Ambulatory Visit (HOSPITAL_COMMUNITY): Payer: Medicare Other

## 2023-08-04 ENCOUNTER — Telehealth: Payer: Self-pay | Admitting: *Deleted

## 2023-08-04 ENCOUNTER — Telehealth: Payer: Self-pay

## 2023-08-04 NOTE — Telephone Encounter (Signed)
  Request for patient to stop medication prior to procedure or is needing cleareance  08/04/23  Maria Meza 05-02-1956  What type of surgery is being performed? Esophagogastroduodenoscopy (EGD)  When is surgery scheduled? TBD  What type of clearance is required (medical or pharmacy to hold medication or both? medication  Are there any medications that need to be held prior to surgery and how long? Eliquis x 2 days  Name of physician performing surgery?  Dr.Rourk [facility] at [employer]: [phone] Fax: [phone]  Anethesia type (none, local, MAC, general)? MAC

## 2023-08-04 NOTE — Telephone Encounter (Signed)
   Name: Maria Meza  DOB: March 06, 1956  MRN: [id number]  Primary Cardiologist: Bryan Lemma, MD   Preoperative team, please contact this patient and set up a phone call appointment for further preoperative risk assessment. Please obtain consent and complete medication review. Thank you for your help. Last seen 05/15/2023   I confirm that guidance regarding antiplatelet and oral anticoagulation therapy has been completed and, if necessary, noted below.  Per office protocol, patient can hold Eliquis for 2 days prior to procedure as requested.     Joni Reining, NP 08/04/2023, 3:59 PM [facility]

## 2023-08-04 NOTE — Telephone Encounter (Signed)
  Patient Consent for Virtual Visit         Maria Meza has provided verbal consent on 08/04/2023 for a virtual visit (video or telephone).   CONSENT FOR VIRTUAL VISIT FOR:  Maria Meza  By participating in this virtual visit I agree to the following:  I hereby voluntarily request, consent and authorize [facility] and its employed or contracted physicians, physician assistants, nurse practitioners or other licensed health care professionals (the Practitioner), to provide me with telemedicine health care services (the "Services") as deemed necessary by the treating Practitioner. I acknowledge and consent to receive the Services by the Practitioner via telemedicine. I understand that the telemedicine visit will involve communicating with the Practitioner through live audiovisual communication technology and the disclosure of certain medical information by electronic transmission. I acknowledge that I have been given the opportunity to request an in-person assessment or other available alternative prior to the telemedicine visit and am voluntarily participating in the telemedicine visit.  I understand that I have the right to withhold or withdraw my consent to the use of telemedicine in the course of my care at any time, without affecting my right to future care or treatment, and that the Practitioner or I may terminate the telemedicine visit at any time. I understand that I have the right to inspect all information obtained and/or recorded in the course of the telemedicine visit and may receive copies of available information for a reasonable fee.  I understand that some of the potential risks of receiving the Services via telemedicine include:  Delay or interruption in medical evaluation due to technological equipment failure or disruption; Information transmitted may not be sufficient (e.g. poor resolution of images) to allow for appropriate medical decision making by the  Practitioner; and/or  In rare instances, security protocols could fail, causing a breach of personal health information.  Furthermore, I acknowledge that it is my responsibility to provide information about my medical history, conditions and care that is complete and accurate to the best of my ability. I acknowledge that Practitioner's advice, recommendations, and/or decision may be based on factors not within their control, such as incomplete or inaccurate data provided by me or distortions of diagnostic images or specimens that may result from electronic transmissions. I understand that the practice of medicine is not an exact science and that Practitioner makes no warranties or guarantees regarding treatment outcomes. I acknowledge that a copy of this consent can be made available to me via my patient portal [facility] MyChart), or I can request a printed copy by calling the office of [facility].    I understand that my insurance will be billed for this visit.   I have read or had this consent read to me. I understand the contents of this consent, which adequately explains the benefits and risks of the Services being provided via telemedicine.  I have been provided ample opportunity to ask questions regarding this consent and the Services and have had my questions answered to my satisfaction. I give my informed consent for the services to be provided through the use of telemedicine in my medical care

## 2023-08-04 NOTE — Telephone Encounter (Signed)
Pharmacy please advise on holding Eliquis prior to EDG scheduled for TBD. Thank you.

## 2023-08-04 NOTE — Telephone Encounter (Signed)
Patient with diagnosis of afib on Eliquis for anticoagulation.    Procedure: EGD Date of procedure: TBD  CHA2DS2-VASc Score = 5  This indicates a 7.2% annual risk of stroke. The patient's score is based upon: CHF History: 0 HTN History: 1 Diabetes History: 0 Stroke History: 2 Vascular Disease History: 0 Age Score: 1 Gender Score: 1   CrCl 49mL/min using adjusted body weight Platelet count 302K  Per office protocol, patient can hold Eliquis for 2 days prior to procedure as requested.    **This guidance is not considered finalized until pre-operative APP has relayed final recommendations.**

## 2023-08-04 NOTE — Telephone Encounter (Signed)
Pt scheduled for tele visit on 08/16/23. Med rec and consent done

## 2023-08-16 ENCOUNTER — Ambulatory Visit: Payer: Medicare Other | Attending: Internal Medicine

## 2023-08-16 DIAGNOSIS — Z01810 Encounter for preprocedural cardiovascular examination: Secondary | ICD-10-CM | POA: Diagnosis not present

## 2023-08-16 NOTE — Progress Notes (Signed)
Virtual Visit via Telephone Note   Because of Maria Meza's co-morbid illnesses, she is at least at moderate risk for complications without adequate follow up.  This format is felt to be most appropriate for this patient at this time.  The patient did not have access to video technology/had technical difficulties with video requiring transitioning to audio format only (telephone).  All issues noted in this document were discussed and addressed.  No physical exam could be performed with this format.  Please refer to the patient's chart for her consent to telehealth for [facility].  Evaluation Performed:  Preoperative cardiovascular risk assessment _____________   Date:  08/16/2023   Patient ID:  Maria Meza, DOB 1956/05/09, MRN [id number] Patient Location:  Home Provider location:   Office  Primary Care Provider:  Benita Stabile, MD Primary Cardiologist:  Bryan Lemma, MD  Chief Complaint / Patient Profile   67 y.o. y/o female with a h/o persistent AF (on Eliquis), PSVT, OSA (on CPAP), obesity, hypothyroidism who is pending upper endoscopy and presents today for telephonic preoperative cardiovascular risk assessment.  History of Present Illness    Maria Meza is a 67 y.o. female who presents via audio/video conferencing for a telehealth visit today.  Pt was last seen in cardiology clinic on 05/15/2023 by Dr. Herbie Baltimore.  At that time Maria Meza was doing well no new cardiac complaints and short episode of skipped beats but no recurrence of AF. The patient is now pending procedure as outlined above. Since her last visit, she has been doing well with no new cardiac complaints since previous follow-up.  She reports occasional palpitations but denies any sustained arrhythmias.  She denies chest pain, shortness of breath, lower extremity edema, fatigue, melena, hematuria, hemoptysis, diaphoresis, weakness, presyncope, syncope, orthopnea, and PND.    Past Medical  History    Past Medical History:  Diagnosis Date   Absolute anemia    No longer on iron supplementation.   Adult celiac disease 2018   Anxiety    Arrhythmia 06/2016   Event monitor has shown PVCs, PSVT at 1 short burst of NSVT.;  Event monitor from March 2021: 6 short runs of wide-complex tachycardia and 25 runs of PSVT--intolerant of high-dose beta-blocker because of bradycardia.   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  (ANA positive, dsDNA negative)   Diastolic CHF (HCC)    Dysmenorrhea    Elevated LFTs    Fatty liver    Glaucoma    History of COVID-19 02/19/2020   IBS (irritable bowel syndrome)    Morbid obesity with BMI of 40.0-44.9, adult (HCC) 09/2020   BMI 43.07   MRSA infection 4-end-21   on abdomen   OSA on CPAP    Pulmonary hypertension (HCC)    PVC's (premature ventricular contractions)    Thyroid disease    hypothyroidism   TIA (transient ischemic attack)    2 per patient   Urinary incontinence    with sneezing, coughing   Past Surgical History:  Procedure Laterality Date   CARDIAC EVENT MONITOR  06/2016   [facility]) Noted PVCs, PSVT and NSVT (1 episode of 20 beats). ->  PT evaluation suggested PVCs appear to have been completely interpolated.  Felt to be septal  CARDIAC MRI -ADENOSINE / STRESS  05/28/2020   [facility]): EF 69%. Normal LV size, thickness and function w/ NO RWMA  CO & CI 6.5 L/min & 3 L/min.      Normal RV size thickness and function.  No RV WMA or aneurysm.  No findings c/w  ARVC.  Both atria are mildly enlarged.  Trileaflet aortic valve with no evidence of stenosis.  Adenosine Stress Imaging: no  inducible myocardial ischemia OR prior infarction/scar or infiltrate. Normal AoV. Mid TR.   CARDIOVERSION N/A 09/14/2022    Procedure: CARDIOVERSION;  Surgeon: Jake Bathe, MD;  Location: [facility];  Service: Cardiovascular;  Laterality: N/A;   CATARACT EXTRACTION Right 03/2018   CATARACT EXTRACTION Left 03/2018   CHOLECYSTECTOMY     LAPAROSCOPIC TUBAL LIGATION  1986   NUCLEAR STRESS TEST  05/2014   EF 55%.  Mixed anteroseptal defect consistent with breast attenuation artifact.   TEE WITHOUT CARDIOVERSION N/A 09/14/2022   Procedure: TRANSESOPHAGEAL ECHOCARDIOGRAM (TEE);  Surgeon: Jake Bathe, MD;  Location: [facility];  Service: Cardiovascular;  Laterality: N/A;   TRANSTHORACIC ECHOCARDIOGRAM  08/2016   [facility]- Normal LV size and function-EF~55%.  No RWMA..  Normal LA pressures.  Normal RV function.  Trivial AR, PR and TR.  No stenoses.    TRANSTHORACIC ECHOCARDIOGRAM  02/14/2022   Difficult images.  Normal EF 60 to 65%.  No RWMA.  Normal diastolic parameters.  Normal RV size and function.  Mild to moderate LA dilation.  Normal aortic and mitral valves.  Acing aorta measured roughly 38 mm.   ZIO PATCH CARDIAC EVENT MONITOR  02/2020   Predom Rhytym: SR - min 46 - max 113 bpm, avg 64 bpm. Rare isolated PVC & PACs. 6 short NSVT runs (8-11 beats, 134-245 bpm); 25 runs of  PAT/SVT - fastest 9 beats (200 bpm), longest 10.6 sec (130 bpm). Noted Sx w/ PAT & NSVT during daylight hours, and only once at midnight.   ZIO PATCH MONITOR  01/2022   Predominant SR: Rate range 46-99 bpm, avg 61 bpm.  Rare PACs & PVCs (& rare couplets).  No bigeminy/trigeminy.  (Sx noted w/ PACs and PVCs - & a few Atrial Runs).  35 Atrial Runs + 1 "V. tach "run of 4 Narrow Complex beats; fastest atrial run 5 beats @ 193 bpm & longest 22 beats (12.1 sec) @ avg 108 bpm.    Allergies  Allergies  Allergen Reactions   Amiodarone Other (See Comments)    "does not tolerate"--slows heart rate   Latex Other (See Comments)    blisters   Propafenone     Other Reaction(s): Other (See Comments)  Heart felt like it was going to stop; med did not work well.     Caused very low HR   Gluten Meal     Pt has celiac disease.    Hydrocodone Nausea And Vomiting   Propafenone Hcl     Caused very low HR   Verapamil     Fatigue, constipation    Home Medications    Prior to Admission medications   Medication Sig Start Date End Date Taking? Authorizing Provider  apixaban (ELIQUIS) 5 MG TABS tablet Take 1 tablet (5 mg total) by mouth 2 (two) times daily. 09/15/22   Catarina Hartshorn, MD  Ascorbic Acid (VITAMIN C) 1000 MG tablet Take 1,000 mg by mouth daily.    [provider]  b complex vitamins tablet Take by mouth daily.    [provider]  Cholecalciferol (VITAMIN D3) 1.25 MG (50000 UT) CAPS Take 1 capsule by mouth once a week. 04/17/23   [provider]  folic acid (FOLVITE) 1 MG tablet Take 2 mg by mouth daily. 2 tablets daily 04/21/11   [provider]  latanoprost (XALATAN) 0.005 % ophthalmic solution Place 1 drop into both eyes daily.     [provider]  levothyroxine (SYNTHROID) 150 MCG tablet Take 150 mcg by mouth daily. 02/03/23   [provider]  magnesium oxide (MAG-OX) 400 MG tablet TAKE 1 TABLET BY MOUTH TWICE A DAY Patient taking differently: Take 1 tablet by mouth daily. 10/03/22   Marykay Lex, MD  metoprolol tartrate (LOPRESSOR) 25 MG tablet Take 1 tablet (25 mg total) by mouth 2 (two) times daily. 10/07/22   Ronney Asters, NP  mometasone (ELOCON) 0.1 % lotion Apply 1 application topically.    [provider]    Physical Exam    Vital Signs:  Maria Meza does not have vital signs available for review today.  Given telephonic nature of communication, physical exam is limited. AAOx3. NAD. Normal affect.  Speech and respirations are unlabored.  Accessory Clinical Findings    None  Assessment & Plan    1.  Preoperative Cardiovascular Risk Assessment: -Patient's RCRI score is 0.4%  The patient affirms she has been doing well without any new cardiac symptoms. They  are able to achieve 7 METS without cardiac limitations. Therefore, based on ACC/AHA guidelines, the patient would be at acceptable risk for the planned procedure without further cardiovascular testing. The patient was advised that if she develops new symptoms prior to surgery to contact our office to arrange for a follow-up visit, and she verbalized understanding.   The patient was advised that if she develops new symptoms prior to surgery to contact our office to arrange for a follow-up visit, and she verbalized understanding.  Patient can hold Eliquis 2 days prior to procedure and should restart postprocedure when surgically safe.  A copy of this note will be routed to requesting surgeon.  Time:   Today, I have spent 6 minutes with the patient with telehealth technology discussing medical history, symptoms, and management plan.     Napoleon Form, Leodis Rains, NP  08/16/2023, 7:51 AM

## 2023-08-21 ENCOUNTER — Other Ambulatory Visit: Payer: Self-pay | Admitting: Obstetrics and Gynecology

## 2023-08-21 DIAGNOSIS — Z1231 Encounter for screening mammogram for malignant neoplasm of breast: Secondary | ICD-10-CM

## 2023-08-22 ENCOUNTER — Other Ambulatory Visit (HOSPITAL_COMMUNITY): Payer: Medicare Other

## 2023-08-22 NOTE — Telephone Encounter (Signed)
Pt had a tele visit on 08/16/23 with cardiology. Please advise. Thank you

## 2023-08-22 NOTE — Telephone Encounter (Signed)
Ok to schedule EGD with R[facility]k. ASA 3. Hold eliquis 48 hours.

## 2023-08-23 NOTE — Telephone Encounter (Signed)
Will schedule once we get providers Oct schedule.

## 2023-08-30 ENCOUNTER — Ambulatory Visit (HOSPITAL_COMMUNITY)
Admission: RE | Admit: 2023-08-30 | Discharge: 2023-08-30 | Disposition: A | Discharge status: Home / Self Care | Payer: Medicare Other | Source: Ambulatory Visit | Attending: Gastroenterology | Admitting: Gastroenterology

## 2023-08-30 DIAGNOSIS — Z09 Encounter for follow-up examination after completed treatment for conditions other than malignant neoplasm: Secondary | ICD-10-CM | POA: Diagnosis present

## 2023-08-30 DIAGNOSIS — Z780 Asymptomatic menopausal state: Secondary | ICD-10-CM | POA: Diagnosis not present

## 2023-08-30 DIAGNOSIS — K900 Celiac disease: Secondary | ICD-10-CM | POA: Insufficient documentation

## 2023-08-30 DIAGNOSIS — E559 Vitamin D deficiency, unspecified: Secondary | ICD-10-CM | POA: Diagnosis not present

## 2023-08-30 DIAGNOSIS — K9041 Non-celiac gluten sensitivity: Secondary | ICD-10-CM | POA: Insufficient documentation

## 2023-08-30 DIAGNOSIS — M85851 Other specified disorders of bone density and structure, right thigh: Secondary | ICD-10-CM | POA: Insufficient documentation

## 2023-09-08 NOTE — Telephone Encounter (Signed)
Spoke with pt. Scheduled for 10/2. Aware will let her know when pre-op is scheduled. Instructions sent via mychart.

## 2023-09-10 ENCOUNTER — Telehealth: Payer: Self-pay | Admitting: Gastroenterology

## 2023-09-10 NOTE — Telephone Encounter (Signed)
Received records from Dr. Hal Neer from 08/2015.  EGD/colonoscopy 08/2015 showed hiatal hernia but otherwise normal.  Darl Pikes, please NIC for colonoscopy in 08/2015.

## 2023-09-11 ENCOUNTER — Encounter: Payer: Self-pay | Admitting: *Deleted

## 2023-09-18 ENCOUNTER — Telehealth: Payer: Self-pay | Admitting: *Deleted

## 2023-09-18 ENCOUNTER — Encounter: Payer: Self-pay | Admitting: *Deleted

## 2023-09-18 NOTE — Telephone Encounter (Signed)
-----   Message from Anabel Bene sent at 09/18/2023 10:42 AM EDT ----- She called PAT & said she wants to reschedule then called back to say she wants to cancel.  I have placed her in the depot.  Thanks,

## 2023-09-22 ENCOUNTER — Encounter (HOSPITAL_COMMUNITY): Admission: RE | Admit: 2023-09-22 | Payer: Medicare Other | Source: Ambulatory Visit

## 2023-10-02 NOTE — Progress Notes (Signed)
67 y.o. G29P2002 Married Caucasian female here for annual exam.    Notices a bulge in her belly button when she lifts something heavy.  Comes and goes.  Has atrial fibrillation.  On Eliquis. Had Covid.  Thyroid imbalance:  hyper and hypothyroid.  Noticed blood in her urine in march.  Saw PCP.  Had [place] of kidneys and bladder in 05/19/23, and it was normal.  Told her urinalysis was normal.  Sometimes has cramping in her lower midabdominal cramping.   PCP:  Nita Sells, MD   Patient's last menstrual period was 04/16/2016 (exact date).           Sexually active: No.  The current method of family planning is post menopausal status.    Exercising: No.   Smoker:  no  Health Maintenance: Pap:  06/25/21 neg, 06/04/19 neg: HR HPV neg History of abnormal Pap:  no MMG:  scheduled 10/16/23 afternoon, 09/30/22 Breast Density Cat B, BI-RADS CAT 1 neg Colonoscopy:  03/2015 normal BMD:   08/30/23  Result  osteopenic.  Major fracture risk is 8.2%, hip fracture risk is 0.9%.  TDaP:  06/25/21 Gardasil:   no HIV: 09/10/22 NR Hep C: 07/19/23 NR Screening Labs:  PCP and cardiology   reports that she has never smoked. She has never used smokeless tobacco. She reports that she does not drink alcohol and does not use drugs.  Past Medical History:  Diagnosis Date   Absolute anemia    No longer on iron supplementation.   Adult celiac disease 2018   Anxiety    Arrhythmia 06/2016   Event monitor has shown PVCs, PSVT at 1 short burst of NSVT.;  Event monitor from March 2021: 6 short runs of wide-complex tachycardia and 25 runs of PSVT--intolerant of high-dose beta-blocker because of bradycardia.   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  (ANA positive, dsDNA negative)   Diastolic CHF  (HCC)    Dysmenorrhea    Elevated LFTs    Fatty liver    Glaucoma    History of COVID-19 02/19/2020   IBS (irritable bowel syndrome)    Morbid obesity with BMI of 40.0-44.9, adult (HCC) 09/2020   BMI 43.07   MRSA infection 4-end-21   on abdomen   OSA on CPAP    Pulmonary hypertension (HCC)    PVC's (premature ventricular contractions)    Thyroid disease    hypothyroidism   TIA (transient ischemic attack)    2 per patient   Urinary incontinence    with sneezing, coughing    Past Surgical History:  Procedure Laterality Date   CARDIAC EVENT MONITOR  06/2016   [facility]) Noted PVCs, PSVT and NSVT (1 episode of 20 beats). ->  PT evaluation suggested PVCs appear to have been completely interpolated.  Felt to be septal   CARDIAC MRI -ADENOSINE / STRESS  05/28/2020   [facility]): EF 69%. Normal LV size, thickness and function w/ NO RWMA  CO & CI 6.5 L/min & 3 L/min.      Normal RV size thickness and function.  No RV WMA or aneurysm.  No findings c/w  ARVC.  Both atria are mildly enlarged.  Trileaflet aortic valve with no evidence of stenosis.  Adenosine Stress Imaging: no  inducible myocardial ischemia OR prior infarction/scar  or infiltrate. Normal AoV. Mid TR.   CARDIOVERSION N/A 09/14/2022   Procedure: CARDIOVERSION;  Surgeon: Jake Bathe, MD;  Location: [facility];  Service: Cardiovascular;  Laterality: N/A;   CATARACT EXTRACTION Right 03/2018   CATARACT EXTRACTION Left 03/2018   CHOLECYSTECTOMY     LAPAROSCOPIC TUBAL LIGATION  1986   NUCLEAR STRESS TEST  05/2014   EF 55%.  Mixed anteroseptal defect consistent with breast attenuation artifact.   TEE WITHOUT CARDIOVERSION N/A 09/14/2022   Procedure: TRANSESOPHAGEAL ECHOCARDIOGRAM (TEE);  Surgeon: Jake Bathe, MD;  Location: [facility];  Service: Cardiovascular;  Laterality: N/A;   TRANSTHORACIC ECHOCARDIOGRAM  08/2016   [facility]- Normal LV size and function-EF~55%.  No RWMA..  Normal LA pressures.  Normal RV function.  Trivial AR, PR and TR.  No  stenoses.    TRANSTHORACIC ECHOCARDIOGRAM  02/14/2022   Difficult images.  Normal EF 60 to 65%.  No RWMA.  Normal diastolic parameters.  Normal RV size and function.  Mild to moderate LA dilation.  Normal aortic and mitral valves.  Acing aorta measured roughly 38 mm.   ZIO PATCH CARDIAC EVENT MONITOR  02/2020   Predom Rhytym: SR - min 46 - max 113 bpm, avg 64 bpm. Rare isolated PVC & PACs. 6 short NSVT runs (8-11 beats, 134-245 bpm); 25 runs of  PAT/SVT - fastest 9 beats (200 bpm), longest 10.6 sec (130 bpm). Noted Sx w/ PAT & NSVT during daylight hours, and only once at midnight.   ZIO PATCH MONITOR  01/2022   Predominant SR: Rate range 46-99 bpm, avg 61 bpm.  Rare PACs & PVCs (& rare couplets).  No bigeminy/trigeminy.  (Sx noted w/ PACs and PVCs - & a few Atrial Runs).  35 Atrial Runs + 1 "V. tach "run of 4 Narrow Complex beats; fastest atrial run 5 beats @ 193 bpm & longest 22 beats (12.1 sec) @ avg 108 bpm.    Current Outpatient Medications  Medication Sig Dispense Refill   apixaban (ELIQUIS) 5 MG TABS tablet Take 1 tablet (5 mg total) by mouth 2 (two) times daily. 60 tablet 1   Ascorbic Acid (VITAMIN C) 1000 MG tablet Take 1,000 mg by mouth daily.     b complex vitamins tablet Take by mouth daily.     Cholecalciferol (VITAMIN D3) 1.25 MG (50000 UT) CAPS Take 1 capsule by mouth once a week.     folic acid (FOLVITE) 1 MG tablet Take 2 mg by mouth daily. 2 tablets daily     latanoprost (XALATAN) 0.005 % ophthalmic solution Place 1 drop into both eyes daily.      levothyroxine (SYNTHROID) 137 MCG tablet Take 137 mcg by mouth daily.     magnesium oxide (MAG-OX) 400 MG tablet TAKE 1 TABLET BY MOUTH TWICE A DAY (Patient taking differently: Take 1 tablet by mouth daily.) 180 tablet 3   metoprolol tartrate (LOPRESSOR) 25 MG tablet Take 1 tablet (25 mg total) by mouth 2 (two) times daily. 180 tablet 3   mometasone (ELOCON) 0.1 % lotion Apply 1 application topically.     No current  facility-administered medications for this visit.    Family History  Problem Relation Age of Onset   Heart attack Father        dec age 45   Hypertension Father    Hypertension Mother    Hyperlipidemia Mother    Migraines Mother    Seizures Mother        epilepsy   Stroke Mother  TIA's   Thyroid disease Sister        hypothyroid   Thyroid disease Sister        hypothyroid   Rheum arthritis Maternal Grandmother    Migraines Maternal Grandmother    Cancer Maternal Grandfather 71       colon ca--DEC age 19   Diabetes Maternal Grandfather    Stroke Paternal Grandmother        multiple   Thyroid disease Paternal Grandmother        goiter-hypothyroid   Breast cancer Maternal Aunt     Review of Systems  All other systems reviewed and are negative.   Exam:   BP 128/84 (BP Location: Left Arm, Patient Position: Sitting, Cuff Size: Large)   Pulse (!) 57   Ht 5' 5.5" (1.664 m)   Wt 268 lb (121.6 kg)   LMP 04/16/2016 (Exact Date)   SpO2 97%   BMI 43.92 kg/m     General appearance: alert, cooperative and appears stated age Head: normocephalic, without obvious abnormality, atraumatic Neck: no adenopathy, supple, symmetrical, trachea midline and thyroid normal to inspection and palpation Lungs: clear to auscultation bilaterally Breasts: normal appearance, no masses or tenderness, bilateral nipple inversion, left more than right, No nipple discharge or bleeding, No axillary adenopathy Heart: regular rate and rhythm Abdomen: soft, non-tender; no masses, no organomegaly.  Small umbilical hernia noted.  Extremities: extremities normal, atraumatic, no cyanosis or edema Skin: skin color, texture, turgor normal. No rashes or lesions Lymph nodes: cervical, supraclavicular, and axillary nodes normal. Neurologic: grossly normal  Pelvic: External genitalia:  no lesions              No abnormal inguinal nodes palpated.              Urethra:  normal appearing urethra with no  masses, tenderness or lesions              Bartholins and Skenes: normal                 Vagina: normal appearing vagina with normal color and discharge, no lesions              Cervix: no lesions              Pap taken: yes Bimanual Exam:  Uterus:  normal size, contour, position, consistency, mobility, non-tender              Adnexa: no mass, fullness, tenderness              Rectal exam: yes.  Confirms.              Anus:  normal sphincter tone, no lesions  Chaperone was present for exam:  Warren Lacy, CMA  Assessment:   Encounter for breast and pelvic exam.  Cervical cancer screening.  Bleeding from an unknown site.  Started menopause late. Bilateral nipple inversion.  Umbilical hernia.  Connective tissue disorder.  Off medication. Celiac disease. Hx TIA. Thyroid disease.  Atrial fibrillation.  On Eliquis.   Plan: Mammogram screening discussed. Self breast awareness reviewed. Pap and HR HPV as above. Guidelines for Calcium, Vitamin D, regular exercise program including cardiovascular and weight bearing exercise. BMD reviewed.  Repeat in 2026.  Will proceed with pelvic US.  I educated her to seek emergency care if her umbilical hernia becomes enlarged and painful.   Follow up annually and prn.   25 min  total time was spent for this patient encounter, including preparation,  face-to-face counseling with the patient, coordination of care, and documentation of the encounter in addition to doing her breast and pelvic exam.

## 2023-10-11 ENCOUNTER — Ambulatory Visit (HOSPITAL_COMMUNITY)
Admission: RE | Admit: 2023-10-11 | Discharge: 2023-10-11 | Disposition: A | Discharge status: Home / Self Care | Payer: Medicare Other | Source: Ambulatory Visit | Attending: Physician Assistant | Admitting: Physician Assistant

## 2023-10-11 ENCOUNTER — Encounter (HOSPITAL_COMMUNITY): Payer: Self-pay | Admitting: Physician Assistant

## 2023-10-11 VITALS — BP 118/78 | HR 53 | Ht 65.0 in | Wt 266.4 lb

## 2023-10-11 DIAGNOSIS — Z6841 Body Mass Index (BMI) 40.0 and over, adult: Secondary | ICD-10-CM | POA: Diagnosis not present

## 2023-10-11 DIAGNOSIS — E669 Obesity, unspecified: Secondary | ICD-10-CM | POA: Diagnosis not present

## 2023-10-11 DIAGNOSIS — G4733 Obstructive sleep apnea (adult) (pediatric): Secondary | ICD-10-CM | POA: Diagnosis not present

## 2023-10-11 DIAGNOSIS — I5032 Chronic diastolic (congestive) heart failure: Secondary | ICD-10-CM | POA: Insufficient documentation

## 2023-10-11 DIAGNOSIS — I4891 Unspecified atrial fibrillation: Secondary | ICD-10-CM | POA: Diagnosis present

## 2023-10-11 DIAGNOSIS — I4819 Other persistent atrial fibrillation: Secondary | ICD-10-CM | POA: Insufficient documentation

## 2023-10-11 DIAGNOSIS — D6869 Other thrombophilia: Secondary | ICD-10-CM | POA: Insufficient documentation

## 2023-10-11 DIAGNOSIS — R9431 Abnormal electrocardiogram [ECG] [EKG]: Secondary | ICD-10-CM | POA: Diagnosis not present

## 2023-10-11 NOTE — Progress Notes (Signed)
Primary Care Physician: Benita Stabile, MD Primary Cardiologist: Dr Herbie Baltimore Primary Electrophysiologist: Dr Nelly Laurence  Referring Physician: [facility]   Maria Meza is a 67 y.o. female with a history of OSA, SVT, TIA, hypothyroidism, atrial fibrillation who presents for follow up in the [facility].  She has a remote history of SVT and PVCs and had an EP study many years ago, no ablation. The patient was initially diagnosed with atrial fibrillation 08/2022 after presenting to the ED with symptoms of palpitations, lightheadedness, and mild dyspnea. She was started on Eliquis for a CHADS2VASC score of 4 and underwent TEE guided DCCV. She was again seen at the ED 10/30/22 with palpitations and underwent repeat DCCV. She denies significant alcohol use and is compliant with her CPAP.   Patient did see Dr Nelly Laurence 11/2022 and was scheduled for an ablation. However, she started having more palpitations and heat intolerance. Her thyroid levels were checked and she was hyperthyroid. Her levothyroxine dose was reduced. Her ablation was cancelled while her thyroid medication was being adjusted.   On follow up today, patient reports that she has done well since her last visit. She rarely feels like her heart is "about to go into afib" but it never does. No bleeding issues on anticoagulation.   Today, she denies symptoms of palpitations, chest pain, shortness of breath, orthopnea, PND, lower extremity edema, dizziness, presyncope, syncope, bleeding, or neurologic sequela. The patient is tolerating medications without difficulties and is otherwise without complaint today.    Atrial Fibrillation Risk Factors:  she does have symptoms or diagnosis of sleep apnea. she is compliant with CPAP therapy. she does not have a history of rheumatic fever. she does not have a history of alcohol use.   she has a BMI of Body mass index is 44.33 kg/m.Marland Kitchen Filed Weights   10/11/23 1017   Weight: 120.8 kg    Family History  Problem Relation Age of Onset   Heart attack Father        dec age 71   Hypertension Father    Hypertension Mother    Hyperlipidemia Mother    Migraines Mother    Seizures Mother        epilepsy   Stroke Mother        TIA's   Thyroid disease Sister        hypothyroid   Thyroid disease Sister        hypothyroid   Rheum arthritis Maternal Grandmother    Migraines Maternal Grandmother    Cancer Maternal Grandfather 73       colon ca--DEC age 58   Diabetes Maternal Grandfather    Stroke Paternal Grandmother        multiple   Thyroid disease Paternal Grandmother        goiter-hypothyroid   Breast cancer Maternal Aunt      Atrial Fibrillation Management history:  Previous antiarrhythmic drugs: amiodarone, propafenone  Previous cardioversions: 09/14/22, 10/30/22 Previous ablations: none CHADS2VASC score: 4 Anticoagulation history: Eliquis   Past Medical History:  Diagnosis Date   Absolute anemia    No longer on iron supplementation.   Adult celiac disease 2018   Anxiety    Arrhythmia 06/2016   Event monitor has shown PVCs, PSVT at 1 short burst of NSVT.;  Event monitor from March 2021: 6 short runs of wide-complex tachycardia and 25 runs of PSVT--intolerant of high-dose beta-blocker because of bradycardia.   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  (ANA positive, dsDNA negative)   Diastolic CHF (HCC)    Dysmenorrhea    Elevated LFTs    Fatty liver    Glaucoma    History of COVID-19 02/19/2020   IBS (irritable bowel syndrome)    Morbid obesity with BMI of 40.0-44.9, adult (HCC) 09/2020   BMI 43.07   MRSA infection 4-end-21   on abdomen   OSA on CPAP    Pulmonary hypertension (HCC)    PVC's  (premature ventricular contractions)    Thyroid disease    hypothyroidism   TIA (transient ischemic attack)    2 per patient   Urinary incontinence    with sneezing, coughing   Past Surgical History:  Procedure Laterality Date   CARDIAC EVENT MONITOR  06/2016   [facility]) Noted PVCs, PSVT and NSVT (1 episode of 20 beats). ->  PT evaluation suggested PVCs appear to have been completely interpolated.  Felt to be septal   CARDIAC MRI -ADENOSINE / STRESS  05/28/2020   [facility]): EF 69%. Normal LV size, thickness and function w/ NO RWMA  CO & CI 6.5 L/min & 3 L/min.      Normal RV size thickness and function.  No RV WMA or aneurysm.  No findings c/w  ARVC.  Both atria are mildly enlarged.  Trileaflet aortic valve with no evidence of stenosis.  Adenosine Stress Imaging: no  inducible myocardial ischemia OR prior infarction/scar or infiltrate. Normal AoV. Mid TR.   CARDIOVERSION N/A 09/14/2022   Procedure: CARDIOVERSION;  Surgeon: Jake Bathe, MD;  Location: [facility];  Service: Cardiovascular;  Laterality: N/A;   CATARACT EXTRACTION Right 03/2018   CATARACT EXTRACTION Left 03/2018   CHOLECYSTECTOMY     LAPAROSCOPIC TUBAL LIGATION  1986   NUCLEAR STRESS TEST  05/2014   EF 55%.  Mixed anteroseptal defect consistent with breast attenuation artifact.   TEE WITHOUT CARDIOVERSION N/A 09/14/2022   Procedure: TRANSESOPHAGEAL ECHOCARDIOGRAM (TEE);  Surgeon: Jake Bathe, MD;  Location: [facility];  Service: Cardiovascular;  Laterality: N/A;   TRANSTHORACIC ECHOCARDIOGRAM  08/2016   [facility]- Normal LV size and function-EF~55%.  No RWMA..  Normal LA pressures.  Normal RV function.  Trivial AR, PR and TR.  No stenoses.    TRANSTHORACIC ECHOCARDIOGRAM  02/14/2022   Difficult images.  Normal EF 60 to 65%.  No RWMA.  Normal diastolic parameters.  Normal RV size and function.  Mild to moderate LA dilation.  Normal aortic and mitral valves.  Acing aorta measured roughly 38 mm.   ZIO PATCH CARDIAC EVENT MONITOR  02/2020    [facility]om Rhytym: SR - min 46 - max 113 bpm, avg 64 bpm. Rare isolated PVC & PACs. 6 short NSVT runs (8-11 beats, 134-245 bpm); 25 runs of  PAT/SVT - fastest 9 beats (200 bpm), longest 10.6 sec (130 bpm). Noted Sx w/ PAT & NSVT during daylight hours, and only once at midnight.   ZIO PATCH MONITOR  01/2022   Predominant SR: Rate range 46-99 bpm, avg 61 bpm.  Rare PACs & PVCs (& rare couplets).  No bigeminy/trigeminy.  (Sx noted w/ PACs and PVCs - & a few Atrial Runs).  35 Atrial Runs + 1 "V. tach "run of 4 Narrow Complex beats; fastest atrial run 5 beats @ 193  bpm & longest 22 beats (12.1 sec) @ avg 108 bpm.    Current Outpatient Medications  Medication Sig Dispense Refill   apixaban (ELIQUIS) 5 MG TABS tablet Take 1 tablet (5 mg total) by mouth 2 (two) times daily. 60 tablet 1   Ascorbic Acid (VITAMIN C) 1000 MG tablet Take 1,000 mg by mouth daily.     b complex vitamins tablet Take by mouth daily.     Cholecalciferol (VITAMIN D3) 1.25 MG (50000 UT) CAPS Take 1 capsule by mouth once a week.     folic acid (FOLVITE) 1 MG tablet Take 2 mg by mouth daily. 2 tablets daily     latanoprost (XALATAN) 0.005 % ophthalmic solution Place 1 drop into both eyes daily.      levothyroxine (SYNTHROID) 137 MCG tablet Take 137 mcg by mouth daily.     magnesium oxide (MAG-OX) 400 MG tablet TAKE 1 TABLET BY MOUTH TWICE A DAY (Patient taking differently: Take 1 tablet by mouth daily.) 180 tablet 3   metoprolol tartrate (LOPRESSOR) 25 MG tablet Take 1 tablet (25 mg total) by mouth 2 (two) times daily. 180 tablet 3   mometasone (ELOCON) 0.1 % lotion Apply 1 application topically.     No current facility-administered medications for this encounter.    Allergies  Allergen Reactions   Amiodarone Other (See Comments)    "does not tolerate"--slows heart rate   Latex Other (See Comments)    blisters   Propafenone     Other Reaction(s): Other (See Comments)  Heart felt like it was going to stop; med did not work  well.    Caused very low HR   Gluten Meal     Pt has celiac disease.    Hydrocodone Nausea And Vomiting   Propafenone Hcl     Caused very low HR   Verapamil     Fatigue, constipation    Social History   Socioeconomic History   Marital status: Married    Spouse name: Not on file   Number of children: Not on file   Years of education: Not on file   Highest education level: Not on file  Occupational History   Not on file  Tobacco Use   Smoking status: Never   Smokeless tobacco: Never   Tobacco comments:    Never smoke 11/28/22  Vaping Use   Vaping status: Never Used  Substance and Sexual Activity   Alcohol use: No    Alcohol/week: 0.0 standard drinks of alcohol   Drug use: No   Sexual activity: Not Currently    Partners: Male    Birth control/protection: Post-menopausal  Other Topics Concern   Not on file  Social History Narrative   She lives in [place], [place]   Right handed    Caffeine use: 1 cup coffee every morning   Social Determinants of Health   Financial Resource Strain: Not on file  Food Insecurity: No Food Insecurity (09/10/2022)   Hunger Vital Sign    Worried About Running Out of Food in the Last Year: Never true    Ran Out of Food in the Last Year: Never true  Transportation Needs: No Transportation Needs (09/10/2022)   PRAPARE - Administrator, Civil Service (Medical): No    Lack of Transportation (Non-Medical): No  Physical Activity: Not on file  Stress: Not on file  Social Connections: Not on file  Intimate Partner Violence: Not At Risk (09/10/2022)   Humiliation, Afraid, Rape, and Kick questionnaire  Fear of Current or Ex-Partner: No    Emotionally Abused: No    Physically Abused: No    Sexually Abused: No     ROS- All systems are reviewed and negative except as per the HPI above.  Physical Exam: Vitals:   10/11/23 1017  BP: 118/78  Pulse: (!) 53  Weight: 120.8 kg  Height: 5\' 5"  (1.651 m)    GEN: Well nourished, well  developed in no acute distress NECK: No JVD; No carotid bruits CARDIAC: Regular rate and rhythm, no murmurs, rubs, gallops RESPIRATORY:  Clear to auscultation without rales, wheezing or rhonchi  ABDOMEN: Soft, non-tender, non-distended EXTREMITIES:  No edema; No deformity    Wt Readings from Last 3 Encounters:  10/11/23 120.8 kg  07/19/23 120.2 kg  05/15/23 119 kg    EKG today demonstrates  SB, RBBB Vent. rate 53 BPM PR interval 146 ms QRS duration 134 ms QT/QTcB 440/412 ms  Echo 02/14/22 demonstrated   1. Difficult acoustic windows. Difficult to see endocardium . Left  ventricular ejection fraction, by estimation, is 60 to 65%. The left  ventricle has normal function. Left ventricular diastolic parameters were  normal.   2. Right ventricular systolic function is normal. The right ventricular  size is normal.   3. Left atrial size was mild to moderately dilated.   4. The mitral valve is normal in structure. Trivial mitral valve  regurgitation.   5. The aortic valve is tricuspid. Aortic valve regurgitation is not  visualized.   6. Aortic dilatation noted. There is borderline dilatation of the  ascending aorta, measuring 38 mm.   Epic records are reviewed at length today  CHA2DS2-VASc Score = 5  The patient's score is based upon: CHF History: 0 HTN History: 1 Diabetes History: 0 Stroke History: 2 Vascular Disease History: 0 Age Score: 1 Gender Score: 1       ASSESSMENT AND PLAN: Persistent Atrial Fibrillation (ICD10:  I48.19) The patient's CHA2DS2-VASc score is 5, indicating a 7.2% annual risk of stroke.   Patient previously failed propafenone and amiodarone due to bradycardia.  Patient appears to be maintaining SR off AAD.  Continue Eliquis 5 mg BID Continue Lopressor 25 mg BID. Patient may take an extra 12.5 mg PRN for heart racing.   Secondary Hypercoagulable State (ICD10:  D68.69) The patient is at significant risk for stroke/thromboembolism based upon her  CHA2DS2-VASc Score of 5.  Continue Apixaban (Eliquis).   Obesity Body mass index is 44.33 kg/m.  Encouraged lifestyle modification  OSA  Encouraged nightly CPAP   Follow up in the AF clinic in one year.    Jorja Loa PA-C [facility] [address] [place], [place] [address] [phone] 10/11/2023 10:46 AM

## 2023-10-16 ENCOUNTER — Ambulatory Visit
Admission: RE | Admit: 2023-10-16 | Discharge: 2023-10-16 | Disposition: A | Discharge status: Home / Self Care | Payer: Medicare Other | Source: Ambulatory Visit | Attending: Obstetrics and Gynecology | Admitting: Obstetrics and Gynecology

## 2023-10-16 ENCOUNTER — Other Ambulatory Visit (HOSPITAL_COMMUNITY)
Admission: RE | Admit: 2023-10-16 | Discharge: 2023-10-16 | Disposition: A | Discharge status: Home / Self Care | Payer: Medicare Other | Source: Ambulatory Visit | Attending: Obstetrics and Gynecology | Admitting: Obstetrics and Gynecology

## 2023-10-16 ENCOUNTER — Telehealth: Payer: Self-pay | Admitting: Obstetrics and Gynecology

## 2023-10-16 ENCOUNTER — Ambulatory Visit (INDEPENDENT_AMBULATORY_CARE_PROVIDER_SITE_OTHER): Payer: Medicare Other | Admitting: Obstetrics and Gynecology

## 2023-10-16 ENCOUNTER — Encounter: Payer: Self-pay | Admitting: Obstetrics and Gynecology

## 2023-10-16 VITALS — BP 128/84 | HR 57 | Ht 65.5 in | Wt 268.0 lb

## 2023-10-16 DIAGNOSIS — K429 Umbilical hernia without obstruction or gangrene: Secondary | ICD-10-CM

## 2023-10-16 DIAGNOSIS — Z1151 Encounter for screening for human papillomavirus (HPV): Secondary | ICD-10-CM | POA: Insufficient documentation

## 2023-10-16 DIAGNOSIS — Z01419 Encounter for gynecological examination (general) (routine) without abnormal findings: Secondary | ICD-10-CM | POA: Diagnosis not present

## 2023-10-16 DIAGNOSIS — R58 Hemorrhage, not elsewhere classified: Secondary | ICD-10-CM | POA: Insufficient documentation

## 2023-10-16 DIAGNOSIS — Z01411 Encounter for gynecological examination (general) (routine) with abnormal findings: Secondary | ICD-10-CM | POA: Diagnosis present

## 2023-10-16 DIAGNOSIS — Z124 Encounter for screening for malignant neoplasm of cervix: Secondary | ICD-10-CM | POA: Insufficient documentation

## 2023-10-16 DIAGNOSIS — Z1231 Encounter for screening mammogram for malignant neoplasm of breast: Secondary | ICD-10-CM

## 2023-10-16 NOTE — Telephone Encounter (Signed)
Left detailed message, ok per dpr. An order has been placed for a pelvic ultrasound by Dr. Edward Jolly. You will need to contact [facility] Main Radiology Scheduling at [phone] to schedule. If you have any additional questions or need any any additional assistance, contact the office at [phone], option 4.

## 2023-10-16 NOTE — Telephone Encounter (Signed)
PUS scheduled for 10/23/23.   Encounter closed.

## 2023-10-16 NOTE — Telephone Encounter (Signed)
Please schedule pelvic ultrasound at [facility].   Patient has bleeding from an unknown site.

## 2023-10-16 NOTE — Patient Instructions (Signed)

## 2023-10-23 ENCOUNTER — Ambulatory Visit (HOSPITAL_COMMUNITY)
Admission: RE | Admit: 2023-10-23 | Discharge: 2023-10-23 | Disposition: A | Discharge status: Home / Self Care | Payer: Medicare Other | Source: Ambulatory Visit | Attending: Obstetrics and Gynecology | Admitting: Obstetrics and Gynecology

## 2023-10-23 ENCOUNTER — Encounter (HOSPITAL_COMMUNITY)
Admission: RE | Admit: 2023-10-23 | Discharge: 2023-10-23 | Disposition: A | Discharge status: Home / Self Care | Payer: Medicare Other | Source: Ambulatory Visit

## 2023-10-23 ENCOUNTER — Encounter (HOSPITAL_COMMUNITY): Payer: Self-pay | Admitting: Internal Medicine

## 2023-10-23 DIAGNOSIS — R58 Hemorrhage, not elsewhere classified: Secondary | ICD-10-CM | POA: Insufficient documentation

## 2023-10-23 LAB — CYTOLOGY - PAP
Comment: NEGATIVE
Diagnosis: NEGATIVE
High risk HPV: NEGATIVE

## 2023-10-27 ENCOUNTER — Encounter (HOSPITAL_COMMUNITY): Admission: RE | Admit: 2023-10-27 | Payer: Medicare Other | Source: Ambulatory Visit

## 2023-10-31 ENCOUNTER — Encounter: Payer: Self-pay | Admitting: Obstetrics and Gynecology

## 2023-10-31 NOTE — Telephone Encounter (Signed)
Routing for final review.

## 2023-11-01 ENCOUNTER — Ambulatory Visit (HOSPITAL_COMMUNITY): Payer: Self-pay | Admitting: Certified Registered Nurse Anesthetist

## 2023-11-01 ENCOUNTER — Encounter (HOSPITAL_COMMUNITY): Payer: Self-pay | Admitting: Internal Medicine

## 2023-11-01 ENCOUNTER — Ambulatory Visit (HOSPITAL_COMMUNITY): Payer: Medicare Other | Admitting: Certified Registered Nurse Anesthetist

## 2023-11-01 ENCOUNTER — Encounter (HOSPITAL_COMMUNITY)
Admission: RE | Disposition: A | Discharge status: Home / Self Care | Payer: Self-pay | Source: Home / Self Care | Attending: Internal Medicine

## 2023-11-01 ENCOUNTER — Ambulatory Visit (HOSPITAL_COMMUNITY)
Admission: RE | Admit: 2023-11-01 | Discharge: 2023-11-01 | Disposition: A | Discharge status: Home / Self Care | Payer: Medicare Other | Attending: Internal Medicine | Admitting: Internal Medicine

## 2023-11-01 DIAGNOSIS — K222 Esophageal obstruction: Secondary | ICD-10-CM | POA: Insufficient documentation

## 2023-11-01 DIAGNOSIS — Z6841 Body Mass Index (BMI) 40.0 and over, adult: Secondary | ICD-10-CM | POA: Diagnosis not present

## 2023-11-01 DIAGNOSIS — K2100 Gastro-esophageal reflux disease with esophagitis, without bleeding: Secondary | ICD-10-CM | POA: Insufficient documentation

## 2023-11-01 DIAGNOSIS — K449 Diaphragmatic hernia without obstruction or gangrene: Secondary | ICD-10-CM | POA: Insufficient documentation

## 2023-11-01 DIAGNOSIS — K2289 Other specified disease of esophagus: Secondary | ICD-10-CM | POA: Diagnosis not present

## 2023-11-01 DIAGNOSIS — Z7901 Long term (current) use of anticoagulants: Secondary | ICD-10-CM | POA: Insufficient documentation

## 2023-11-01 DIAGNOSIS — K3189 Other diseases of stomach and duodenum: Secondary | ICD-10-CM

## 2023-11-01 DIAGNOSIS — R1013 Epigastric pain: Secondary | ICD-10-CM | POA: Diagnosis present

## 2023-11-01 DIAGNOSIS — K900 Celiac disease: Secondary | ICD-10-CM | POA: Diagnosis not present

## 2023-11-01 DIAGNOSIS — Z9889 Other specified postprocedural states: Secondary | ICD-10-CM | POA: Diagnosis not present

## 2023-11-01 DIAGNOSIS — K2950 Unspecified chronic gastritis without bleeding: Secondary | ICD-10-CM | POA: Diagnosis not present

## 2023-11-01 DIAGNOSIS — E039 Hypothyroidism, unspecified: Secondary | ICD-10-CM | POA: Diagnosis not present

## 2023-11-01 HISTORY — DX: Gastro-esophageal reflux disease without esophagitis: K21.9

## 2023-11-01 HISTORY — PX: ESOPHAGOGASTRODUODENOSCOPY (EGD) WITH PROPOFOL: SHX5813

## 2023-11-01 HISTORY — DX: Unspecified osteoarthritis, unspecified site: M19.90

## 2023-11-01 HISTORY — PX: BIOPSY: SHX5522

## 2023-11-01 HISTORY — DX: Cardiac arrhythmia, unspecified: I49.9

## 2023-11-01 HISTORY — DX: Hypothyroidism, unspecified: E03.9

## 2023-11-01 SURGERY — ESOPHAGOGASTRODUODENOSCOPY (EGD) WITH PROPOFOL
Anesthesia: General

## 2023-11-01 MED ORDER — LACTATED RINGERS IV SOLN
INTRAVENOUS | Status: DC | PRN
Start: 2023-11-01 — End: 2023-11-01

## 2023-11-01 MED ORDER — LIDOCAINE HCL (PF) 2 % IJ SOLN
INTRAMUSCULAR | Status: AC
  Filled 2023-11-01: qty 5

## 2023-11-01 MED ORDER — PROPOFOL 500 MG/50ML IV EMUL
INTRAVENOUS | Status: AC
  Filled 2023-11-01: qty 50

## 2023-11-01 MED ORDER — LIDOCAINE HCL (PF) 2 % IJ SOLN
INTRAMUSCULAR | Status: DC | PRN
Start: 2023-11-01 — End: 2023-11-01
  Administered 2023-11-01: 50 mg via INTRADERMAL

## 2023-11-01 MED ORDER — PROPOFOL 10 MG/ML IV BOLUS
INTRAVENOUS | Status: DC | PRN
Start: 2023-11-01 — End: 2023-11-01
  Administered 2023-11-01: 150 ug/kg/min via INTRAVENOUS
  Administered 2023-11-01: 80 mg via INTRAVENOUS

## 2023-11-01 NOTE — Progress Notes (Signed)
   11/01/23 1123  OBSTRUCTIVE SLEEP APNEA  Have you ever been diagnosed with sleep apnea through a sleep study? Yes  If yes, do you have and use a CPAP or BPAP machine every night? 1  Do you know the presssure settings on your maching? Yes (10)  Do you snore loudly (loud enough to be heard through closed doors)?  1  Do you often feel tired, fatigued, or sleepy during the daytime (such as falling asleep during driving or talking to someone)? 0  Has anyone observed you stop breathing during your sleep? 1  Do you have, or are you being treated for high blood pressure? 1  BMI more than 35 kg/m2? 1  Age > 50 (1-yes) 1  Female Gender (Yes=1) 0  Obstructive Sleep Apnea Score 5  Score 5 or greater  Results sent to PCP

## 2023-11-01 NOTE — Transfer of Care (Signed)
Immediate Anesthesia Transfer of Care Note  Patient: Maria Meza  Procedure(s) Performed: ESOPHAGOGASTRODUODENOSCOPY (EGD) WITH PROPOFOL BIOPSY  Patient Location: Short Stay  Anesthesia Type:General  Level of Consciousness: awake, alert , and oriented  Airway & Oxygen Therapy: Patient Spontanous Breathing  Post-op Assessment: Report given to RN, Post -op Vital signs reviewed and stable, Patient moving all extremities X 4, and Patient able to stick tongue midline  Post vital signs: Reviewed and stable  Last Vitals:  Vitals Value Taken Time  BP 146/48 11/01/23 1250  Temp 36.6 C 11/01/23 1250  Pulse 66 11/01/23 1250  Resp 16 11/01/23 1250  SpO2 98 % 11/01/23 1250    Last Pain:  Vitals:   11/01/23 1250  TempSrc: Axillary  PainSc: 0-No pain         Complications: No notable events documented.

## 2023-11-01 NOTE — Anesthesia Preprocedure Evaluation (Signed)
Anesthesia Evaluation  Patient identified by MRN, date of birth, ID band Patient awake    Reviewed: Allergy & Precautions, H&P , NPO status , Patient's Chart, lab work & pertinent test results, reviewed documented beta blocker date and time   Airway Mallampati: II  TM Distance: >3 FB Neck ROM: full    Dental no notable dental hx.    Pulmonary neg pulmonary ROS, sleep apnea    Pulmonary exam normal breath sounds clear to auscultation       Cardiovascular Exercise Tolerance: Good negative cardio ROS + dysrhythmias  Rhythm:regular Rate:Normal     Neuro/Psych   Anxiety     TIAnegative neurological ROS  negative psych ROS   GI/Hepatic negative GI ROS, Neg liver ROS,GERD  ,,  Endo/Other  Hypothyroidism  Morbid obesity  Renal/GU negative Renal ROS  negative genitourinary   Musculoskeletal   Abdominal   Peds  Hematology negative hematology ROS (+) Blood dyscrasia, anemia   Anesthesia Other Findings   Reproductive/Obstetrics negative OB ROS                             Anesthesia Physical Anesthesia Plan  ASA: 3  Anesthesia Plan: General   Post-op Pain Management:    Induction:   PONV Risk Score and Plan: Propofol infusion  Airway Management Planned:   Additional Equipment:   Intra-op Plan:   Post-operative Plan:   Informed Consent: I have reviewed the patients History and Physical, chart, labs and discussed the procedure including the risks, benefits and alternatives for the proposed anesthesia with the patient or authorized representative who has indicated his/her understanding and acceptance.     Dental Advisory Given  Plan Discussed with: CRNA  Anesthesia Plan Comments:        Anesthesia Quick Evaluation

## 2023-11-01 NOTE — Discharge Instructions (Addendum)
EGD Discharge instructions Please read the instructions outlined below and refer to this sheet in the next few weeks. These discharge instructions provide you with general information on caring for yourself after you leave the hospital. Your doctor may also give you specific instructions. While your treatment has been planned according to the most current medical practices available, unavoidable complications occasionally occur. If you have any problems or questions after discharge, please call your doctor. ACTIVITY You may resume your regular activity but move at a slower pace for the next 24 hours.  Take frequent rest periods for the next 24 hours.  Walking will help expel (get rid of) the air and reduce the bloated feeling in your abdomen.  No driving for 24 hours (because of the anesthesia (medicine) used during the test).  You may shower.  Do not sign any important legal documents or operate any machinery for 24 hours (because of the anesthesia used during the test).  NUTRITION Drink plenty of fluids.  You may resume your normal diet.  Begin with a light meal and progress to your normal diet.  Avoid alcoholic beverages for 24 hours or as instructed by your caregiver.  MEDICATIONS You may resume your normal medications unless your caregiver tells you otherwise.  WHAT YOU CAN EXPECT TODAY You may experience abdominal discomfort such as a feeling of fullness or "gas" pains.  FOLLOW-UP Your doctor will discuss the results of your test with you.  SEEK IMMEDIATE MEDICAL ATTENTION IF ANY OF THE FOLLOWING OCCUR: Excessive nausea (feeling sick to your stomach) and/or vomiting.  Severe abdominal pain and distention (swelling).  Trouble swallowing.  Temperature over 101 F (37.8 C).  Rectal bleeding or vomiting of blood.       You do have acid reflux burns in your esophagus     you have a small hiatal hernia.  Biopsies taken.        If minimal changes in your duodenum to go with celiac  disease -  biopsies taken       trial of Protonix 40 mg pill take (1)  30 minutes before breakfast daily for acid reflux  New prescription is already been called to your pharmacy from my office.    Office visit with Tana Coast in 4 to 6 weeks   at patient request I called Jodi Marble at [phone] -  reviewed findings and recommendations    Resume Eliquis  tomorrow.

## 2023-11-01 NOTE — Op Note (Signed)
[facility] Patient Name: Maria Meza Procedure Date: 11/01/2023 11:49 AM MRN: [id number] Date of Birth: [birth year] Attending MD: Gennette Pac , MD, [id number] CSN: [id number] Age: 67 Admit Type: Outpatient Procedure:                Upper GI endoscopy Indications:              Epigastric abdominal pain, Abdominal pain in the                            right upper quadrant; denies dysphagia Providers:                Gennette Pac, MD, Crystal Page, Elinor Parkinson Referring MD:              Medicines:                Propofol per Anesthesia Complications:            No immediate complications. Estimated Blood Loss:     Estimated blood loss was minimal. Procedure:                Pre-Anesthesia Assessment:                           - Prior to the procedure, a History and Physical                            was performed, and patient medications and                            allergies were reviewed. The patient's tolerance of                            previous anesthesia was also reviewed. The risks                            and benefits of the procedure and the sedation                            options and risks were discussed with the patient.                            All questions were answered, and informed consent                            was obtained. Prior Anticoagulants: The patient has                            taken no anticoagulant or antiplatelet agents and                            last took Eliquis (apixaban) 3 days prior to the  procedure. ASA Grade Assessment: III - A patient                            with severe systemic disease. After reviewing the                            risks and benefits, the patient was deemed in                            satisfactory condition to undergo the procedure.                           After obtaining informed consent, the endoscope was                             passed under direct vision. Throughout the                            procedure, the patient's blood pressure, pulse, and                            oxygen saturations were monitored continuously. The                            [id number]-H190 ([id number]) scope was introduced through the                            mouth, and advanced to the third part of duodenum.                            The upper GI endoscopy was accomplished without                            difficulty. The patient tolerated the procedure                            well. Scope In: 12:40:27 PM Scope Out: 12:47:02 PM Total Procedure Duration: 0 hours 6 minutes 35 seconds  Findings:      Circumferential distal esophageal erosions straddling the EG junction       overlying a noncritical Schatzki's ring. No nodularity seen. Patent       tubular esophagus throughout its course.      Stomach empty. Small hiatal hernia. Fairly intense patchy erythema of       the gastric antrum and body of uncertain significance. No ulcer or       infiltrating process. Pylorus patent and easily traversed. Examination       of D1, D2-D3 revealed a prominent ampulla. Subtle scalloping of the fold       tips. Biopsies the second third portion of the duodenum taken. Also,       biopsies the abnormal appearing gastric mucosa taken. Impression:               - Mild erosive reflux esophagitis. Noncritical  Schatzki's ring?"not manipulated.                           -Small hiatal hernia. Abnormal gastric mucosa as                            described above of unclear significance status post                            gastric biopsy                           -Prominent ampulla. Subtle scalloping of duodenal                            fold tips. Status post biopsy.                           -I have to wonder if patient is epigastric and                            right upper quadrant pain may have a reflux                             component. She is not on any acid suppression                            therapy at this time.                           . Moderate Sedation:      Moderate (conscious) sedation was personally administered by an       anesthesia professional. The following parameters were monitored: oxygen       saturation, heart rate, blood pressure, respiratory rate, EKG, adequacy       of pulmonary ventilation, and response to care. Recommendation:           - Patient has a contact number available for                            emergencies. The signs and symptoms of potential                            delayed complications were discussed with the                            patient. Return to normal activities tomorrow.                            Written discharge instructions were provided to the                            patient.                           - Advance diet as tolerated. Gluten-free. Follow-up  on biopsies. Trial of Protonix 40 mg pill 1 daily                            30 minutes before breakfast. Office visit with [place]                            in 4 to 6 weeks. Procedure Code(s):        --- Professional ---                           [id number], Esophagogastroduodenoscopy, flexible,                            transoral; diagnostic, including collection of                            specimen(s) by brushing or washing, when performed                            (separate procedure) Diagnosis Code(s):        --- Professional ---                           R10.13, Epigastric pain                           R10.11, Right upper quadrant pain CPT copyright 2022 American Medical Association. All rights reserved. The codes documented in this report are preliminary and upon coder review may  be revised to meet current compliance requirements. Gerrit Friends. Rashied Corallo, MD Gennette Pac, MD 11/01/2023 1:03:35 PM This report has been signed electronically. Number  of Addenda: 0

## 2023-11-01 NOTE — H&P (Signed)
@LOGO @   Primary Care Physician:  Benita Stabile, MD Primary Gastroenterologist:  Dr.   Pre-Procedure History & Physical: HPI:  Maria Meza is a 67 y.o. female here for   EGD to further evaluate epigastric pain no dysphagia.  Past Medical History:  Diagnosis Date   Absolute anemia    No longer on iron supplementation.   Adult celiac disease 2018   Anxiety    Arrhythmia 06/2016   Event monitor has shown PVCs, PSVT at 1 short burst of NSVT.;  Event monitor from March 2021: 6 short runs of wide-complex tachycardia and 25 runs of PSVT--intolerant of high-dose beta-blocker because of bradycardia.   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  (ANA positive, dsDNA negative)   Dysmenorrhea    Dysrhythmia    Elevated LFTs    Fatty liver    GERD (gastroesophageal reflux disease)    Glaucoma    History of COVID-19 02/19/2020   Hypothyroidism    IBS (irritable bowel syndrome)    Morbid obesity with BMI of 40.0-44.9, adult (HCC) 09/2020   BMI 43.07   MRSA infection 4-end-21   on abdomen   OSA on CPAP    Pulmonary hypertension (HCC)    PVC's (premature ventricular contractions)    Thyroid disease    hypothyroidism   TIA (transient ischemic attack)    2 per patient   Urinary incontinence    with sneezing, coughing    Past Surgical History:  Procedure Laterality Date   CARDIAC EVENT MONITOR  06/2016   [facility]) Noted PVCs, PSVT and NSVT (1 episode of 20 beats). ->  PT evaluation suggested PVCs appear to have been completely interpolated.  Felt to be septal   CARDIAC MRI -ADENOSINE / STRESS  05/28/2020   [facility]): EF 69%. Normal LV size, thickness and function w/ NO RWMA  CO & CI 6.5 L/min & 3 L/min.      Normal RV size thickness and function.  No  RV WMA or aneurysm.  No findings c/w  ARVC.  Both atria are mildly enlarged.  Trileaflet aortic valve with no evidence of stenosis.  Adenosine Stress Imaging: no  inducible myocardial ischemia OR prior infarction/scar or infiltrate. Normal AoV. Mid TR.   CARDIOVERSION N/A 09/14/2022   Procedure: CARDIOVERSION;  Surgeon: Jake Bathe, MD;  Location: [facility];  Service: Cardiovascular;  Laterality: N/A;   CATARACT EXTRACTION Right 03/2018   CATARACT EXTRACTION Left 03/2018   CHOLECYSTECTOMY     LAPAROSCOPIC TUBAL LIGATION  1986   NUCLEAR STRESS TEST  05/2014   EF 55%.  Mixed anteroseptal defect consistent with breast attenuation artifact.   TEE WITHOUT CARDIOVERSION N/A 09/14/2022   Procedure: TRANSESOPHAGEAL ECHOCARDIOGRAM (TEE);  Surgeon: Jake Bathe, MD;  Location: [facility];  Service: Cardiovascular;  Laterality: N/A;   TRANSTHORACIC ECHOCARDIOGRAM  08/2016   [facility]- Normal LV size and function-EF~55%.  No RWMA..  Normal LA pressures.  Normal RV function.  Trivial AR, PR and TR.  No stenoses.    TRANSTHORACIC ECHOCARDIOGRAM  02/14/2022   Difficult images.  Normal EF 60 to 65%.  No RWMA.  Normal diastolic parameters.  Normal RV size and function.  Mild to moderate LA dilation.  Normal aortic and mitral valves.  Acing  aorta measured roughly 38 mm.   ZIO PATCH CARDIAC EVENT MONITOR  02/2020   [facility]ty[facility]: SR - min 46 - max 113 bpm, avg 64 bpm. Rare isolated PVC & PACs. 6 short NSVT runs (8-11 beats, 134-245 bpm); 25 runs of  PAT/SVT - fastest 9 beats (200 bpm), longest 10.6 sec (130 bpm). Noted Sx w/ PAT & NSVT during daylight hours, and only once at midnight.   ZIO PATCH MONITOR  01/2022   Predominant SR: Rate range 46-99 bpm, avg 61 bpm.  Rare PACs & PVCs (& rare couplets).  No bigeminy/trigeminy.  (Sx noted w/ PACs and PVCs - & a few Atrial Runs).  35 Atrial Runs + 1 "V. tach "run of 4 Narrow Complex beats; fastest atrial run 5 beats @ 193 bpm & longest 22 beats (12.1 sec) @ avg 108 bpm.     Prior to Admission medications   Medication Sig Start Date End Date Taking? Authorizing Provider  cholecalciferol (VITAMIN D3) 25 MCG (1000 UNIT) tablet Take 400 mcg by mouth daily.   Yes [provider]  levothyroxine (SYNTHROID) 137 MCG tablet Take 137 mcg by mouth daily. 09/15/23  Yes [provider]  metoprolol tartrate (LOPRESSOR) 25 MG tablet Take 1 tablet (25 mg total) by mouth 2 (two) times daily. 10/07/22  Yes Cleaver, Thomasene Ripple, NP  apixaban (ELIQUIS) 5 MG TABS tablet Take 1 tablet (5 mg total) by mouth 2 (two) times daily. 09/15/22   Catarina Hartshorn, MD  Ascorbic Acid (VITAMIN C) 1000 MG tablet Take 1,000 mg by mouth daily.    [provider]  b complex vitamins tablet Take by mouth daily.    [provider]  Cholecalciferol (VITAMIN D3) 1.25 MG (50000 UT) CAPS Take 1 capsule by mouth once a week. 04/17/23   [provider]  folic acid (FOLVITE) 1 MG tablet Take 2 mg by mouth daily. 2 tablets daily 04/21/11   [provider]  latanoprost (XALATAN) 0.005 % ophthalmic solution Place 1 drop into both eyes daily.     [provider]  magnesium oxide (MAG-OX) 400 MG tablet TAKE 1 TABLET BY MOUTH TWICE A DAY Patient taking differently: Take 1 tablet by mouth daily. 10/03/22   Marykay Lex, MD  mometasone (ELOCON) 0.1 % lotion Apply 1 application topically.    [provider]    Allergies as of 09/08/2023 - Review Complete 08/16/2023  Allergen Reaction Noted   Amiodarone Other (See Comments) 12/11/2014   Latex Other (See Comments) 09/09/2022   Propafenone  05/10/2017   Gluten meal  08/01/2019   Hydrocodone Nausea And Vomiting 11/24/2014   Propafenone hcl  09/29/2017   Verapamil  10/11/2020    Family History  Problem Relation Age of Onset   Heart attack Father        dec age 17   Hypertension Father    Hypertension Mother    Hyperlipidemia Mother    Migraines Mother    Seizures Mother        epilepsy    Stroke Mother        TIA's   Thyroid disease Sister        hypothyroid   Thyroid disease Sister        hypothyroid   Rheum arthritis Maternal Grandmother    Migraines Maternal Grandmother    Cancer Maternal Grandfather 74       colon ca--DEC age 53   Diabetes Maternal Grandfather    Stroke Paternal Grandmother  multiple   Thyroid disease Paternal Grandmother        goiter-hypothyroid   Breast cancer Maternal Aunt     Social History   Socioeconomic History   Marital status: Married    Spouse name: Not on file   Number of children: Not on file   Years of education: Not on file   Highest education level: Not on file  Occupational History   Not on file  Tobacco Use   Smoking status: Never   Smokeless tobacco: Never   Tobacco comments:    Never smoke 11/28/22  Vaping Use   Vaping status: Never Used  Substance and Sexual Activity   Alcohol use: No    Alcohol/week: 0.0 standard drinks of alcohol   Drug use: No   Sexual activity: Not Currently    Partners: Male    Birth control/protection: Post-menopausal    Comment: less than 5, after IC, no hx of STD, no abnormal pap, no DES  Other Topics Concern   Not on file  Social History Narrative   She lives in [place], [place]   Right handed    Caffeine use: 1 cup coffee every morning   Social Determinants of Health   Financial Resource Strain: Not on file  Food Insecurity: No Food Insecurity (09/10/2022)   Hunger Vital Sign    Worried About Running Out of Food in the Last Year: Never true    Ran Out of Food in the Last Year: Never true  Transportation Needs: No Transportation Needs (09/10/2022)   PRAPARE - Administrator, Civil Service (Medical): No    Lack of Transportation (Non-Medical): No  Physical Activity: Not on file  Stress: Not on file  Social Connections: Not on file  Intimate Partner Violence: Not At Risk (09/10/2022)   Humiliation, Afraid, Rape, and Kick questionnaire    Fear of Current or  Ex-Partner: No    Emotionally Abused: No    Physically Abused: No    Sexually Abused: No    Review of Systems: See HPI, otherwise negative ROS  Physical Exam: BP 131/74   Pulse 62   Temp 98.1 F (36.7 C) (Oral)   Resp (!) 8   Ht 5' 5.5" (1.664 m)   Wt 121.6 kg   LMP 04/16/2016 (Exact Date)   SpO2 98%   BMI 43.93 kg/m  General:   Alert,  Well-developed, well-nourished, pleasant and cooperative in NAD . Lungs:  Clear throughout to auscultation.   No wheezes, crackles, or rhonchi. No acute distress. Heart:  Regular rate and rhythm; no murmurs, clicks, rubs,  or gallops. Abdomen: Non-distended, normal bowel sounds.  Soft and nontender without appreciable mass or hepatosplenomegaly.  Pulses:  Normal pulses noted. Extremities:  Without clubbing or edema.  Impression/Plan:   67 year old lady here for further evaluation of epigastric pain.  History of celiac disease.  No dysphagia.    Eliquis held 2 days ago.  EGD per plan.  The risks, benefits, limitations, alternatives and imponderables have been reviewed with the patient. Potential for esophageal dilation, biopsy, etc. have also been reviewed.  Questions have been answered. All parties agreeable.    Notice: This dictation was prepared with Dragon dictation along with smaller phrase technology. Any transcriptional errors that result from this process are unintentional and may not be corrected upon review.

## 2023-11-02 ENCOUNTER — Encounter: Payer: Self-pay | Admitting: Obstetrics and Gynecology

## 2023-11-02 ENCOUNTER — Other Ambulatory Visit: Payer: Self-pay | Admitting: Obstetrics and Gynecology

## 2023-11-02 ENCOUNTER — Telehealth: Payer: Self-pay

## 2023-11-02 DIAGNOSIS — R9389 Abnormal findings on diagnostic imaging of other specified body structures: Secondary | ICD-10-CM

## 2023-11-02 DIAGNOSIS — N950 Postmenopausal bleeding: Secondary | ICD-10-CM

## 2023-11-02 MED ORDER — PANTOPRAZOLE SODIUM 40 MG PO TBEC: 40.0000 mg | DELAYED_RELEASE_TABLET | Freq: Every day | ORAL | 11 refills | Status: AC

## 2023-11-02 NOTE — Telephone Encounter (Signed)
-----   Message from Eula Listen sent at 11/01/2023 12:56 PM EST -----  at patient has reflux.  Needs a prescription for Protonix 40 mg pill dispense 30 take (1) 30 minutes before breakfast.    11 refills.

## 2023-11-03 LAB — SURGICAL PATHOLOGY

## 2023-11-04 NOTE — Anesthesia Postprocedure Evaluation (Signed)
Anesthesia Post Note  Patient: Maria Meza  Procedure(s) Performed: ESOPHAGOGASTRODUODENOSCOPY (EGD) WITH PROPOFOL BIOPSY  Patient location during evaluation: Phase II Anesthesia Type: General Level of consciousness: awake Pain management: pain level controlled Vital Signs Assessment: post-procedure vital signs reviewed and stable Respiratory status: spontaneous breathing and respiratory function stable Cardiovascular status: blood pressure returned to baseline and stable Postop Assessment: no headache and no apparent nausea or vomiting Anesthetic complications: no Comments: Late entry   No notable events documented.   Last Vitals:  Vitals:   11/01/23 1132 11/01/23 1250  BP: 131/74 (!) 146/48  Pulse: 62 66  Resp: (!) 8 16  Temp: 36.7 C 36.6 C  SpO2: 98% 98%    Last Pain:  Vitals:   11/01/23 1250  TempSrc: Axillary  PainSc: 0-No pain                 Windell Norfolk

## 2023-11-07 ENCOUNTER — Encounter (HOSPITAL_COMMUNITY): Payer: Self-pay | Admitting: Internal Medicine

## 2023-11-13 ENCOUNTER — Telehealth: Payer: Self-pay | Admitting: Cardiology

## 2023-11-13 NOTE — Telephone Encounter (Signed)
Patient calling in about patient assistant program for the medication. Please advise

## 2023-11-13 NOTE — Telephone Encounter (Signed)
Patient was calling about her husband. Information entered in his chart.

## 2023-11-16 ENCOUNTER — Encounter: Payer: Self-pay | Admitting: Obstetrics and Gynecology

## 2023-11-20 NOTE — Progress Notes (Unsigned)
GYNECOLOGY  VISIT   HPI: 67 y.o.   Married  Caucasian female   G2P2002 with Patient's last menstrual period was 04/16/2016 (exact date).   here for: endometrial biopsy due to postmenopausal bleeding and thickened endometrium on pelvic US.   She is on Eliquis due to atrial fibrillation and did not take her medication this morning.   Study Result  Narrative & Impression  CLINICAL DATA:  Postmenopausal vaginal bleeding   EXAM: TRANSABDOMINAL AND TRANSVAGINAL ULTRASOUND OF PELVIS   TECHNIQUE: Both transabdominal and transvaginal ultrasound examinations of the pelvis were performed. Transabdominal technique was performed for global imaging of the pelvis including uterus, ovaries, adnexal regions, and pelvic cul-de-sac. It was necessary to proceed with endovaginal exam following the transabdominal exam to visualize the endometrium and ovaries bilaterally.   COMPARISON:  12/11/2014   FINDINGS: Uterus   Measurements: 8.1 x 3.6 x 5.2 cm = volume: 79 mL. The uterus is anteverted. Multiple nabothian cysts are seen within the cervix which is otherwise unremarkable. Slightly limited evaluation of the uterus due to intervening loops of aerated bowel obscuring the anterior fundus. No fibroids or other mass visualized.   Endometrium   Thickness: 10 mm.  No focal abnormality visualized.   Right ovary   Not visualized.  No adnexal mass   Left ovary   Not visualized.  No adnexal mass   Other findings   No abnormal free fluid.   IMPRESSION: 1. Endometrial thickness of 10 mm. In the setting of post-menopausal bleeding, endometrial sampling is indicated to exclude carcinoma. If results are benign, sonohysterogram should be considered for focal lesion work-up. (Ref: Radiological Reasoning: Algorithmic Workup of Abnormal Vaginal Bleeding with Endovaginal Sonography and Sonohysterography. AJR 2008; 161:W96-04) 2. Nonvisualization of the ovaries. No adnexal mass.      Electronically Signed   By: Helyn Numbers M.D.   On: 11/01/2023 02:36       GYNECOLOGIC HISTORY: Patient's last menstrual period was 04/16/2016 (exact date). Contraception:  PMP Menopausal hormone therapy:  n/a Last 3 paps:  10/16/23 - Neg, neg HR HPV, 06/25/21 neg, 06/04/19 neg: HR HPV neg  History of abnormal Pap or positive HPV:  no Mammogram:  10/16/23 - BI-RADS1, cat A density .        OB History     Gravida  2   Para  2   Term  2   Preterm      AB      Living  2      SAB      IAB      Ectopic      Multiple      Live Births                 Patient Active Problem List   Diagnosis Date Noted   Right flank pain 07/19/2023   RUQ pain 07/19/2023   Celiac disease 07/19/2023   GERD (gastroesophageal reflux disease) 07/19/2023   Elevated LFTs 07/19/2023   Hypercoagulable state due to persistent atrial fibrillation (HCC) 11/28/2022   Obesity, Class III, BMI 40-49.9 (morbid obesity) (HCC) 09/14/2022   Persistent atrial fibrillation (HCC) 09/09/2022   Nonsustained ventricular tachycardia (HCC) very short bursts; negative ischemic evaluation. 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   Adult celiac disease 2018   Anxiety    Arrhythmia 06/2016   Event monitor has shown PVCs, PSVT at 1 short burst of NSVT.;  Event monitor from March 2021: 6 short runs of wide-complex tachycardia and 25 runs of PSVT--intolerant of high-dose beta-blocker because of bradycardia.   Arthritis    Atrial fibrillation (HCC)    Autonomic dysfunction    Cataract cortical, senile, bilateral    Connective tissue disease, undifferentiated (HCC) 2017   Initially  thought to be SLE, then chronic discoid lupus.  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  (ANA positive, dsDNA negative)   Dysmenorrhea    Dysrhythmia    Elevated LFTs    Fatty liver    GERD (gastroesophageal reflux disease)    Glaucoma    History of COVID-19 02/19/2020   Hypothyroidism    IBS (irritable bowel syndrome)    Morbid obesity with BMI of 40.0-44.9, adult (HCC) 09/2020   BMI 43.07   MRSA infection 4-end-21   on abdomen   OSA on CPAP    Pulmonary hypertension (HCC)    PVC's (premature ventricular contractions)    Thyroid disease    hypothyroidism   TIA (transient ischemic attack)    2 per patient   Urinary incontinence    with sneezing, coughing    Past Surgical History:  Procedure Laterality Date   BIOPSY  11/01/2023   Procedure: BIOPSY;  Surgeon: Corbin Ade, MD;  Location: [facility];  Service: Endoscopy;;   CARDIAC EVENT MONITOR  06/2016   [facility]) Noted PVCs, PSVT and NSVT (1 episode of 20 beats). ->  PT evaluation suggested PVCs appear to have been completely interpolated.  Felt to be septal   CARDIAC MRI -ADENOSINE / STRESS  05/28/2020   [facility]): EF 69%. Normal LV size, thickness and function w/ NO RWMA  CO & CI 6.5 L/min & 3 L/min.      Normal RV size thickness and function.  No RV WMA or aneurysm.  No findings c/w  ARVC.  Both atria are mildly enlarged.  Trileaflet aortic valve with no evidence of stenosis.  Adenosine Stress Imaging: no  inducible myocardial ischemia OR prior infarction/scar or infiltrate. Normal AoV. Mid TR.   CARDIOVERSION N/A 09/14/2022   Procedure: CARDIOVERSION;  Surgeon: Jake Bathe, MD;  Location: [facility];  Service: Cardiovascular;  Laterality: N/A;   CATARACT EXTRACTION Right 03/2018   CATARACT EXTRACTION Left 03/2018   CHOLECYSTECTOMY     ESOPHAGOGASTRODUODENOSCOPY (EGD) WITH PROPOFOL N/A 11/01/2023   Procedure: ESOPHAGOGASTRODUODENOSCOPY (EGD) WITH PROPOFOL;  Surgeon: Corbin Ade, MD;  Location: [facility];  Service: Endoscopy;  Laterality: N/A;  1245PM, ASA 3   LAPAROSCOPIC TUBAL LIGATION  1986   NUCLEAR STRESS TEST  05/2014   EF 55%.  Mixed anteroseptal defect consistent with breast attenuation artifact.   TEE WITHOUT CARDIOVERSION N/A 09/14/2022   Procedure: TRANSESOPHAGEAL ECHOCARDIOGRAM (TEE);  Surgeon: Jake Bathe, MD;  Location: [facility];  Service: Cardiovascular;  Laterality: N/A;   TRANSTHORACIC ECHOCARDIOGRAM  08/2016   [facility]- Normal LV size and function-EF~55%.  No RWMA..  Normal LA pressures.  Normal RV function.  Trivial AR, PR and TR.  No stenoses.    TRANSTHORACIC ECHOCARDIOGRAM  02/14/2022   Difficult images.  Normal EF 60 to 65%.  No RWMA.  Normal diastolic parameters.  Normal RV size and function.  Mild to moderate LA dilation.  Normal aortic and mitral valves.  Acing aorta measured roughly 38 mm.   ZIO PATCH CARDIAC EVENT MONITOR  02/2020   [facility]: SR - min 46 - max 113 bpm, avg 64 bpm. Rare isolated PVC & PACs. 6 short NSVT runs (8-11 beats, 134-245 bpm); 25 runs of  PAT/SVT - fastest 9 beats (200 bpm), longest 10.6 sec (130 bpm). Noted Sx w/ PAT & NSVT during daylight hours, and only once at midnight.   ZIO PATCH MONITOR  01/2022   Predominant SR: Rate range 46-99 bpm, avg 61 bpm.  Rare PACs & PVCs (& rare couplets).  No bigeminy/trigeminy.  (Sx noted w/ PACs and PVCs - & a few Atrial Runs).  35 Atrial Runs + 1 "V. tach "run of 4 Narrow Complex beats; fastest atrial run 5 beats @ 193 bpm & longest 22 beats (12.1 sec) @ avg 108 bpm.    Current Outpatient Medications  Medication Sig Dispense Refill   apixaban (ELIQUIS) 5 MG TABS tablet Take 1 tablet (5 mg total) by mouth 2 (two) times daily. 60 tablet 1   Ascorbic Acid (VITAMIN C) 1000 MG tablet Take 1,000 mg by mouth daily.     b complex vitamins tablet Take by mouth daily.     cholecalciferol (VITAMIN D3) 25 MCG (1000 UNIT) tablet Take 400 mcg by mouth daily.     folic acid (FOLVITE) 1 MG tablet Take 2 mg by mouth  daily. 2 tablets daily     latanoprost (XALATAN) 0.005 % ophthalmic solution Place 1 drop into both eyes daily.      levothyroxine (SYNTHROID) 137 MCG tablet Take 137 mcg by mouth daily.     magnesium oxide (MAG-OX) 400 MG tablet TAKE 1 TABLET BY MOUTH TWICE A DAY (Patient taking differently: Take 1 tablet by mouth daily.) 180 tablet 3   metoprolol tartrate (LOPRESSOR) 25 MG tablet Take 1 tablet (25 mg total) by mouth 2 (two) times daily. 180 tablet 3   mometasone (ELOCON) 0.1 % lotion Apply 1 application topically.     pantoprazole (PROTONIX) 40 MG tablet Take 1 tablet (40 mg total) by mouth daily. 30 tablet 11   No current facility-administered medications for this visit.     ALLERGIES: Amiodarone, Latex, Propafenone, Gluten meal, Hydrocodone, Propafenone hcl, and Verapamil  Family History  Problem Relation Age of Onset   Heart attack Father        dec age 32   Hypertension Father    Hypertension Mother    Hyperlipidemia Mother    Migraines Mother    Seizures Mother        epilepsy   Stroke Mother        TIA's   Thyroid disease Sister        hypothyroid   Thyroid disease Sister        hypothyroid   Rheum arthritis Maternal Grandmother    Migraines Maternal Grandmother    Cancer Maternal Grandfather 32       colon ca--DEC age 107   Diabetes Maternal Grandfather    Stroke Paternal Grandmother        multiple   Thyroid disease Paternal Grandmother        goiter-hypothyroid   Breast cancer Maternal Aunt     Social History   Socioeconomic History   Marital status: Married    Spouse name: Not on file   Number of children: Not on file   Years of education: Not on file   Highest education level:  Not on file  Occupational History   Not on file  Tobacco Use   Smoking status: Never   Smokeless tobacco: Never   Tobacco comments:    Never smoke 11/28/22  Vaping Use   Vaping status: Never Used  Substance and Sexual Activity   Alcohol use: No    Alcohol/week: 0.0  standard drinks of alcohol   Drug use: No   Sexual activity: Not Currently    Partners: Male    Birth control/protection: Post-menopausal    Comment: less than 5, after IC, no hx of STD, no abnormal pap, no DES  Other Topics Concern   Not on file  Social History Narrative   She lives in [place], [place]   Right handed    Caffeine use: 1 cup coffee every morning   Social Determinants of Health   Financial Resource Strain: Not on file  Food Insecurity: No Food Insecurity (09/10/2022)   Hunger Vital Sign    Worried About Running Out of Food in the Last Year: Never true    Ran Out of Food in the Last Year: Never true  Transportation Needs: No Transportation Needs (09/10/2022)   PRAPARE - Administrator, Civil Service (Medical): No    Lack of Transportation (Non-Medical): No  Physical Activity: Not on file  Stress: Not on file  Social Connections: Not on file  Intimate Partner Violence: Not At Risk (09/10/2022)   Humiliation, Afraid, Rape, and Kick questionnaire    Fear of Current or Ex-Partner: No    Emotionally Abused: No    Physically Abused: No    Sexually Abused: No    Review of Systems  All other systems reviewed and are negative.   PHYSICAL EXAMINATION:   BP 128/74 (BP Location: Left Arm, Patient Position: Sitting, Cuff Size: Large)   Pulse 63   Ht 5' 5.5" (1.664 m)   Wt 265 lb (120.2 kg)   LMP 04/16/2016 (Exact Date)   SpO2 97%   BMI 43.43 kg/m     General appearance: alert, cooperative and appears stated age  EMB: Consent for procedure. Sterile prep with hibiclens . Paracervical block with 1% lidocaine 10 cc, lot number    [id number] , expiration 08/2025 Tenaculum to anterior cervical lip. Pipelle passed to     8     cm twice.   Tissue to pathology.  Silver nitrate used at tenaculum site of anterior cervix.  Minimal EBL. No complications.   Chaperone was present for exam:  Warren Lacy, CMA  ASSESSMENT:  Postmenopausal bleeding.  Thickened  endometrium.  PLAN:  Pelvic US reviewed.   FU EMB.  Possible sonohysterogram discussed if bx is negative.  Restart Eliquis tonight.   20 min  total time was spent for this patient encounter, including preparation, face-to-face counseling with the patient, coordination of care, and documentation of the encounter in addition to performing the endometrial biopsy.

## 2023-11-30 ENCOUNTER — Other Ambulatory Visit: Payer: Self-pay | Admitting: Cardiology

## 2023-11-30 ENCOUNTER — Other Ambulatory Visit: Payer: Self-pay | Admitting: General Practice

## 2023-12-03 ENCOUNTER — Encounter: Payer: Self-pay | Admitting: Cardiology

## 2023-12-04 ENCOUNTER — Encounter: Payer: Self-pay | Admitting: Obstetrics and Gynecology

## 2023-12-04 ENCOUNTER — Ambulatory Visit (INDEPENDENT_AMBULATORY_CARE_PROVIDER_SITE_OTHER): Payer: Medicare Other | Admitting: Obstetrics and Gynecology

## 2023-12-04 ENCOUNTER — Other Ambulatory Visit (HOSPITAL_COMMUNITY)
Admission: RE | Admit: 2023-12-04 | Discharge: 2023-12-04 | Disposition: A | Discharge status: Home / Self Care | Payer: Medicare Other | Source: Ambulatory Visit | Attending: Obstetrics and Gynecology | Admitting: Obstetrics and Gynecology

## 2023-12-04 VITALS — BP 128/74 | HR 63 | Ht 65.5 in | Wt 265.0 lb

## 2023-12-04 DIAGNOSIS — N950 Postmenopausal bleeding: Secondary | ICD-10-CM | POA: Insufficient documentation

## 2023-12-04 DIAGNOSIS — R9389 Abnormal findings on diagnostic imaging of other specified body structures: Secondary | ICD-10-CM

## 2023-12-04 MED ORDER — METOPROLOL TARTRATE 25 MG PO TABS: 25.0000 mg | ORAL_TABLET | Freq: Two times a day (BID) | ORAL | 3 refills | Status: DC

## 2023-12-04 NOTE — Patient Instructions (Signed)
 Endometrial Biopsy  An endometrial biopsy is a procedure where a tissue sample is removed from the lining of the uterus. This lining is called the endometrium. The tissue sample is then sent to a lab for testing. You may have this type of biopsy to check for: Cancer. Infection. Growths called polyps. Uterine bleeding that can't be explained. Tell a health care provider about: Any allergies you have. All medicines you're taking including vitamins, herbs, eye drops, creams, and over-the-counter medicines. Any problems you or family members have had with anesthesia. Any bleeding problems you have. Any surgeries you have had. Any medical problems you have. Whether you're pregnant or may be pregnant. What are the risks? Your health care provider will talk with you about risks. These may include: Bleeding. Infection. Allergic reactions to medicines. Damage to the wall of the uterus. This is rare. What happens before the procedure? Keep track of your period. You may need to have this biopsy when you're not having your period. Ask your provider about: Changing or stopping your regular medicines. These include any diabetes medicines or blood thinners you take. Taking medicines such as aspirin and ibuprofen. These medicines can thin your blood. Do not take them unless your provider tells you to. Taking over-the-counter medicines, vitamins, herbs, and supplements. Bring a pad with you. You may need to wear one after the biopsy. Plan to have a responsible adult take you home from the hospital or clinic. You won't be allowed to drive. What happens during the procedure? A tool will be put into your vagina to hold it open. This helps your provider see the cervix. The cervix is the lowest part of the uterus. Your cervix will be cleaned with a solution that kills germs. You will be given anesthesia. This keeps you from feeling pain. It will numb your cervix. A tool called forceps will be used to  hold your cervix steady. A thin tool called a uterine sound will be put through your cervix. It will be used to: Find the length of your uterus. Find where to take the sample from. A soft tube called a catheter will be put into your uterus. The catheter will remove a tissue sample. The tube and tools will be removed. The sample will be sent to a lab for testing. The procedure may vary among providers and hospitals. What happens after the procedure? Your blood pressure, heart rate, breathing rate, and blood oxygen level will be monitored until you leave the hospital or clinic. It's up to you to get the results of your procedure. Ask your provider, or the department that is doing the procedure, when your results will be ready. This information is not intended to replace advice given to you by your health care provider. Make sure you discuss any questions you have with your health care provider. Document Revised: 02/21/2023 Document Reviewed: 02/21/2023 Elsevier Patient Education  2024 [employer].

## 2023-12-05 ENCOUNTER — Ambulatory Visit (INDEPENDENT_AMBULATORY_CARE_PROVIDER_SITE_OTHER): Payer: Medicare Other | Admitting: Internal Medicine

## 2023-12-05 ENCOUNTER — Other Ambulatory Visit: Payer: Self-pay | Admitting: *Deleted

## 2023-12-05 VITALS — BP 111/64 | HR 56 | Temp 98.2°F | Ht 65.0 in | Wt 264.8 lb

## 2023-12-05 DIAGNOSIS — K219 Gastro-esophageal reflux disease without esophagitis: Secondary | ICD-10-CM | POA: Diagnosis not present

## 2023-12-05 DIAGNOSIS — K900 Celiac disease: Secondary | ICD-10-CM

## 2023-12-05 DIAGNOSIS — R7989 Other specified abnormal findings of blood chemistry: Secondary | ICD-10-CM

## 2023-12-05 NOTE — Patient Instructions (Signed)
It was good to see you again today!  Excellent job on control of celiac disease!  Continue Protonix 40 mg daily best taken 30 minutes before breakfast  GERD information provided today  Hepatic function profile today  Plan for a screening colonoscopy 2026  Unless something comes up, plan to see back in the office in 1 year.

## 2023-12-05 NOTE — Progress Notes (Unsigned)
Primary Care Physician:  Benita Stabile, MD Primary Gastroenterologist:  Dr. Jena Gauss  Pre-Procedure History & Physical: HPI:  Maria Meza is a 67 y.o. female here for   Follow-up epigastric right upper quadrant abdominal pain disease.  EGD recently demonstrated moderate erosive reflux esophagitis.  Started on Protonix 40 mg daily this been associated with resolution of her epigastric right upper quadrant abdominal pain biopsies of her small bowel demonstrate normal.  Histology indicating excellent control of reflux.  Overall very happy with her progress.  Notes from [facility].  Negative colonoscopy 2016; due for average rescreening 2026.  Past Medical History:  Diagnosis Date   Absolute anemia    No longer on iron supplementation.   Adult celiac disease 2018   Anxiety    Arrhythmia 06/2016   Event monitor has shown PVCs, PSVT at 1 short burst of NSVT.;  Event monitor from March 2021: 6 short runs of wide-complex tachycardia and 25 runs  of PSVT--intolerant of high-dose beta-blocker because of bradycardia.   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  (ANA positive, dsDNA negative)   Dysmenorrhea    Dysrhythmia    Elevated LFTs    Fatty liver    GERD (gastroesophageal reflux disease)    Glaucoma    History of COVID-19 02/19/2020   Hypothyroidism    IBS (irritable bowel syndrome)    Morbid obesity with BMI of 40.0-44.9, adult (HCC) 09/2020   BMI 43.07   MRSA infection 4-end-21   on abdomen   OSA on CPAP    Pulmonary hypertension (HCC)    PVC's (premature ventricular contractions)    Thyroid disease    hypothyroidism   TIA (transient ischemic attack)    2 per patient   Urinary incontinence    with sneezing, coughing    Past Surgical History:  Procedure Laterality Date   BIOPSY  11/01/2023   Procedure: BIOPSY;  Surgeon: Corbin Ade, MD;  [facility];  Service: Endoscopy;;   CARDIAC EVENT MONITOR  06/2016   [facility]) Noted PVCs, PSVT and NSVT (1 episode of 20 beats). ->  PT evaluation suggested PVCs appear to have been completely interpolated.  Felt to be septal   CARDIAC MRI -ADENOSINE / STRESS  05/28/2020   [facility]): EF 69%. Normal LV size, thickness and function w/ NO RWMA  CO & CI 6.5 L/min & 3 L/min.      Normal RV size thickness and function.  No RV WMA or aneurysm.  No findings c/w  ARVC.  Both atria are mildly enlarged.  Trileaflet aortic valve with no evidence of stenosis.  Adenosine Stress Imaging: no  inducible myocardial ischemia OR prior infarction/scar or infiltrate. Normal AoV. Mid TR.   CARDIOVERSION N/A 09/14/2022   Procedure: CARDIOVERSION;  Surgeon: Jake Bathe, MD;  Location: [facility];  Service: Cardiovascular;  Laterality: N/A;   CATARACT  EXTRACTION Right 03/2018   CATARACT EXTRACTION Left 03/2018   CHOLECYSTECTOMY     ESOPHAGOGASTRODUODENOSCOPY (EGD) WITH PROPOFOL N/A 11/01/2023   Procedure: ESOPHAGOGASTRODUODENOSCOPY (EGD) WITH PROPOFOL;  Surgeon: Corbin Ade, MD;  [facility]: [facility];  Service: Endoscopy;  Laterality: N/A;  1245PM, ASA 3   LAPAROSCOPIC TUBAL LIGATION  1986   NUCLEAR STRESS TEST  05/2014   EF 55%.  Mixed anteroseptal defect consistent with breast attenuation artifact.   TEE WITHOUT CARDIOVERSION N/A 09/14/2022   Procedure: TRANSESOPHAGEAL ECHOCARDIOGRAM (TEE);  Surgeon: Jake Bathe, MD;  Location: [facility];  Service: Cardiovascular;  Laterality: N/A;   TRANSTHORACIC ECHOCARDIOGRAM  08/2016   [facility]- Normal LV size and function-EF~55%.  No RWMA..  Normal LA pressures.  Normal RV function.  Trivial AR, PR and TR.  No stenoses.    TRANSTHORACIC ECHOCARDIOGRAM  02/14/2022   Difficult images.  Normal EF 60 to 65%.  No RWMA.  Normal diastolic parameters.  Normal RV size  and function.  Mild to moderate LA dilation.  Normal aortic and mitral valves.  Acing aorta measured roughly 38 mm.   ZIO PATCH CARDIAC EVENT MONITOR  02/2020   Predom Rhytym: SR - min 46 - max 113 bpm, avg 64 bpm. Rare isolated PVC & PACs. 6 short NSVT runs (8-11 beats, 134-245 bpm); 25 runs of  PAT/SVT - fastest 9 beats (200 bpm), longest 10.6 sec (130 bpm). Noted Sx w/ PAT & NSVT during daylight hours, and only once at midnight.   ZIO PATCH MONITOR  01/2022   Predominant SR: Rate range 46-99 bpm, avg 61 bpm.  Rare PACs & PVCs (& rare couplets).  No bigeminy/trigeminy.  (Sx noted w/ PACs and PVCs - & a few Atrial Runs).  35 Atrial Runs + 1 "V. tach "run of 4 Narrow Complex beats; fastest atrial run 5 beats @ 193 bpm & longest 22 beats (12.1 sec) @ avg 108 bpm.    Prior to Admission medications   Medication Sig Start Date End Date Taking? Authorizing Provider  apixaban (ELIQUIS) 5 MG TABS tablet Take 1 tablet (5 mg total) by mouth 2 (two) times  daily. 09/15/22  Yes Tat, Onalee Hua, MD  Ascorbic Acid (VITAMIN C) 1000 MG tablet Take 1,000 mg by mouth daily.   Yes [provider]  b complex vitamins tablet Take by mouth daily.   Yes [provider]  cholecalciferol (VITAMIN D3) 25 MCG (1000 UNIT) tablet Take 400 mcg by mouth daily.   Yes [provider]  folic acid (FOLVITE) 1 MG tablet Take 2 mg by mouth daily. 2 tablets daily 04/21/11  Yes [provider]  latanoprost (XALATAN) 0.005 % ophthalmic solution Place 1 drop into both eyes daily.    Yes [provider]  levothyroxine (SYNTHROID) 137 MCG tablet Take 137 mcg by mouth daily. 09/15/23  Yes [provider]  magnesium oxide (MAG-OX) 400 MG tablet TAKE 1 TABLET BY MOUTH TWICE A DAY 12/04/23  Yes Marykay Lex, MD  metoprolol tartrate (LOPRESSOR) 25 MG tablet TAKE 1 TABLET BY MOUTH TWICE A DAY 12/04/23  Yes Marykay Lex, MD  metoprolol tartrate (LOPRESSOR) 25 MG tablet Take 1 tablet (25 mg total) by mouth 2 (two) times daily. 12/04/23  Yes Marykay Lex, MD  pantoprazole (PROTONIX) 40 MG tablet Take 1 tablet (40 mg total) by mouth daily. 11/02/23  Yes Treylen Gibbs, Gerrit Friends, MD  mometasone (ELOCON) 0.1 % lotion Apply 1 application topically. Patient not taking: Reported on 12/05/2023    [provider]    Allergies as of 12/05/2023 - Review Complete 12/05/2023  Allergen Reaction Noted   Amiodarone Other (See Comments) 12/11/2014   Latex Other (See Comments) 09/09/2022   Propafenone  05/10/2017   Gluten meal  08/01/2019   Hydrocodone Nausea And Vomiting 11/24/2014   Propafenone hcl  09/29/2017   Verapamil  10/11/2020    Family History  Problem Relation Age of Onset   Heart attack Father        dec age 92   Hypertension Father    Hypertension Mother    Hyperlipidemia Mother    Migraines Mother    Seizures Mother        epilepsy   Stroke Mother        TIA's   Thyroid disease Sister        hypothyroid   Thyroid  disease Sister        hypothyroid   Rheum arthritis Maternal Grandmother  Migraines Maternal Grandmother    Cancer Maternal Grandfather 76       colon ca--DEC age 44   Diabetes Maternal Grandfather    Stroke Paternal Grandmother        multiple   Thyroid disease Paternal Grandmother        goiter-hypothyroid   Breast cancer Maternal Aunt     Social History   Socioeconomic History   Marital status: Married    Spouse name: Not on file   Number of children: Not on file   Years of education: Not on file   Highest education level: Not on file  Occupational History   Not on file  Tobacco Use   Smoking status: Never   Smokeless tobacco: Never   Tobacco comments:    Never smoke 11/28/22  Vaping Use   Vaping status: Never Used  Substance and Sexual Activity   Alcohol use: No    Alcohol/week: 0.0 standard drinks of alcohol   Drug use: No   Sexual activity: Not Currently    Partners: Male    Birth control/protection: Post-menopausal    Comment: less than 5, after IC, no hx of STD, no abnormal pap, no DES  Other Topics Concern   Not on file  Social History Narrative   She lives in [place], [place]   Right handed    Caffeine use: 1 cup coffee every morning   Social Determinants of Health   Financial Resource Strain: Not on file  Food Insecurity: No Food Insecurity (09/10/2022)   Hunger Vital Sign    Worried About Running Out of Food in the Last Year: Never true    Ran Out of Food in the Last Year: Never true  Transportation Needs: No Transportation Needs (09/10/2022)   PRAPARE - Administrator, Civil Service (Medical): No    Lack of Transportation (Non-Medical): No  Physical Activity: Not on file  Stress: Not on file  Social Connections: Not on file  Intimate Partner Violence: Not At Risk (09/10/2022)   Humiliation, Afraid, Rape, and Kick questionnaire    Fear of Current or Ex-Partner: No    Emotionally Abused: No    Physically Abused: No    Sexually  Abused: No    Review of Systems: See HPI, otherwise negative ROS  Physical Exam: BP 111/64 (BP Location: Right Arm, Patient Position: Sitting, Cuff Size: Large)   Pulse (!) 56   Temp 98.2 F (36.8 C) (Temporal)   Ht 5\' 5"  (1.651 m)   Wt 264 lb 12.8 oz (120.1 kg)   LMP 04/16/2016 (Exact Date)   BMI 44.07 kg/m  General:   Alert,  Well-developed, well-nourished, pleasant and cooperative in NAD Skin:  Intact without significant lesions or rashes. Eyes:  Sclera clear, no icterus.   Conjunctiva pink. Ears:  Normal auditory acuity. Nose:  No deformity, discharge,  or lesions. Mouth:  No deformity or lesions. Neck:  Supple; no masses or thyromegaly. No significant cervical adenopathy. Lungs:  Clear throughout to auscultation.   No wheezes, crackles, or rhonchi. No acute distress. Heart:  Regular rate and rhythm; no murmurs, clicks, rubs,  or gallops. Abdomen: Non-distended, normal bowel sounds.  Soft and nontender without appreciable mass or hepatosplenomegaly.  Pulses:  Normal pulses noted. Extremities:  Without clubbing or edema.  Impression/Plan:  ***     Notice: This dictation was prepared with Dragon dictation along with smaller phrase technology. Any transcriptional errors that result from this process are unintentional and may not be corrected  upon review.

## 2023-12-06 LAB — HEPATIC FUNCTION PANEL
ALT: 41 [IU]/L — ABNORMAL HIGH (ref 0–32)
AST: 27 [IU]/L (ref 0–40)
Albumin: 4.4 g/dL (ref 3.9–4.9)
Alkaline Phosphatase: 84 [IU]/L (ref 44–121)
Bilirubin Total: 0.4 mg/dL (ref 0.0–1.2)
Bilirubin, Direct: 0.15 mg/dL (ref 0.00–0.40)
Total Protein: 6.5 g/dL (ref 6.0–8.5)

## 2023-12-06 LAB — SURGICAL PATHOLOGY

## 2023-12-08 ENCOUNTER — Telehealth: Payer: Self-pay | Admitting: Obstetrics and Gynecology

## 2023-12-08 DIAGNOSIS — R9389 Abnormal findings on diagnostic imaging of other specified body structures: Secondary | ICD-10-CM

## 2023-12-08 DIAGNOSIS — N950 Postmenopausal bleeding: Secondary | ICD-10-CM

## 2023-12-15 ENCOUNTER — Telehealth: Payer: Self-pay | Admitting: Cardiology

## 2023-12-15 ENCOUNTER — Other Ambulatory Visit: Payer: Self-pay

## 2023-12-15 DIAGNOSIS — R7989 Other specified abnormal findings of blood chemistry: Secondary | ICD-10-CM

## 2023-12-15 NOTE — Telephone Encounter (Signed)
Paper Work Dropped Off: Medication Renewal Application  Date: 12/15/2023    Location of paper: Provider Mailbox

## 2023-12-18 NOTE — Telephone Encounter (Signed)
Sonohysteregram is scheduled for Jan.

## 2023-12-26 NOTE — Telephone Encounter (Signed)
Faxed information to patient assistance

## 2023-12-26 NOTE — Telephone Encounter (Signed)
 Patient aware of faxed forms

## 2024-01-09 ENCOUNTER — Encounter: Payer: Self-pay | Admitting: Cardiology

## 2024-01-10 NOTE — Telephone Encounter (Signed)
 Spoke with patient.  She is on [employer] [phone]) with a $545 up front deductible.  She understands that we don't have any resources to cover this cost.  She and her husband both get Eliquis , free from [employer] once they pay the 3% OOP.  They will do so and re-file the claim with [employer].

## 2024-01-10 NOTE — Telephone Encounter (Signed)
 Alternatives are typically Xarelto and to a lesser extent Pradaxa and Savaysa which we do not use very often.  Would need to do a background check on her insurance to see which 1 would be covered  Randene Bustard, MD

## 2024-01-11 NOTE — Progress Notes (Signed)
GYNECOLOGY  VISIT   HPI: 68 y.o.   Married  Caucasian female   G2P2002 with Patient's last menstrual period was 04/16/2016 (exact date).   here for: sonohysterogram and repeat endometrial biopsy for postmenopausal bleeding, thickened endometrium of 10 mm, and an endometrial biopsy on 12/04/23 showing inactive endometrium negative for hyperplasia or malignancy.    Prior US 12/04/23 showed EMS 10 mm, bowel obscuring the uterine fundus, and nonvisualized ovaries.   Skipped her Eliquis last hs and this am.     Had episode of atrial fibrillation this month and had cardioversion.   Has upcoming follow up appt.   GYNECOLOGIC HISTORY: Patient's last menstrual period was 04/16/2016 (exact date). Contraception:  PMP Menopausal hormone therapy:  n/a Last 2 paps:  10/16/23 - Neg, neg HR HPV, 06/25/21 neg,  History of abnormal Pap or positive HPV:  no Mammogram:  10/16/23 - BI-RADS1, cat A density.         OB History     Gravida  2   Para  2   Term  2   Preterm      AB      Living  2      SAB      IAB      Ectopic      Multiple      Live Births                 Patient Active Problem List   Diagnosis Date Noted   Right flank pain 07/19/2023   RUQ pain 07/19/2023   Celiac disease 07/19/2023   GERD (gastroesophageal reflux disease) 07/19/2023   Elevated LFTs 07/19/2023   Hypercoagulable state due to persistent atrial fibrillation (HCC) 11/28/2022   Obesity, Class III, BMI 40-49.9 (morbid obesity) (HCC) 09/14/2022   Persistent atrial fibrillation (HCC) 09/09/2022   Nonsustained ventricular tachycardia (HCC) very short bursts; negative ischemic evaluation. 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   Adult celiac disease 2018   Anxiety    Arrhythmia 06/2016   Event monitor has shown PVCs, PSVT at 1 short burst of NSVT.;  Event monitor from March 2021: 6 short runs of wide-complex tachycardia and 25 runs of PSVT--intolerant of high-dose beta-blocker because of bradycardia.   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  (ANA positive, dsDNA negative)   Dysmenorrhea    Dysrhythmia    Elevated LFTs    Fatty liver    GERD (gastroesophageal reflux disease)    Glaucoma    History of COVID-19 02/19/2020   Hypothyroidism    IBS (irritable bowel syndrome)    Morbid obesity with BMI of 40.0-44.9, adult (HCC) 09/2020   BMI 43.07   MRSA infection 4-end-21   on abdomen   OSA on CPAP    Pulmonary hypertension (HCC)    PVC's (premature ventricular contractions)    Thyroid disease    hypothyroidism   TIA (transient ischemic attack)  2 per patient   Urinary incontinence    with sneezing, coughing    Past Surgical History:  Procedure Laterality Date   BIOPSY  11/01/2023   Procedure: BIOPSY;  Surgeon: Corbin Ade, MD;  [facility];  Service: Endoscopy;;   CARDIAC EVENT MONITOR  06/2016   [facility]) Noted PVCs, PSVT and NSVT (1 episode of 20 beats). ->  PT evaluation suggested PVCs appear to have been completely interpolated.  Felt to be septal   CARDIAC MRI -ADENOSINE / STRESS  05/28/2020   [facility]): EF 69%. Normal LV size, thickness and function w/ NO RWMA  CO & CI 6.5 L/min & 3 L/min.      Normal RV size thickness and function.  No RV WMA or aneurysm.  No findings c/w  ARVC.  Both atria are mildly enlarged.  Trileaflet aortic valve with no evidence of stenosis.  Adenosine Stress Imaging:  no  inducible myocardial ischemia OR prior infarction/scar or infiltrate. Normal AoV. Mid TR.   CARDIOVERSION N/A 09/14/2022   Procedure: CARDIOVERSION;  Surgeon: Jake Bathe, MD;  Location: [facility];  Service: Cardiovascular;  Laterality: N/A;   CATARACT EXTRACTION Right 03/2018   CATARACT EXTRACTION Left 03/2018   CHOLECYSTECTOMY     ESOPHAGOGASTRODUODENOSCOPY (EGD) WITH PROPOFOL N/A 11/01/2023   Procedure: ESOPHAGOGASTRODUODENOSCOPY (EGD) WITH PROPOFOL;  Surgeon: Corbin Ade, MD;  Location[facility];  Service: Endoscopy;  Laterality: N/A;  1245PM, ASA 3   LAPAROSCOPIC TUBAL LIGATION  1986   NUCLEAR STRESS TEST  05/2014   EF 55%.  Mixed anteroseptal defect consistent with breast attenuation artifact.   TEE WITHOUT CARDIOVERSION N/A 09/14/2022   Procedure: TRANSESOPHAGEAL ECHOCARDIOGRAM (TEE);  Surgeon: Jake Bathe, MD;  Location: [facility];  Service: Cardiovascular;  Laterality: N/A;   TRANSTHORACIC ECHOCARDIOGRAM  08/2016   [facility]- Normal LV size and function-EF~55%.  No RWMA..  Normal LA pressures.  Normal RV function.  Trivial AR, PR and TR.  No stenoses.    TRANSTHORACIC ECHOCARDIOGRAM  02/14/2022   Difficult images.  Normal EF 60 to 65%.  No RWMA.  Normal diastolic parameters.  Normal RV size and function.  Mild to moderate LA dilation.  Normal aortic and mitral valves.  Acing aorta measured roughly 38 mm.   ZIO PATCH CARDIAC EVENT MONITOR  02/2020   Predom Rhytym: SR - min 46 - max 113 bpm, avg 64 bpm. Rare isolated PVC & PACs. 6 short NSVT runs (8-11 beats, 134-245 bpm); 25 runs of  PAT/SVT - fastest 9 beats (200 bpm), longest 10.6 sec (130 bpm). Noted Sx w/ PAT & NSVT during daylight hours, and only once at midnight.   ZIO PATCH MONITOR  01/2022   Predominant SR: Rate range 46-99 bpm, avg 61 bpm.  Rare PACs & PVCs (& rare couplets).  No bigeminy/trigeminy.  (Sx noted w/ PACs and PVCs - & a few Atrial Runs).  35 Atrial Runs + 1 "V. tach "run of 4 Narrow Complex beats; fastest atrial  run 5 beats @ 193 bpm & longest 22 beats (12.1 sec) @ avg 108 bpm.    Current Outpatient Medications  Medication Sig Dispense Refill   apixaban (ELIQUIS) 5 MG TABS tablet Take 1 tablet (5 mg total) by mouth 2 (two) times daily. 60 tablet 1   Ascorbic Acid (VITAMIN C) 1000 MG tablet Take 1,000 mg by mouth daily.     b complex vitamins tablet Take by mouth daily.     cholecalciferol (VITAMIN D3) 25  MCG (1000 UNIT) tablet Take 400 mcg by mouth daily.     folic acid (FOLVITE) 1 MG tablet Take 2 mg by mouth daily. 2 tablets daily     latanoprost (XALATAN) 0.005 % ophthalmic solution Place 1 drop into both eyes daily.      levothyroxine (SYNTHROID) 137 MCG tablet Take 137 mcg by mouth daily.     magnesium oxide (MAG-OX) 400 MG tablet TAKE 1 TABLET BY MOUTH TWICE A DAY 180 tablet 3   metoprolol tartrate (LOPRESSOR) 25 MG tablet TAKE 1 TABLET BY MOUTH TWICE A DAY 180 tablet 3   metoprolol tartrate (LOPRESSOR) 25 MG tablet Take 1 tablet (25 mg total) by mouth 2 (two) times daily. 180 tablet 3   mometasone (ELOCON) 0.1 % lotion Apply 1 application  topically.     pantoprazole (PROTONIX) 40 MG tablet Take 1 tablet (40 mg total) by mouth daily. 30 tablet 11   No current facility-administered medications for this visit.     ALLERGIES: Amiodarone, Latex, Propafenone, Gluten meal, Hydrocodone, Propafenone hcl, and Verapamil  Family History  Problem Relation Age of Onset   Heart attack Father        dec age 64   Hypertension Father    Hypertension Mother    Hyperlipidemia Mother    Migraines Mother    Seizures Mother        epilepsy   Stroke Mother        TIA's   Thyroid disease Sister        hypothyroid   Thyroid disease Sister        hypothyroid   Rheum arthritis Maternal Grandmother    Migraines Maternal Grandmother    Cancer Maternal Grandfather 102       colon ca--DEC age 62   Diabetes Maternal Grandfather    Stroke Paternal Grandmother        multiple   Thyroid disease Paternal  Grandmother        goiter-hypothyroid   Breast cancer Maternal Aunt     Social History   Socioeconomic History   Marital status: Married    Spouse name: Not on file   Number of children: Not on file   Years of education: Not on file   Highest education level: Not on file  Occupational History   Not on file  Tobacco Use   Smoking status: Never   Smokeless tobacco: Never   Tobacco comments:    Never smoke 11/28/22  Vaping Use   Vaping status: Never Used  Substance and Sexual Activity   Alcohol use: No    Alcohol/week: 0.0 standard drinks of alcohol   Drug use: No   Sexual activity: Not Currently    Partners: Male    Birth control/protection: Post-menopausal    Comment: less than 5, after IC, no hx of STD, no abnormal pap, no DES  Other Topics Concern   Not on file  Social History Narrative   She lives in [place], [place]   Right handed    Caffeine use: 1 cup coffee every morning   Social Drivers of Health   Financial Resource Strain: Not on file  Food Insecurity: No Food Insecurity (09/10/2022)   Hunger Vital Sign    Worried About Running Out of Food in the Last Year: Never true    Ran Out of Food in the Last Year: Never true  Transportation Needs: No Transportation Needs (09/10/2022)   PRAPARE - Administrator, Civil Service (Medical): No  Lack of Transportation (Non-Medical): No  Physical Activity: Not on file  Stress: Not on file  Social Connections: Not on file  Intimate Partner Violence: Not At Risk (09/10/2022)   Humiliation, Afraid, Rape, and Kick questionnaire    Fear of Current or Ex-Partner: No    Emotionally Abused: No    Physically Abused: No    Sexually Abused: No    Review of Systems   See HPI.  PHYSICAL EXAMINATION:   BP 130/74 (BP Location: Left Arm, Patient Position: Sitting, Cuff Size: Large)   Pulse (!) 57   Ht 5\' 5"  (1.651 m)   Wt 264 lb (119.7 kg)   LMP 04/16/2016 (Exact Date)   SpO2 96%   BMI 43.93 kg/m     General  appearance: alert, cooperative and appears stated age  Pelvic US Uterus:  8.99 x 4.56 x 4.49 cm.  No myometrial masses. EMS:  6.55 mm.  Echogenic area noted.  Left ovary:  1.86 x 1.26 x 1.04 cm.  Normal.  Right ovary:  1.59 x 1.46 x 0.89 cm.   Normal. No adnexal massed No free fluid  Sonohysterogram Consent done.  Sterile prep with Hibiclens.  Cannula placed through cervical opening, into the endometrial cavity.   Normal saline injected.  Filling defect noted:  12 x 5 x 11 mm.   No complications.  Minimal EBL.   ASSESSMENT:  Postmenopausal bleeding.  Thickened endometrium with endometrial mass.  Prior negative endometrial biopsy.  Atrial fibrillation.  Hx TIA. Umbilical hernia.   PLAN:  Pelvic US and sonohysterogram images reviewed.  We discussed repeat endometrial biopsy versus going directly to hysteroscopy with resection of endometrial mass and dilation and curettage.  We talked about the possibility again of a false negative endometrial biopsy today if it were repeated.  Patient opts for hysteroscopy with resection of endometrial mass and dilation and curettage.  Will refer to surgical colleague at [facility]. Ok to restart Eliquis tonight. Seeing cardiology 01/30/24 for follow up and she will mention upcoming GYN care.   28 min  total time was spent for this patient encounter, including preparation, face-to-face counseling with the patient, coordination of care, and documentation of the encounter.

## 2024-01-17 ENCOUNTER — Other Ambulatory Visit: Payer: Self-pay

## 2024-01-17 ENCOUNTER — Encounter (HOSPITAL_COMMUNITY): Payer: Self-pay

## 2024-01-17 ENCOUNTER — Emergency Department (HOSPITAL_COMMUNITY)
Admission: EM | Admit: 2024-01-17 | Discharge: 2024-01-17 | Disposition: A | Discharge status: Home / Self Care | Payer: Medicare Other | Attending: Emergency Medicine | Admitting: Emergency Medicine

## 2024-01-17 DIAGNOSIS — Z91040 Latex allergy status: Secondary | ICD-10-CM | POA: Insufficient documentation

## 2024-01-17 DIAGNOSIS — R002 Palpitations: Secondary | ICD-10-CM | POA: Diagnosis present

## 2024-01-17 DIAGNOSIS — Z7901 Long term (current) use of anticoagulants: Secondary | ICD-10-CM | POA: Insufficient documentation

## 2024-01-17 DIAGNOSIS — I480 Paroxysmal atrial fibrillation: Secondary | ICD-10-CM | POA: Diagnosis not present

## 2024-01-17 DIAGNOSIS — Z79899 Other long term (current) drug therapy: Secondary | ICD-10-CM | POA: Insufficient documentation

## 2024-01-17 LAB — CBC
HCT: 45.4 % (ref 36.0–46.0)
Hemoglobin: 15 g/dL (ref 12.0–15.0)
MCH: 30.9 pg (ref 26.0–34.0)
MCHC: 33 g/dL (ref 30.0–36.0)
MCV: 93.6 fL (ref 80.0–100.0)
Platelets: 358 10*3/uL (ref 150–400)
RBC: 4.85 MIL/uL (ref 3.87–5.11)
RDW: 13.3 % (ref 11.5–15.5)
WBC: 11.5 10*3/uL — ABNORMAL HIGH (ref 4.0–10.5)
nRBC: 0 % (ref 0.0–0.2)

## 2024-01-17 LAB — BASIC METABOLIC PANEL
Anion gap: 9 (ref 5–15)
BUN: 17 mg/dL (ref 8–23)
CO2: 24 mmol/L (ref 22–32)
Calcium: 9.3 mg/dL (ref 8.9–10.3)
Chloride: 108 mmol/L (ref 98–111)
Creatinine, Ser: 0.79 mg/dL (ref 0.44–1.00)
GFR, Estimated: >60 mL/min (ref >60)
Glucose, Bld: 116 mg/dL — ABNORMAL HIGH (ref 70–99)
Potassium: 4.6 mmol/L (ref 3.5–5.1)
Sodium: 141 mmol/L (ref 135–145)

## 2024-01-17 LAB — MAGNESIUM: Magnesium: 2.3 mg/dL (ref 1.7–2.4)

## 2024-01-17 MED ORDER — DILTIAZEM HCL-DEXTROSE 125-5 MG/125ML-% IV SOLN (PREMIX)
5.0000 mg/h | INTRAVENOUS | Status: DC
  Administered 2024-01-17: 5 mg/h via INTRAVENOUS
  Filled 2024-01-17: qty 125

## 2024-01-17 MED ORDER — DILTIAZEM LOAD VIA INFUSION
10.0000 mg | Freq: Once | INTRAVENOUS | Status: AC
  Administered 2024-01-17: 10 mg via INTRAVENOUS
  Filled 2024-01-17: qty 10

## 2024-01-17 MED ORDER — PROPOFOL 10 MG/ML IV BOLUS
40.0000 mg | Freq: Once | INTRAVENOUS | Status: AC
  Administered 2024-01-17: 60 mg via INTRAVENOUS
  Filled 2024-01-17: qty 20

## 2024-01-17 NOTE — Progress Notes (Signed)
RT present for cardioversion. Patient placed on 2 lpm nasal cannula with ETCO2 monitoring. Patient tolerated procedure well and VSS throughout.

## 2024-01-17 NOTE — ED Triage Notes (Signed)
Afib started at 11 tonight. Hx of same. Pt reports tried everything at home and can when BP got high.

## 2024-01-17 NOTE — ED Provider Notes (Signed)
[place] EMERGENCY DEPARTMENT AT [facility] Provider Note   CSN: [id number] Arrival date & time: 01/17/24  4098     History  Chief complaint-palpitations   Maria Meza is a 68 y.o. female.  The history is provided by the patient and the spouse.  Patient presents for concerns for A-fib.  Patient reports around 11 PM several hours ago, she had sudden onset of A-fib.  She reports feeling palpitations and irregular heartbeat.  No significant chest pain or shortness of breath.  No syncope.  She has otherwise been at her baseline.  No vomiting or diarrhea.  She takes her medications regularly including Eliquis and she denies any recent missed medications     Home Medications Prior to Admission medications   Medication Sig Start Date End Date Taking? Authorizing Provider  apixaban (ELIQUIS) 5 MG TABS tablet Take 1 tablet (5 mg total) by mouth 2 (two) times daily. 09/15/22   Catarina Hartshorn, MD  Ascorbic Acid (VITAMIN C) 1000 MG tablet Take 1,000 mg by mouth daily.    [provider]  b complex vitamins tablet Take by mouth daily.    [provider]  cholecalciferol (VITAMIN D3) 25 MCG (1000 UNIT) tablet Take 400 mcg by mouth daily.    [provider]  folic acid (FOLVITE) 1 MG tablet Take 2 mg by mouth daily. 2 tablets daily 04/21/11   [provider]  latanoprost (XALATAN) 0.005 % ophthalmic solution Place 1 drop into both eyes daily.     [provider]  levothyroxine (SYNTHROID) 137 MCG tablet Take 137 mcg by mouth daily. 09/15/23   [provider]  magnesium oxide (MAG-OX) 400 MG tablet TAKE 1 TABLET BY MOUTH TWICE A DAY 12/04/23   Marykay Lex, MD  metoprolol tartrate (LOPRESSOR) 25 MG tablet TAKE 1 TABLET BY MOUTH TWICE A DAY 12/04/23   Marykay Lex, MD  metoprolol tartrate (LOPRESSOR) 25 MG tablet Take 1 tablet (25 mg total) by mouth 2 (two) times daily. 12/04/23   Marykay Lex, MD  mometasone (ELOCON) 0.1 %  lotion Apply 1 application topically. Patient not taking: Reported on 12/05/2023    [provider]  pantoprazole (PROTONIX) 40 MG tablet Take 1 tablet (40 mg total) by mouth daily. 11/02/23   Rourk, Gerrit Friends, MD      Allergies    Amiodarone, Latex, Propafenone, Gluten meal, Hydrocodone, Propafenone hcl, and Verapamil    Review of Systems   Review of Systems  Constitutional:  Negative for fever.  Cardiovascular:  Positive for palpitations. Negative for chest pain.  Neurological:  Negative for syncope.    Physical Exam Updated Vital Signs BP 114/61   Pulse 87   Temp 98.4 F (36.9 C)   Resp 19   LMP 04/16/2016 (Exact Date)   SpO2 95%  Physical Exam CONSTITUTIONAL: Elderly, no acute distress HEAD: Normocephalic/atraumatic ENMT: Mucous membranes moist NECK: supple no meningeal signs CV: Tachycardic and irregular LUNGS: Lungs are clear to auscultation bilaterally, no apparent distress ABDOMEN: soft, nontender NEURO: Pt is awake/alert/appropriate, moves all extremitiesx4.  No facial droop.   EXTREMITIES: pulses normal/equal, full ROM SKIN: warm, color normal PSYCH: no abnormalities of mood noted, alert and oriented to situation  ED Results / Procedures / Treatments   Labs (all labs ordered are listed, but only abnormal results are displayed) Labs Reviewed  CBC - Abnormal; Notable for the following components:      Result Value   WBC 11.5 (*)  All other components within normal limits  BASIC METABOLIC PANEL - Abnormal; Notable for the following components:   Glucose, Bld 116 (*)    All other components within normal limits  MAGNESIUM    EKG EKG Interpretation Date/Time:  Wednesday January 17 2024 00:39:53 EST Ventricular Rate:  128 PR Interval:    QRS Duration:  132 QT Interval:  328 QTC Calculation: 479 R Axis:   57  Text Interpretation: Atrial fibrillation Right bundle branch block Confirmed by Zadie Rhine ([id number]) on 01/17/2024 12:47:50 AM   EKG  Interpretation Date/Time:  Wednesday January 17 2024 03:28:20 EST Ventricular Rate:  68 PR Interval:  152 QRS Duration:  134 QT Interval:  394 QTC Calculation: 419 R Axis:   -76  Text Interpretation: Sinus rhythm RBBB and LAFB Confirmed by Zadie Rhine ([id number]) on 01/17/2024 3:33:27 AM         Radiology No results found.  Procedures .Cardioversion  Date/Time: 01/17/2024 3:01 AM  Performed by: Zadie Rhine, MD Authorized by: Zadie Rhine, MD   Consent:    Consent obtained:  Written   Consent given by:  Patient   Risks discussed:  Induced arrhythmia and pain   Alternatives discussed:  No treatment Pre-procedure details:    Cardioversion basis:  Emergent   Rhythm:  Atrial fibrillation   Electrode placement:  Anterior-posterior Patient sedated: Yes. Refer to sedation procedure documentation for details of sedation.  Attempt one:    Cardioversion mode:  Synchronous   Waveform:  Biphasic   Shock (Joules):  120   Shock outcome:  Conversion to normal sinus rhythm Post-procedure details:    Patient status:  Awake   Patient tolerance of procedure:  Tolerated well, no immediate complications .Sedation  Date/Time: 01/17/2024 3:02 AM  Performed by: Zadie Rhine, MD Authorized by: Zadie Rhine, MD   Consent:    Consent obtained:  Written   Consent given by:  Patient   Risks discussed:  Respiratory compromise necessitating ventilatory assistance and intubation, vomiting, nausea and dysrhythmia Universal protocol:    Immediately prior to procedure, a time out was called: yes   Indications:    Procedure performed:  Cardioversion   Procedure necessitating sedation performed by:  Physician performing sedation Pre-sedation assessment:    Time since last food or drink:  8 hours   ASA classification: class 2 - patient with mild systemic disease     Mouth opening:  3 or more finger widths   Mallampati score:  I - soft palate, uvula, fauces, pillars visible    Neck mobility: normal     Pre-sedation assessments completed and reviewed: airway patency, mental status and respiratory function   A pre-sedation assessment was completed prior to the start of the procedure Immediate pre-procedure details:    Reassessment: Patient reassessed immediately prior to procedure     Reviewed: vital signs and NPO status     Verified: bag valve mask available, emergency equipment available, IV patency confirmed and oxygen available   Procedure details (see MAR for exact dosages):    Preoxygenation:  Nasal cannula   Sedation:  Propofol   Intended level of sedation: deep   Intra-procedure monitoring:  Blood pressure monitoring, cardiac monitor, frequent LOC assessments, frequent vital sign checks and continuous pulse oximetry   Intra-procedure events: none     Total Provider sedation time (minutes):  17 Post-procedure details:   A post-sedation assessment was completed following the completion of the procedure.   Attendance: Constant attendance by certified staff until patient recovered  Recovery: Patient returned to pre-procedure baseline     Post-sedation assessments completed and reviewed: mental status and respiratory function     Patient is stable for discharge or admission: yes     Procedure completion:  Tolerated well, no immediate complications     Medications Ordered in ED Medications  diltiazem (CARDIZEM) 1 mg/mL load via infusion 10 mg (10 mg Intravenous Bolus from Bag 01/17/24 0151)  propofol (DIPRIVAN) 10 mg/mL bolus/IV push 40 mg (60 mg Intravenous Given 01/17/24 0325)    ED Course/ Medical Decision Making/ A&P Clinical Course as of 01/17/24 0436  Wed Jan 17, 2024  0302 Patient with history of paroxysmal atrial fibrillation already on Eliquis presents for recurrent episodes since 11 PM Patient given Cardizem without any improvement in her rhythm though did have improvement in her heart rate.  Patient is had good success previously with the ED  cardioversion.  We had extensive discussion about the risk and benefits of sedation and cardioversion and patient agrees to proceed [DW]  0335 Patient tolerated sedation and cardioversion without difficulty.  She is now back in sinus rhythm [DW]  0436 Patient back to baseline, no acute complaints.  Back in sinus rhythm.  She was encouraged to continue her Eliquis as prescribed, follow-up with cardiology [DW]    Clinical Course User Index [DW] Zadie Rhine, MD                                 Medical Decision Making Amount and/or Complexity of Data Reviewed Labs: ordered.  Risk Prescription drug management.   This patient presents to the ED for concern of palpitations, this involves an extensive number of treatment options, and is a complaint that carries with it a high risk of complications and morbidity.  The differential diagnosis includes but is not limited to atrial fibrillation, atrial flutter, SVT, ventricular tachycardia, sinus tachycardia, anxiety  Comorbidities that complicate the patient evaluation: Patient's presentation is complicated by their history of paroxysmal atrial fibrillation  Social Determinants of Health: Patient's  financial burden with medication   increases the complexity of managing their presentation  Additional history obtained: Records reviewed previous admission documents  Lab Tests: I Ordered, and personally interpreted labs.  The pertinent results include: Leukocytosis   Cardiac Monitoring: The patient was maintained on a cardiac monitor.  I personally viewed and interpreted the cardiac monitor which showed an underlying rhythm of:  Atrial Fibrillation  Medicines ordered and prescription drug management: I ordered medication including Cardizem for A-fib Reevaluation of the patient after these medicines showed that the patient    stayed the same   Critical Interventions:   cardioversion to sinus rhythm   Reevaluation: After the  interventions noted above, I reevaluated the patient and found that they have :improved  Complexity of problems addressed: Patient's presentation is most consistent with  acute presentation with potential threat to life or bodily function  Disposition: After consideration of the diagnostic results and the patient's response to treatment,  I feel that the patent would benefit from discharge   .           Final Clinical Impression(s) / ED Diagnoses Final diagnoses:  Paroxysmal atrial fibrillation [facility])    Rx / DC Orders ED Discharge Orders          Ordered    Ambulatory referral to Cardiology        01/17/24 0301  Zadie Rhine, MD 01/17/24 [phone]

## 2024-01-24 ENCOUNTER — Encounter: Payer: Self-pay | Admitting: Obstetrics and Gynecology

## 2024-01-24 NOTE — Telephone Encounter (Signed)
Patient was called & verbally notified to not take eliquis tonight or in the morning.

## 2024-01-25 ENCOUNTER — Ambulatory Visit: Payer: Medicare Other

## 2024-01-25 ENCOUNTER — Other Ambulatory Visit: Payer: Self-pay | Admitting: Obstetrics and Gynecology

## 2024-01-25 ENCOUNTER — Encounter: Payer: Self-pay | Admitting: Obstetrics and Gynecology

## 2024-01-25 ENCOUNTER — Ambulatory Visit (INDEPENDENT_AMBULATORY_CARE_PROVIDER_SITE_OTHER): Payer: Medicare Other | Admitting: Obstetrics and Gynecology

## 2024-01-25 VITALS — BP 130/74 | HR 57 | Ht 65.0 in | Wt 264.0 lb

## 2024-01-25 DIAGNOSIS — R9389 Abnormal findings on diagnostic imaging of other specified body structures: Secondary | ICD-10-CM

## 2024-01-25 DIAGNOSIS — N950 Postmenopausal bleeding: Secondary | ICD-10-CM

## 2024-01-25 DIAGNOSIS — N9489 Other specified conditions associated with female genital organs and menstrual cycle: Secondary | ICD-10-CM

## 2024-01-25 NOTE — Patient Instructions (Signed)
Dilation and Curettage or Vacuum Curettage Dilation and curettage (D&C) and vacuum curettage are minor procedures. A D&C involves stretching the cervix (dilation) and scraping the inside lining of the uterus, or endometrium, with surgical instruments (curettage). During a D&C, tissue is gently scraped starting from the top of the uterus down to the lowest part of the uterus. During a vacuum curettage, the lining and tissue in the uterus are removed using gentle suction. Curettage may be performed to either diagnose or treat a problem. A diagnostic curettage may be done if you have: Irregular bleeding or clotting from the uterus. Spotting between menstrual periods, prolonged menstrual periods, or other abnormal bleeding. Bleeding after menopause. No menstrual period (amenorrhea). A change in the size and shape of the uterus. Abnormal endometrial cells discovered during a Pap test. For treatment, curettage may be done: To remove an IUD (intrauterine device). To remove the remaining placenta after giving birth. During an abortion or after a miscarriage. To remove growths in the lining of the uterus. To remove certain rare types of noncancerous lumps (fibroids). Tell a health care provider about: Any allergies you have, including allergies to prescribed medicine or latex. All medicines you are taking, including vitamins, herbs, eye drops, creams, and over-the-counter medicines. Any problems you or family members have had with anesthetic medicines. Any blood disorders you have. Any surgeries you have had. Your medical history and any medical conditions you have. Whether you are pregnant or may be pregnant. Recent vaginal infections you have had. Recent menstrual periods, bleeding problems you have had, and what form of birth control (contraception) you use. What are the risks? Generally, this is a safe procedure. However, problems may occur, including: Infection. Heavy vaginal  bleeding. Allergic reactions to medicines. Damage to the cervix or nearby structures or organs. Scar tissue developing inside the uterus. This can cause abnormal periods and may make it harder to get pregnant. A hole (perforation) in the wall of the uterus. This is rare. What happens before the procedure? Staying hydrated Follow instructions from your health care provider about hydration, which may include: Up to 2 hours before the procedure - you may continue to drink clear liquids, such as water, clear fruit juice, black coffee, and plain tea.  Eating and drinking restrictions Follow instructions from your health care provider about eating and drinking, which may include: 8 hours before the procedure - stop eating heavy meals or foods, such as meat, fried foods, or fatty foods. 6 hours before the procedure - stop eating light meals or foods, such as toast or cereal. 6 hours before the procedure - stop drinking milk or drinks that contain milk. 2 hours before the procedure - stop drinking clear liquids. If your health care provider told you to take your medicine on the day of your procedure, take them with only a sip of water. Medicines Ask your health care provider about: Changing or stopping your regular medicines. This is especially important if you are taking diabetes medicines or blood thinners. Taking medicines such as aspirin and ibuprofen. These medicines can thin your blood. Do not take these medicines unless your health care provider tells you to take them. Taking over-the-counter medicines, vitamins, herbs, and supplements. You may be given a medicine to soften the cervix. This will help with dilation. Tests You may be given a pregnancy test on the day of the procedure. You may have a blood or urine sample taken. General instructions Do not use any products that contain nicotine or tobacco  for at least 4 weeks before the procedure. These products include cigarettes, chewing  tobacco, and vaping devices, such as e-cigarettes. If you need help quitting, ask your health care provider. For 24 hours before your procedure: Do not douche, use tampons, or have sex. Do not use medicines, creams, or suppositories in the vagina. Ask your health care provider what steps will be taken to help prevent infection. These may include: Removing hair at the procedure site. Washing skin with a germ-killing soap. Taking antibiotic medicine. Plan to have a responsible adult take you home from the hospital or clinic. If you will be going home right after the procedure, plan to have a responsible adult care for you for the time you are told. This is important. What happens during the procedure?  An IV will be inserted into one of your veins. You will be given one of the following: A medicine that numbs the area in and around the cervix (local anesthetic). A medicine to make you fall asleep (general anesthetic). You will lie down on your back, with your feet in foot rests (stirrups). The size and position of your uterus will be checked. A lubricated instrument (speculum or Sims retractor) will be inserted into your vagina to widen its walls. This will allow your health care provider to see your cervix. Your cervix will be softened and dilated. This may be done by: Taking medicine by mouth or vaginally. Having thin rods or gradual widening instruments inserted into your cervix. A small, sharp, curved instrument (curette) will be used to scrape a small amount of tissue or cells from the endometrium or cervical canal. In some cases, gentle suction is applied with the curette. The cells will be taken to a lab for testing. The procedure may vary among health care providers and hospitals. What happens after the procedure? Your blood pressure, heart rate, breathing rate, and blood oxygen level will be monitored until you leave the hospital or clinic. You may have mild cramping, a backache,  pain, and light bleeding or spotting. You may pass small blood clots from your vagina. You may have to wear compression stockings. These stockings help to prevent blood clots and reduce swelling in your legs. It is up to you to get the results of your procedure. Ask your health care provider, or the department that is doing the procedure, when your results will be ready. Summary Dilation and curettage (D&C) involves stretching (dilating) the cervix and scraping the inside lining of the uterus with surgical instruments (curettage). Follow your health care provider's instructions about when to stop eating and drinking before the procedure, and whether to stop or change any medicines. After the procedure, you may have mild cramping, a backache, pain, and light bleeding or spotting. You may pass small blood clots from your vagina. Plan to have a responsible adult take you home from the hospital or clinic. This information is not intended to replace advice given to you by your health care provider. Make sure you discuss any questions you have with your health care provider. Document Revised: 11/30/2020 Document Reviewed: 12/02/2020 Elsevier Patient Education  2024 [employer]  Hysteroscopy Hysteroscopy is a procedure used to look inside a person's uterus. It should be done right after your period. You may have this procedure if your health care provider wants to: Look for tumors and other growths in the uterus. Know more about your bleeding, fibroids, tumors, polyps, scar tissue, or cancer in the uterus. Know why you aren't able to  get pregnant, or why you lose your pregnancies. Find an intrauterine device (IUD) in your body. Put a birth control device in the fallopian tubes. Tell a health care provider about: Any allergies you have. All medicines you are taking. These include vitamins, herbs, eye drops, creams, and over-the-counter medicines. Any problems you or family members have had with  anesthesia. Any bleeding problems you have. Any medical conditions or surgeries you've had. Whether you're pregnant or may be pregnant. Whether you have or have had a sexually transmitted infection (STI), or you think you have an STI. What are the risks? Your provider will talk with you about risks. These may include: Bleeding that's worse. Infection. Damage to parts of the uterus or other organs. Allergies to medicines or fluids that are used in the procedure. What happens before the procedure? When to stop eating and drinking Follow instructions from your provider about what you may eat and drink. These may include: 8 hours before your procedure Stop eating most foods. Do not eat meat, fried foods, or fatty foods. Eat only light foods, such as toast or crackers. All liquids are okay except energy drinks and alcohol. 6 hours before your procedure Stop eating. Drink only clear liquids, such as water, clear fruit juice, black coffee, plain tea, and sports drinks. Do not drink energy drinks or alcohol. 2 hours before your procedure Stop drinking all liquids. You may be allowed to take medicines with small sips of water. If you do not follow your provider's instructions, your procedure may be delayed or canceled. Medicines Ask your provider about: Changing or stopping your regular medicines. These include any diabetes medicines or blood thinners you take. Taking medicines such as aspirin and ibuprofen. These medicines can thin your blood. Do not take them unless your provider tells you to. Taking over-the-counter medicines, vitamins, herbs, and supplements. Medicine may be placed in your cervix the day before the procedure. This medicine causes the cervix to open (dilate). The larger opening makes it easier for the hysteroscope to be inserted into the uterus. General instructions Ask your provider what steps will be taken to help prevent infection. These steps may include: Washing skin  with a soap that kills germs. Taking antibiotic medicine. Do not smoke, vape, or use any products that have nicotine or tobacco for at least 4 weeks before the surgery. If you need help quitting, ask your provider. If you will be going home right after the procedure, plan to have an adult: Take you home from the hospital or clinic. You will not be allowed to drive. Care for you for the time you are told. Pee before the procedure begins. What happens during the procedure? You may be given: A sedative. This helps you relax. Anesthesia. This keeps you from feeling pain. It will make you fall asleep for surgery. A small tube with a light and camera called a hysteroscope will be put into your uterus. The camera sends images to a screen in the room so your provider can see the inside of your uterus. Air or fluid will be used to enlarge your uterus to allow your provider to see it better. In some cases, tissue may be gently scraped from inside the uterus and sent to a lab for testing (biopsy). The procedure may vary among providers and hospitals. What happens after the procedure? Your blood pressure, heart rate, breathing rate, and blood oxygen level will be monitored until you leave the hospital or clinic. You may have cramps. You may  be given medicines for this. You may have bleeding. Bleeding may be light or heavy. This is normal. If you had a biopsy, it is up to you to get the results. Ask your provider, or the department that is doing the procedure, when your results will be ready. This information is not intended to replace advice given to you by your health care provider. Make sure you discuss any questions you have with your health care provider. Document Revised: 04/14/2023 Document Reviewed: 04/14/2023 Elsevier Patient Education  2024 [employer].

## 2024-01-30 ENCOUNTER — Ambulatory Visit: Payer: Medicare Other | Attending: Cardiology | Admitting: Cardiology

## 2024-01-30 ENCOUNTER — Encounter: Payer: Self-pay | Admitting: Cardiology

## 2024-01-30 VITALS — BP 120/70 | HR 53 | Ht 65.0 in | Wt 258.6 lb

## 2024-01-30 DIAGNOSIS — D6869 Other thrombophilia: Secondary | ICD-10-CM

## 2024-01-30 DIAGNOSIS — G4733 Obstructive sleep apnea (adult) (pediatric): Secondary | ICD-10-CM | POA: Diagnosis present

## 2024-01-30 DIAGNOSIS — Z01810 Encounter for preprocedural cardiovascular examination: Secondary | ICD-10-CM | POA: Diagnosis not present

## 2024-01-30 DIAGNOSIS — I480 Paroxysmal atrial fibrillation: Secondary | ICD-10-CM | POA: Diagnosis not present

## 2024-01-30 DIAGNOSIS — I4819 Other persistent atrial fibrillation: Secondary | ICD-10-CM | POA: Diagnosis not present

## 2024-01-30 DIAGNOSIS — I493 Ventricular premature depolarization: Secondary | ICD-10-CM

## 2024-01-30 DIAGNOSIS — I471 Supraventricular tachycardia, unspecified: Secondary | ICD-10-CM

## 2024-01-30 NOTE — Patient Instructions (Signed)
Medication Instructions:   No changes *If you need a refill on your cardiac medications before your next appointment, please call your pharmacy*   Lab Work: Not needed .   Testing/Procedures:  Not needed  Follow-Up: At [facility], you and your health needs are our priority.  As part of our continuing mission to provide you with exceptional heart care, we have created designated Provider Care Teams.  These Care Teams include your primary Cardiologist (physician) and Advanced Practice Providers (APPs -  Physician Assistants and Nurse Practitioners) who all work together to provide you with the care you need, when you need it.     Your next appointment:   6 month(s)  The format for your next appointment:   In Person  Provider:   David Harding, MD   Other Instructions 

## 2024-01-30 NOTE — Progress Notes (Signed)
 Cardiology Office Note:  .   Date:  01/31/2024  ID:  Maria Meza, DOB 1955/12/27, MRN [id number] PCP: Maria Norleen PEDLAR, MD  [facility] Providers Cardiologist:  Maria Clay, MD Electrophysiologist:  Maria FORBES Furbish, MD    Atrial Fibrillation Clinic: [name], PA Chief Complaint  Patient presents with   Hospitalization Follow-up    Recent ER visit with A-fib requiring cardioversion   Follow-up    47-month follow-up, earlier than originally planned.    Patient Profile: .     Maria Meza is an obese 68 y.o. female with a PMH notable for OSA (on CPAP), SVT, TIA, hypothyroidism and relatively recently diagnosed PAF(persistent) who presents here for 8 month f/u at the request of Maria Golas, MD.  Cardiac History: History of SVT and PVCs-EP study years ago with no ablation A-fib diagnosed in September 2023 (lightheadedness dizziness palpitations with mild dyspnea) = Persistent Started on Eliquis  (CHA2DS2-VASc score 6: HTN, stroke (2), vascular history, age, gender) TEE DCCV (08/2022) 4 weeks after => back in A-fib-ER 10/30/2022 with redo DCCV. Initial plans for ablation in late 2023 canceled/delayed due to thyroid  issues. Symptomatic PVCs OSA on CPAP Obesity   Maria Meza was last seen in May 2024: She noted that ever since her thyroid  levels were stabilized the palpitations and A-fib/PAT burst settle down.  She was only noting occasional fluttering and twinges maybe once a day as opposed to numerous times a day.  She is staying adequately hydrated.  Noted exertional dyspnea going up stairs, but left knee pain was limiting exercise.  Was helped to work on weight loss.  Still using CPAP but not for naps. =>no changes made.  She was seen by Mr. Fenton, PA in the A-fib clinic on October 11, 2023: Continued on Lopressor  25 mg twice daily along with Eliquis  5 mg twice daily.  Did not plan for AAD.  EGD 11/01/2023  ER 01/17/2024 for A-fib = Maria Meza for Afib-  DCCV.  Subjective  Discussed the use of AI scribe software for clinical note transcription with the patient, who gave verbal consent to proceed.  History of Present Illness   Maria Meza is a 68 year old female with atrial fibrillation who presents for follow-up after recent cardioversion.  She experienced an episode of atrial fibrillation on January 16, 2024, requiring cardioversion in the emergency room. This was her first episode since November 24, 2022, when she spontaneously converted out of atrial fibrillation without the need for cardioversion. Prior to that, she was cardioverted on October 30, 2022, and had a transesophageal echocardiogram (TEE) -cardioversion in September 2023.  During the recent episode last month, her blood pressure increased to 177/100 mmHg & HR increased into the 170s.  She attempted to manage the symptoms with her usual medications (additional metoprolol , but they were ineffective. She was administered Cardizem  in the emergency room, which was unsuccessful, leading to the need for cardioversion. She is currently taking metoprolol  25 mg twice daily.  She uses a CPAP machine every night for sleep apnea and notes a significant difference when she does not use it. She experiences occasional dizziness, which she attributes to her medication, and shortness of breath when climbing stairs, which she associates with her weight. No chest pain, pressure, or tightness during daily activities, and no orthopnea or paroxysmal nocturnal dyspnea. She sleeps with the head of her bed elevated and uses one pillow.  She is also taking Eliquis  and is scheduled for a D&C  procedure due to a uterine polyp. She mentions a past episode of blood in her urine, which was investigated for kidney stones but was found to be related to the uterine polyp - may well need D&C for Endometriosis. No history of stroke symptoms, sudden onset weakness, or changes in vision.  Her husband also has  atrial fibrillation, and he is in the process of obtaining Eliquis  for him, which has been challenging due to insurance issues.        Objective    Studies Reviewed: SABRA   EKG Interpretation Date/Time:  Tuesday January 30 2024 10:26:17 EST Ventricular Rate:  53 PR Interval:  148 QRS Duration:  130 QT Interval:  452 QTC Calculation: 424 R Axis:   -12  Text Interpretation: Sinus bradycardia Right bundle branch block Inferior infarct , age undetermined When compared with ECG of 17-Jan-2024 03:28, Left anterior fasicular block NO LONGER PRESENT Confirmed by Anner Lenis ([id number]) on 01/30/2024 10:46:56 AM    No new Studies  No results found for: CHOL, HDL, LDLCALC, LDLDIRECT, TRIG, CHOLHDL Lab Results  Component Value Date   NA 141 01/17/2024   K 4.6 01/17/2024   CREATININE 0.79 01/17/2024   GFRNONAA >60 01/17/2024   GLUCOSE 116 (H) 01/17/2024   No results found for: HGBA1C    Risk Assessment/Calculations:    CHA2DS2-VASc Score = 6   This indicates a 9.7% annual risk of stroke. The patient's score is based upon: CHF History: 0 HTN History: 1 Diabetes History: 0 Stroke History: 2 Vascular Disease History: 1 Age Score: 1 Gender Score: 1   On DOAC          Physical Exam:   VS:  BP 120/70 (BP Location: Left Arm, Patient Position: Sitting)   Pulse (!) 53   Ht 5' 5 (1.651 m)   Wt 258 lb 9.6 oz (117.3 kg)   LMP 04/16/2016 (Exact Date)   SpO2 97%   BMI 43.03 kg/m    Wt Readings from Last 3 Encounters:  01/30/24 258 lb 9.6 oz (117.3 kg)  01/25/24 264 lb (119.7 kg)  12/05/23 264 lb 12.8 oz (120.1 kg)    GEN: Well nourished, well groomed; in no acute distress; obese NECK: No JVD; No carotid bruits CARDIAC: Normal S1, S2; RRR, no murmurs, rubs, gallops RESPIRATORY:  Clear to auscultation without rales, wheezing or rhonchi ; nonlabored, good air movement. ABDOMEN: Soft, non-tender, non-distended EXTREMITIES:  No edema; No deformity      ASSESSMENT  AND PLAN: .    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 She has done well on Eliquis  no bleeding issues.  Tolerating well.  Working with financial assistance.  Patient is pending scheduling for a D&C procedure due to a uterine polyp/endometriosis.  The procedure will need to be scheduled at least one month after the recent cardioversion on January 22nd.  -Discussed the need to hold Eliquis  2.5 days prior to the procedure. -Hold Eliquis  2.5 days prior to [facility] procedure. -Resume Eliquis  1 day post-procedure.      Persistent atrial fibrillation (HCC) - Primary (Chronic)   Persistent A-fib as it requires cardioversion.    Recent episode of AFib requiring cardioversion in the ER on January 16, 2024. This was the first  episode since October 30, 2022. Patient is currently on Metoprolol  25mg  twice daily. Discussed the possibility of increasing Metoprolol  dose during AFib episodes to potentially prevent the need for cardioversion. Also discussed the potential need for antiarrhythmic agents if frequency of AFib episodes increases. -Continue Metoprolol  25mg  twice daily. -Consider taking an additional 25mg  dose of Metoprolol  at the onset of AFib symptoms. -If frequency of AFib episodes increases, consider initiation of antiarrhythmic agents      Relevant Orders   EKG 12-Lead (Completed)   PSVT (paroxysmal supraventricular tachycardia) (HCC) (Chronic)   Well-controlled on beta-blocker.  It is possible that some of her fast runs could be PAT but she is not really having any pain besides this 1 recent A-fib episode. -Continue Lopressor  25 mg twice daily with occasional PRN dose breakthrough spells      Symptomatic PVCs (Chronic)   Well-controlled on  beta-blocker. -Continue metoprolol  25 mg twice daily. 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 She has had recurrent A-fib, but this is the first episode in the year plus.  At this point she is adequately rate controlled on beta-blocker (Lopressor  25 mg twice daily).  If she were to have A-fib during the procedure, would probably use amiodarone in an attempt to convert.  Although she has had A-fib, she has not had any heart failure or angina symptoms.  She is not had a stroke is not diabetic and therefore will be a low risk patient for low risk procedure.  No further cardiac evaluation would be necessary.  Perioperative Anticoagulation Patient is pending scheduling for a D&C procedure due to a uterine polyp/endometriosis.  The procedure will need to be scheduled at least one month after the recent cardioversion. Discussed the need to hold Eliquis  2.5 days prior to the procedure. -Hold Eliquis  2.5 days prior to [facility] procedure. -Resume Eliquis  1 day post-procedure.       Follow-up in 6 months or sooner if there is a breakthrough spell of AFib.   Return in about 6 months (around 07/29/2024) for Routine follow up with me.     Signed, Maria MICAEL Clay, MD, MS Maria Meza, M.D., M.S. Interventional Cardiologist  [facility]  Pager # [phone] Phone # [phone] [address] [place], [place] [address]

## 2024-01-31 ENCOUNTER — Encounter: Payer: Self-pay | Admitting: Cardiology

## 2024-01-31 DIAGNOSIS — Z01810 Encounter for preprocedural cardiovascular examination: Secondary | ICD-10-CM | POA: Insufficient documentation

## 2024-01-31 NOTE — Assessment & Plan Note (Addendum)
 Well-controlled on beta-blocker. -Continue metoprolol  25 mg twice daily. -Okay for all dose of metoprolol  for symptomatic palpitations/PVCs, A-fib or PAT

## 2024-01-31 NOTE — Assessment & Plan Note (Signed)
 Persistent A-fib as it requires cardioversion.    Recent episode of AFib requiring cardioversion in the ER on January 16, 2024. This was the first episode since October 30, 2022. Patient is currently on Metoprolol  25mg  twice daily. Discussed the possibility of increasing Metoprolol  dose during AFib episodes to potentially prevent the need for cardioversion. Also discussed the potential need for antiarrhythmic agents if frequency of AFib episodes increases. -Continue Metoprolol  25mg  twice daily. -Consider taking an additional 25mg  dose of Metoprolol  at the onset of AFib symptoms. -If frequency of AFib episodes increases, consider initiation of antiarrhythmic agents

## 2024-01-31 NOTE — Assessment & Plan Note (Addendum)
 She thinks that she probably will require D&C for endometriosis once she confirms with her gynecologist. She has had recurrent A-fib, but this is the first episode in the year plus.  At this point she is adequately rate controlled on beta-blocker (Lopressor  25 mg twice daily).  If she were to have A-fib during the procedure, would probably use amiodarone in an attempt to convert.  Although she has had A-fib, she has not had any heart failure or angina symptoms.  She is not had a stroke is not diabetic and therefore will be a low risk patient for low risk procedure.  No further cardiac evaluation would be necessary.  Perioperative Anticoagulation Patient is pending scheduling for a D&C procedure due to a uterine polyp/endometriosis.  The procedure will need to be scheduled at least one month after the recent cardioversion. Discussed the need to hold Eliquis  2.5 days prior to the procedure. -Hold Eliquis  2.5 days prior to [facility] procedure. -Resume Eliquis  1 day post-procedure.

## 2024-01-31 NOTE — Assessment & Plan Note (Addendum)
 CHA2DS2-VASc 4 is probably 22 with age sex hypertension and vascular disease I will give him 1 point as well as TIA giving 2 points. She has done well on Eliquis  no bleeding issues.  Tolerating well.  Working with financial assistance.  Patient is pending scheduling for a D&C procedure due to a uterine polyp/endometriosis.  The procedure will need to be scheduled at least one month after the recent cardioversion on January 22nd.  -Discussed the need to hold Eliquis  2.5 days prior to the procedure. -Hold Eliquis  2.5 days prior to [facility] procedure. -Resume Eliquis  1 day post-procedure.

## 2024-01-31 NOTE — Assessment & Plan Note (Addendum)
 Well-controlled on beta-blocker.  It is possible that some of her fast runs could be PAT but she is not really having any pain besides this 1 recent A-fib episode. -Continue Lopressor  25 mg twice daily with occasional PRN dose breakthrough spells

## 2024-02-13 ENCOUNTER — Other Ambulatory Visit: Payer: Self-pay

## 2024-02-13 ENCOUNTER — Encounter: Payer: Self-pay | Admitting: Cardiology

## 2024-02-13 MED ORDER — APIXABAN 5 MG PO TABS: 5.0000 mg | ORAL_TABLET | Freq: Two times a day (BID) | ORAL | 3 refills | Status: DC

## 2024-02-13 NOTE — Telephone Encounter (Signed)
 Prescription sent to pharmacy.

## 2024-02-14 ENCOUNTER — Ambulatory Visit: Payer: Medicare Other | Admitting: Obstetrics and Gynecology

## 2024-02-23 ENCOUNTER — Encounter: Payer: Self-pay | Admitting: Obstetrics and Gynecology

## 2024-02-23 ENCOUNTER — Ambulatory Visit: Payer: Medicare Other | Admitting: Obstetrics and Gynecology

## 2024-02-23 VITALS — BP 118/64 | HR 63 | Temp 97.4°F | Ht 65.0 in | Wt 262.0 lb

## 2024-02-23 DIAGNOSIS — N9489 Other specified conditions associated with female genital organs and menstrual cycle: Secondary | ICD-10-CM | POA: Diagnosis not present

## 2024-02-23 DIAGNOSIS — I4819 Other persistent atrial fibrillation: Secondary | ICD-10-CM

## 2024-02-23 DIAGNOSIS — R9389 Abnormal findings on diagnostic imaging of other specified body structures: Secondary | ICD-10-CM

## 2024-02-23 DIAGNOSIS — N950 Postmenopausal bleeding: Secondary | ICD-10-CM | POA: Diagnosis not present

## 2024-02-23 NOTE — Assessment & Plan Note (Addendum)
 Likely endometrial polyp, measuring 12mm. Plan for hysteroscopy, dilation and curettage, polypectomy.  Discussed outpatient procedure. Reviewed that  recovery is usually 1-2 days. Risks including infections, bleeding, and damage to surrounding organs reviewed. Recommend NPO prior to midnight and reviewed medication to take on day of surgery. Dicussed use of NSAIDS as needed for pain postoperatively.  Preop checklist: Antibiotics: none DVT ppx: SCDs Postop visit: 1 week Additional clearance: cardiac with med management

## 2024-02-23 NOTE — Progress Notes (Signed)
 68 y.o. G11P2002 female with Afib (metoprolol, eliquis, recent cardioversion 01/16/24), prior TIA, OSA, GERD, morbid obesity here for surgical consultation for postmenopausal bleeding and thickened endometrium. Patient of Dr. Edward Jolly. Married.  Patient's last menstrual period was 04/16/2016 (exact date).  Her work-up to date includes: 12/9/24L EMS 10 mm, bowel obscuring the uterine fundus, and nonvisualized ovaries.  12/04/23 EMB: benign  01/25/24 Pelvic US, SIS Uterus:  8.99 x 4.56 x 4.49 cm.  No myometrial masses. EMS:  6.55 mm.  Echogenic area noted.  Filling defect noted:  12 x 5 x 11 mm.  Left ovary:  1.86 x 1.26 x 1.04 cm.  Normal. Right ovary:  1.59 x 1.46 x 0.89 cm.   Normal. No adnexal massed No free fluid  +chronic palpations. No current bleeding. No CP, SOB. Had resent episode of atrial fibrillation and had cardioversion on January 16, 2024.  Last PAP:    Component Value Date/Time   DIAGPAP  10/16/2023 0837    - Negative for intraepithelial lesion or malignancy (NILM)   DIAGPAP  06/25/2021 1629    - Negative for intraepithelial lesion or malignancy (NILM)   DIAGPAP  06/04/2019 0000    NEGATIVE FOR INTRAEPITHELIAL LESIONS OR MALIGNANCY.   HPVHIGH Negative 10/16/2023 0837   ADEQPAP  10/16/2023 0837    Satisfactory for evaluation; transformation zone component PRESENT.   ADEQPAP  06/25/2021 1629    Satisfactory for evaluation; transformation zone component PRESENT.   ADEQPAP  06/04/2019 0000    Satisfactory for evaluation  endocervical/transformation zone component PRESENT.   GYN HISTORY: No significant history  OB History  Gravida Para Term Preterm AB Living  2 2 2   2   SAB IAB Ectopic Multiple Live Births          # Outcome Date GA Lbr Len/2nd Weight Sex Type Anes PTL Lv  2 Term           1 Term             Past Medical History:  Diagnosis Date   Absolute anemia    No longer on iron supplementation.   Adult celiac disease 2018   Anxiety    Arrhythmia  06/2016   Event monitor has shown PVCs, PSVT at 1 short burst of NSVT.;  Event monitor from March 2021: 6 short runs of wide-complex tachycardia and 25 runs of PSVT--intolerant of high-dose beta-blocker because of bradycardia.   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  (ANA positive, dsDNA negative)   Dysmenorrhea    Dysrhythmia    Elevated LFTs    Fatty liver    GERD (gastroesophageal reflux disease)    Glaucoma    History of COVID-19 02/19/2020   Hypothyroidism    IBS (irritable bowel syndrome)    Morbid obesity with BMI of 40.0-44.9, adult (HCC) 09/2020   BMI 43.07   MRSA infection 4-end-21   on abdomen   OSA on CPAP    Pulmonary hypertension (HCC)    PVC's (premature ventricular contractions)    Thyroid disease    hypothyroidism   TIA (transient ischemic attack)    2 per patient   Urinary incontinence    with sneezing, coughing    Past Surgical History:  Procedure Laterality Date  BIOPSY  11/01/2023   Procedure: BIOPSY;  Surgeon: Corbin Ade, MD;  Location: [facility];  Service: Endoscopy;;   CARDIAC EVENT MONITOR  06/2016   [facility]) Noted PVCs, PSVT and NSVT (1 episode of 20 beats). ->  PT evaluation suggested PVCs appear to have been completely interpolated.  Felt to be septal   CARDIAC MRI -ADENOSINE / STRESS  05/28/2020   [facility]): EF 69%. Normal LV size, thickness and function w/ NO RWMA  CO & CI 6.5 L/min & 3 L/min.      Normal RV size thickness and function.  No RV WMA or aneurysm.  No findings c/w  ARVC.  Both atria are mildly enlarged.  Trileaflet aortic valve with no evidence of stenosis.  Adenosine Stress Imaging: no  inducible myocardial ischemia OR prior infarction/scar or infiltrate. Normal AoV. Mid TR.   CARDIOVERSION  N/A 09/14/2022   Procedure: CARDIOVERSION;  Surgeon: Jake Bathe, MD;  Location: [facility] ORS;  Service: Cardiovascular;  Laterality: N/A;   CATARACT EXTRACTION Right 03/2018   CATARACT EXTRACTION Left 03/2018   CHOLECYSTECTOMY     ESOPHAGOGASTRODUODENOSCOPY (EGD) WITH PROPOFOL N/A 11/01/2023   Procedure: ESOPHAGOGASTRODUODENOSCOPY (EGD) WITH PROPOFOL;  Surgeon: Corbin Ade, MD;  Location: [facility];  Service: Endoscopy;  Laterality: N/A;  1245PM, ASA 3   LAPAROSCOPIC TUBAL LIGATION  1986   NUCLEAR STRESS TEST  05/2014   EF 55%.  Mixed anteroseptal defect consistent with breast attenuation artifact.   TEE WITHOUT CARDIOVERSION N/A 09/14/2022   Procedure: TRANSESOPHAGEAL ECHOCARDIOGRAM (TEE);  Surgeon: Jake Bathe, MD;  Location: [facility];  Service: Cardiovascular;  Laterality: N/A;   TRANSTHORACIC ECHOCARDIOGRAM  08/2016   [facility]- Normal LV size and function-EF~55%.  No RWMA..  Normal LA pressures.  Normal RV function.  Trivial AR, PR and TR.  No stenoses.    TRANSTHORACIC ECHOCARDIOGRAM  02/14/2022   Difficult images.  Normal EF 60 to 65%.  No RWMA.  Normal diastolic parameters.  Normal RV size and function.  Mild to moderate LA dilation.  Normal aortic and mitral valves.  Acing aorta measured roughly 38 mm.   ZIO PATCH CARDIAC EVENT MONITOR  02/2020   Predom Rhytym: SR - min 46 - max 113 bpm, avg 64 bpm. Rare isolated PVC & PACs. 6 short NSVT runs (8-11 beats, 134-245 bpm); 25 runs of  PAT/SVT - fastest 9 beats (200 bpm), longest 10.6 sec (130 bpm). Noted Sx w/ PAT & NSVT during daylight hours, and only once at midnight.   ZIO PATCH MONITOR  01/2022   Predominant SR: Rate range 46-99 bpm, avg 61 bpm.  Rare PACs & PVCs (& rare couplets).  No bigeminy/trigeminy.  (Sx noted w/ PACs and PVCs - & a few Atrial Runs).  35 Atrial Runs + 1 "V. tach "run of 4 Narrow Complex beats; fastest atrial run 5 beats @ 193 bpm & longest 22 beats (12.1 sec) @ avg 108 bpm.    Current Outpatient Medications on  File Prior to Visit  Medication Sig Dispense Refill   apixaban (ELIQUIS) 5 MG TABS tablet Take 1 tablet (5 mg total) by mouth 2 (two) times daily. 180 tablet 3   Ascorbic Acid (VITAMIN C) 1000 MG tablet Take 1,000 mg by mouth daily.     b complex vitamins tablet Take by mouth daily.     cholecalciferol (VITAMIN D3) 25 MCG (1000 UNIT) tablet Take 400 mcg by mouth daily.     folic acid (FOLVITE) 1 MG tablet  Take 2 mg by mouth daily. 2 tablets daily     latanoprost (XALATAN) 0.005 % ophthalmic solution Place 1 drop into both eyes daily.      levothyroxine (SYNTHROID) 137 MCG tablet Take 137 mcg by mouth daily.     magnesium oxide (MAG-OX) 400 MG tablet TAKE 1 TABLET BY MOUTH TWICE A DAY 180 tablet 3   metoprolol tartrate (LOPRESSOR) 25 MG tablet TAKE 1 TABLET BY MOUTH TWICE A DAY 180 tablet 3   mometasone (ELOCON) 0.1 % lotion Apply 1 application  topically.     pantoprazole (PROTONIX) 40 MG tablet Take 1 tablet (40 mg total) by mouth daily. 30 tablet 11   No current facility-administered medications on file prior to visit.    Allergies  Allergen Reactions   Amiodarone Other (See Comments)    "does not tolerate"--slows heart rate   Latex Other (See Comments)    blisters   Propafenone     Other Reaction(s): Other (See Comments)  Heart felt like it was going to stop; med did not work well.    Caused very low HR   Gluten Meal     Pt has celiac disease.    Hydrocodone Nausea And Vomiting   Propafenone Hcl     Caused very low HR   Verapamil     Fatigue, constipation      PE Today's Vitals   02/23/24 1015 02/23/24 1122  BP: 118/64   Pulse: (!) 48 63  Temp: (!) 97.4 F (36.3 C)   TempSrc: Oral   SpO2: 98%   Weight: 262 lb (118.8 kg)   Height: 5\' 5"  (1.651 m)    Body mass index is 43.6 kg/m.  Physical Exam Vitals reviewed.  Constitutional:      General: She is not in acute distress.    Appearance: Normal appearance.  HENT:     Head: Normocephalic and atraumatic.      Nose: Nose normal.  Eyes:     Extraocular Movements: Extraocular movements intact.     Conjunctiva/sclera: Conjunctivae normal.  Cardiovascular:     Rate and Rhythm: Regular rhythm. Bradycardia present.     Heart sounds: No murmur heard.    No friction rub. No gallop.  Pulmonary:     Effort: Pulmonary effort is normal. No respiratory distress.     Breath sounds: Normal breath sounds. No stridor. No wheezing, rhonchi or rales.  Abdominal:     General: There is no distension.     Palpations: Abdomen is soft.     Tenderness: There is no abdominal tenderness.  Musculoskeletal:        General: Normal range of motion.     Cervical back: Normal range of motion.  Neurological:     General: No focal deficit present.     Mental Status: She is alert.  Psychiatric:        Mood and Affect: Mood normal.        Behavior: Behavior normal.      Assessment and Plan:        Postmenopausal bleeding Assessment & Plan: Likely endometrial polyp, measuring 12mm. Plan for hysteroscopy, dilation and curettage, polypectomy.  Discussed outpatient procedure. Reviewed that  recovery is usually 1-2 days. Risks including infections, bleeding, and damage to surrounding organs reviewed. Recommend NPO prior to midnight and reviewed medication to take on day of surgery. Dicussed use of NSAIDS as needed for pain postoperatively.  Preop checklist: Antibiotics: none DVT ppx: SCDs Postop visit: 1 week Additional clearance:  cardiac with med management    Orders: -     Ambulatory Referral For Surgery Scheduling  Thickened endometrium -     Ambulatory Referral For Surgery Scheduling  Endometrial mass -     Ambulatory Referral For Surgery Scheduling  Persistent atrial fibrillation [facility])  Will need cardiac clearance for surgery, including recommendations for management of eliquis.   Rosalyn Gess, MD

## 2024-03-14 ENCOUNTER — Telehealth: Payer: Self-pay

## 2024-03-14 NOTE — Telephone Encounter (Signed)
   Pre-operative Risk Assessment    Patient Name: Maria Meza  DOB: 1956/01/08 MRN: [id number]   Date of last office visit: 01/30/24 with Dr. Herbie Baltimore Date of next office visit: None   Request for Surgical Clearance    Procedure:   Diagnostic hysteroscopy, dilation and curettage, polypectomy  Date of Surgery:  Clearance TBD                                 Surgeon:  Dr. Vertell Novak Group or Practice Name:  [facility] Phone number:  [phone] Fax number:  [phone]   Type of Clearance Requested:   - Medical  - Pharmacy:  Hold Apixaban (Eliquis) not indicated   Type of Anesthesia:  Local    Additional requests/questions:    Robley Fries   03/14/2024, 3:55 PM

## 2024-03-18 NOTE — Telephone Encounter (Signed)
   Primary Cardiologist: Bryan Lemma, MD  Chart reviewed as part of pre-operative protocol coverage. Given past medical history and time since last visit, based on ACC/AHA guidelines, Maria Meza would be at acceptable risk for the planned procedure without further cardiovascular testing.   Patient was advised that if she develops new symptoms prior to surgery to contact our office to arrange a follow-up appointment. She verbalized understanding.  Per office protocol, patient can hold Eliquis for 2 days prior to procedure.    I will route this recommendation to the requesting party via Epic fax function and remove from pre-op pool.  Please call with questions.  Levi Aland, NP-C  03/18/2024, 9:53 AM [address] Office [phone] Fax [phone]

## 2024-03-18 NOTE — Telephone Encounter (Signed)
 Patient with diagnosis of afib on Eliquis for anticoagulation.    Procedure: Diagnostic hysteroscopy, dilation and curettage, polypectomy  Date of procedure: TBD   CHA2DS2-VASc Score = 6   This indicates a 9.7% annual risk of stroke. The patient's score is based upon: CHF History: 0 HTN History: 1 Diabetes History: 0 Stroke History: 2 Vascular Disease History: 1 Age Score: 1 Gender Score: 1      CrCl 88 ml/min Platelet count 358  Per office protocol, patient can hold Eliquis for 2 days prior to procedure.    **This guidance is not considered finalized until pre-operative APP has relayed final recommendations.**

## 2024-04-01 ENCOUNTER — Other Ambulatory Visit: Payer: Self-pay | Admitting: *Deleted

## 2024-04-01 MED ORDER — APIXABAN 5 MG PO TABS: 5.0000 mg | ORAL_TABLET | Freq: Two times a day (BID) | ORAL | Status: AC

## 2024-04-02 ENCOUNTER — Other Ambulatory Visit: Payer: Self-pay

## 2024-04-02 DIAGNOSIS — R7989 Other specified abnormal findings of blood chemistry: Secondary | ICD-10-CM

## 2024-04-04 ENCOUNTER — Encounter: Payer: Self-pay | Admitting: Obstetrics and Gynecology

## 2024-04-04 ENCOUNTER — Ambulatory Visit: Admitting: Obstetrics and Gynecology

## 2024-04-04 VITALS — BP 126/64 | HR 60 | Temp 97.9°F | Ht 65.0 in | Wt 260.0 lb

## 2024-04-04 DIAGNOSIS — N9489 Other specified conditions associated with female genital organs and menstrual cycle: Secondary | ICD-10-CM

## 2024-04-04 DIAGNOSIS — I4819 Other persistent atrial fibrillation: Secondary | ICD-10-CM

## 2024-04-04 DIAGNOSIS — N950 Postmenopausal bleeding: Secondary | ICD-10-CM

## 2024-04-04 NOTE — Progress Notes (Signed)
 68 y.o. [id number] female with Afib (metoprolol, eliquis, recent cardioversion 01/16/24), prior TIA, OSA, GERD, morbid obesity, postmenopausal bleeding and thickened endometrium/mass here for preop exam for diagnostic hysteroscopy, dilation and curettage, polypectomy-scheduled 04/29/2024. Patient of Dr. Edward Jolly. Married.  Patient's last menstrual period was 04/16/2016 (exact date).  Her work-up to date includes: 12/9/24L EMS 10 mm, bowel obscuring the uterine fundus, and nonvisualized ovaries.  12/04/23 EMB: benign  01/25/24 Pelvic US, SIS Uterus:  8.99 x 4.56 x 4.49 cm.  No myometrial masses. EMS:  6.55 mm.  Echogenic area noted.  Filling defect noted:  12 x 5 x 11 mm.  Left ovary:  1.86 x 1.26 x 1.04 cm.  Normal. Right ovary:  1.59 x 1.46 x 0.89 cm.   Normal. No adnexal massed No free fluid  No current bleeding, none since last appt. No CP, SOB. +chronic palpations.  Had resent episode of atrial fibrillation and had cardioversion on January 16, 2024. Has done well since. Planning for root canal next week.  Last PAP:    Component Value Date/Time   DIAGPAP  10/16/2023 0837    - Negative for intraepithelial lesion or malignancy (NILM)   DIAGPAP  06/25/2021 1629    - Negative for intraepithelial lesion or malignancy (NILM)   DIAGPAP  06/04/2019 0000    NEGATIVE FOR INTRAEPITHELIAL LESIONS OR MALIGNANCY.   HPVHIGH Negative 10/16/2023 0837   ADEQPAP  10/16/2023 0837    Satisfactory for evaluation; transformation zone component PRESENT.   ADEQPAP  06/25/2021 1629    Satisfactory for evaluation; transformation zone component PRESENT.   ADEQPAP  06/04/2019 0000    Satisfactory for evaluation  endocervical/transformation zone component PRESENT.   GYN HISTORY: No significant history  OB History  Gravida Para Term Preterm AB Living  2 2 2   2   SAB IAB Ectopic Multiple Live Births          # Outcome Date GA Lbr Len/2nd Weight Sex Type Anes PTL Lv  2 Term           1 Term              Past Medical History:  Diagnosis Date   Absolute anemia    No longer on iron supplementation.   Adult celiac disease 2018   Anxiety    Arrhythmia 06/2016   Event monitor has shown PVCs, PSVT at 1 short burst of NSVT.;  Event monitor from March 2021: 6 short runs of wide-complex tachycardia and 25 runs of PSVT--intolerant of high-dose beta-blocker because of bradycardia.   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  (ANA positive, dsDNA negative)   Dysmenorrhea    Dysrhythmia    Elevated LFTs    Fatty liver    GERD (gastroesophageal reflux disease)    Glaucoma    History of COVID-19 02/19/2020   Hypothyroidism    IBS (irritable bowel syndrome)    Morbid obesity with BMI of 40.0-44.9, adult (HCC) 09/2020   BMI 43.07   MRSA infection 4-end-21   on abdomen   OSA on CPAP    Pulmonary hypertension (HCC)    PVC's (premature ventricular contractions)    Thyroid disease    hypothyroidism   TIA (transient ischemic attack)    2 per  patient   Urinary incontinence    with sneezing, coughing    Past Surgical History:  Procedure Laterality Date   BIOPSY  11/01/2023   Procedure: BIOPSY;  Surgeon: Corbin Ade, MD;  [facility];  Service: Endoscopy;;   CARDIAC EVENT MONITOR  06/2016   [facility]) Noted PVCs, PSVT and NSVT (1 episode of 20 beats). ->  PT evaluation suggested PVCs appear to have been completely interpolated.  Felt to be septal   CARDIAC MRI -ADENOSINE / STRESS  05/28/2020   [facility]): EF 69%. Normal LV size, thickness and function w/ NO RWMA  CO & CI 6.5 L/min & 3 L/min.      Normal RV size thickness and function.  No RV WMA or aneurysm.  No findings c/w  ARVC.  Both atria are mildly enlarged.  Trileaflet aortic valve with no  evidence of stenosis.  Adenosine Stress Imaging: no  inducible myocardial ischemia OR prior infarction/scar or infiltrate. Normal AoV. Mid TR.   CARDIOVERSION N/A 09/14/2022   Procedure: CARDIOVERSION;  Surgeon: Jake Bathe, MD;  Location: [facility];  Service: Cardiovascular;  Laterality: N/A;   CATARACT EXTRACTION Right 03/2018   CATARACT EXTRACTION Left 03/2018   CHOLECYSTECTOMY     ESOPHAGOGASTRODUODENOSCOPY (EGD) WITH PROPOFOL N/A 11/01/2023   Procedure: ESOPHAGOGASTRODUODENOSCOPY (EGD) WITH PROPOFOL;  Surgeon: Corbin Ade, MD;  [facility];  Service: Endoscopy;  Laterality: N/A;  1245PM, ASA 3   LAPAROSCOPIC TUBAL LIGATION  1986   NUCLEAR STRESS TEST  05/2014   EF 55%.  Mixed anteroseptal defect consistent with breast attenuation artifact.   TEE WITHOUT CARDIOVERSION N/A 09/14/2022   Procedure: TRANSESOPHAGEAL ECHOCARDIOGRAM (TEE);  Surgeon: Jake Bathe, MD;  Location: [facility];  Service: Cardiovascular;  Laterality: N/A;   TRANSTHORACIC ECHOCARDIOGRAM  08/2016   [facility]- Normal LV size and function-EF~55%.  No RWMA..  Normal LA pressures.  Normal RV function.  Trivial AR, PR and TR.  No stenoses.    TRANSTHORACIC ECHOCARDIOGRAM  02/14/2022   Difficult images.  Normal EF 60 to 65%.  No RWMA.  Normal diastolic parameters.  Normal RV size and function.  Mild to moderate LA dilation.  Normal aortic and mitral valves.  Acing aorta measured roughly 38 mm.   ZIO PATCH CARDIAC EVENT MONITOR  02/2020   Predom Rhytym: SR - min 46 - max 113 bpm, avg 64 bpm. Rare isolated PVC & PACs. 6 short NSVT runs (8-11 beats, 134-245 bpm); 25 runs of  PAT/SVT - fastest 9 beats (200 bpm), longest 10.6 sec (130 bpm). Noted Sx w/ PAT & NSVT during daylight hours, and only once at midnight.   ZIO PATCH MONITOR  01/2022   Predominant SR: Rate range 46-99 bpm, avg 61 bpm.  Rare PACs & PVCs (& rare couplets).  No bigeminy/trigeminy.  (Sx noted w/ PACs and PVCs - & a few Atrial Runs).  35 Atrial Runs + 1 "V.  tach "run of 4 Narrow Complex beats; fastest atrial run 5 beats @ 193 bpm & longest 22 beats (12.1 sec) @ avg 108 bpm.    Current Outpatient Medications on File Prior to Visit  Medication Sig Dispense Refill   apixaban (ELIQUIS) 5 MG TABS tablet Take 1 tablet (5 mg total) by mouth 2 (two) times daily.     Ascorbic Acid (VITAMIN C) 1000 MG tablet Take 1,000 mg by mouth daily.     b complex vitamins tablet Take by mouth daily.     cholecalciferol (VITAMIN  D3) 25 MCG (1000 UNIT) tablet Take 400 mcg by mouth daily.     folic acid (FOLVITE) 1 MG tablet Take 2 mg by mouth daily. 2 tablets daily     latanoprost (XALATAN) 0.005 % ophthalmic solution Place 1 drop into both eyes daily.      levothyroxine (SYNTHROID) 137 MCG tablet Take 137 mcg by mouth daily.     magnesium oxide (MAG-OX) 400 MG tablet TAKE 1 TABLET BY MOUTH TWICE A DAY 180 tablet 3   metoprolol tartrate (LOPRESSOR) 25 MG tablet TAKE 1 TABLET BY MOUTH TWICE A DAY 180 tablet 3   mometasone (ELOCON) 0.1 % lotion Apply 1 application  topically.     pantoprazole (PROTONIX) 40 MG tablet Take 1 tablet (40 mg total) by mouth daily. 30 tablet 11   No current facility-administered medications on file prior to visit.    Allergies  Allergen Reactions   Amiodarone Other (See Comments)    "does not tolerate"--slows heart rate   Latex Other (See Comments)    blisters   Propafenone     Other Reaction(s): Other (See Comments)  Heart felt like it was going to stop; med did not work well.    Caused very low HR   Gluten Meal     Pt has celiac disease.    Hydrocodone Nausea And Vomiting   Propafenone Hcl     Caused very low HR   Verapamil     Fatigue, constipation      PE Today's Vitals   04/04/24 1607  BP: 126/64  Pulse: 60  Temp: 97.9 F (36.6 C)  TempSrc: Oral  SpO2: 97%  Weight: 260 lb (117.9 kg)  Height: 5\' 5"  (1.651 m)   Body mass index is 43.27 kg/m.  Physical Exam Vitals reviewed.  Constitutional:      General:  She is not in acute distress.    Appearance: Normal appearance.  HENT:     Head: Normocephalic and atraumatic.     Nose: Nose normal.  Eyes:     Extraocular Movements: Extraocular movements intact.     Conjunctiva/sclera: Conjunctivae normal.  Cardiovascular:     Rate and Rhythm: Regular rhythm. Bradycardia present.     Heart sounds: No murmur heard.    No friction rub. No gallop.  Pulmonary:     Effort: Pulmonary effort is normal. No respiratory distress.     Breath sounds: Normal breath sounds. No stridor. No wheezing, rhonchi or rales.  Abdominal:     General: There is no distension.     Palpations: Abdomen is soft.     Tenderness: There is no abdominal tenderness.  Musculoskeletal:        General: Normal range of motion.     Cervical back: Normal range of motion.  Neurological:     General: No focal deficit present.     Mental Status: She is alert.  Psychiatric:        Mood and Affect: Mood normal.        Behavior: Behavior normal.      Assessment and Plan:        Postmenopausal bleeding Assessment & Plan: Likely endometrial polyp, measuring 12mm. Plan for diagnostic hysteroscopy, dilation and curettage, polypectomy.  Discussed outpatient procedure. Reviewed that  recovery is usually 1-2 days. Risks including infections, bleeding, and damage to surrounding organs reviewed. Recommend NPO prior to midnight and reviewed medication to take on day of surgery. Dicussed use of NSAIDS as needed for pain postoperatively.  Preop  checklist: Antibiotics: none DVT ppx: SCDs Postop visit: 1 week Additional clearance: Cleared by cardiology 03/18/2024, recommend holding Eliquis 2 days prior to procedure.   LATEX allergy    Endometrial mass Assessment & Plan: As noted above   Persistent atrial fibrillation (HCC)  Cleared by cardiology  Rosalyn Gess, MD

## 2024-04-04 NOTE — Patient Instructions (Addendum)
 Preoperative instructions: Nothing to eat or drink after midnight, unless instructed differently regarding clear liquids by the anesthesia team at [facility] health. Do not take any medications on the day of surgery, except those listed below: NONE, anesthesia will let you know about use of metoprolol  Please follow all other instructions as provided by our surgical scheduler at [facility] and the anesthesia team at [facility].  Postoperative instructions: Clean your incision daily with mild soapy water.  Sitz bath are helpful for wound healing.   Ensure that you thoroughly dry your wound following cleaning and keep dry throughout the day.  You can apply a thin layer of Aquaphor or Vaseline over your incision. Ice packs can be applied to your incision for up to 20 minutes at a time to help with pain and swelling. Take ibuprofen as prescribed and over-the-counter Tylenol as needed.

## 2024-04-04 NOTE — H&P (View-Only) (Signed)
 68 y.o. [id number] female with Afib (metoprolol, eliquis, recent cardioversion 01/16/24), prior TIA, OSA, GERD, morbid obesity, postmenopausal bleeding and thickened endometrium/mass here for preop exam for diagnostic hysteroscopy, dilation and curettage, polypectomy-scheduled 04/29/2024. Patient of Dr. Edward Jolly. Married.  Patient's last menstrual period was 04/16/2016 (exact date).  Her work-up to date includes: 12/9/24L EMS 10 mm, bowel obscuring the uterine fundus, and nonvisualized ovaries.  12/04/23 EMB: benign  01/25/24 Pelvic US, SIS Uterus:  8.99 x 4.56 x 4.49 cm.  No myometrial masses. EMS:  6.55 mm.  Echogenic area noted.  Filling defect noted:  12 x 5 x 11 mm.  Left ovary:  1.86 x 1.26 x 1.04 cm.  Normal. Right ovary:  1.59 x 1.46 x 0.89 cm.   Normal. No adnexal massed No free fluid  No current bleeding, none since last appt. No CP, SOB. +chronic palpations.  Had resent episode of atrial fibrillation and had cardioversion on January 16, 2024. Has done well since. Planning for root canal next week.  Last PAP:    Component Value Date/Time   DIAGPAP  10/16/2023 0837    - Negative for intraepithelial lesion or malignancy (NILM)   DIAGPAP  06/25/2021 1629    - Negative for intraepithelial lesion or malignancy (NILM)   DIAGPAP  06/04/2019 0000    NEGATIVE FOR INTRAEPITHELIAL LESIONS OR MALIGNANCY.   HPVHIGH Negative 10/16/2023 0837   ADEQPAP  10/16/2023 0837    Satisfactory for evaluation; transformation zone component PRESENT.   ADEQPAP  06/25/2021 1629    Satisfactory for evaluation; transformation zone component PRESENT.   ADEQPAP  06/04/2019 0000    Satisfactory for evaluation  endocervical/transformation zone component PRESENT.   GYN HISTORY: No significant history  OB History  Gravida Para Term Preterm AB Living  2 2 2   2   SAB IAB Ectopic Multiple Live Births          # Outcome Date GA Lbr Len/2nd Weight Sex Type Anes PTL Lv  2 Term           1 Term              Past Medical History:  Diagnosis Date   Absolute anemia    No longer on iron supplementation.   Adult celiac disease 2018   Anxiety    Arrhythmia 06/2016   Event monitor has shown PVCs, PSVT at 1 short burst of NSVT.;  Event monitor from March 2021: 6 short runs of wide-complex tachycardia and 25 runs of PSVT--intolerant of high-dose beta-blocker because of bradycardia.   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  (ANA positive, dsDNA negative)   Dysmenorrhea    Dysrhythmia    Elevated LFTs    Fatty liver    GERD (gastroesophageal reflux disease)    Glaucoma    History of COVID-19 02/19/2020   Hypothyroidism    IBS (irritable bowel syndrome)    Morbid obesity with BMI of 40.0-44.9, adult (HCC) 09/2020   BMI 43.07   MRSA infection 4-end-21   on abdomen   OSA on CPAP    Pulmonary hypertension (HCC)    PVC's (premature ventricular contractions)    Thyroid disease    hypothyroidism   TIA (transient ischemic attack)    2 per  patient   Urinary incontinence    with sneezing, coughing    Past Surgical History:  Procedure Laterality Date   BIOPSY  11/01/2023   Procedure: BIOPSY;  Surgeon: Corbin Ade, MD;  [facility];  Service: Endoscopy;;   CARDIAC EVENT MONITOR  06/2016   [facility]) Noted PVCs, PSVT and NSVT (1 episode of 20 beats). ->  PT evaluation suggested PVCs appear to have been completely interpolated.  Felt to be septal   CARDIAC MRI -ADENOSINE / STRESS  05/28/2020   [facility]): EF 69%. Normal LV size, thickness and function w/ NO RWMA  CO & CI 6.5 L/min & 3 L/min.      Normal RV size thickness and function.  No RV WMA or aneurysm.  No findings c/w  ARVC.  Both atria are mildly enlarged.  Trileaflet aortic valve with no  evidence of stenosis.  Adenosine Stress Imaging: no  inducible myocardial ischemia OR prior infarction/scar or infiltrate. Normal AoV. Mid TR.   CARDIOVERSION N/A 09/14/2022   Procedure: CARDIOVERSION;  Surgeon: Jake Bathe, MD;  Location: [facility];  Service: Cardiovascular;  Laterality: N/A;   CATARACT EXTRACTION Right 03/2018   CATARACT EXTRACTION Left 03/2018   CHOLECYSTECTOMY     ESOPHAGOGASTRODUODENOSCOPY (EGD) WITH PROPOFOL N/A 11/01/2023   Procedure: ESOPHAGOGASTRODUODENOSCOPY (EGD) WITH PROPOFOL;  Surgeon: Corbin Ade, MD;  [facility];  Service: Endoscopy;  Laterality: N/A;  1245PM, ASA 3   LAPAROSCOPIC TUBAL LIGATION  1986   NUCLEAR STRESS TEST  05/2014   EF 55%.  Mixed anteroseptal defect consistent with breast attenuation artifact.   TEE WITHOUT CARDIOVERSION N/A 09/14/2022   Procedure: TRANSESOPHAGEAL ECHOCARDIOGRAM (TEE);  Surgeon: Jake Bathe, MD;  Location: [facility];  Service: Cardiovascular;  Laterality: N/A;   TRANSTHORACIC ECHOCARDIOGRAM  08/2016   [facility]- Normal LV size and function-EF~55%.  No RWMA..  Normal LA pressures.  Normal RV function.  Trivial AR, PR and TR.  No stenoses.    TRANSTHORACIC ECHOCARDIOGRAM  02/14/2022   Difficult images.  Normal EF 60 to 65%.  No RWMA.  Normal diastolic parameters.  Normal RV size and function.  Mild to moderate LA dilation.  Normal aortic and mitral valves.  Acing aorta measured roughly 38 mm.   ZIO PATCH CARDIAC EVENT MONITOR  02/2020   Predom Rhytym: SR - min 46 - max 113 bpm, avg 64 bpm. Rare isolated PVC & PACs. 6 short NSVT runs (8-11 beats, 134-245 bpm); 25 runs of  PAT/SVT - fastest 9 beats (200 bpm), longest 10.6 sec (130 bpm). Noted Sx w/ PAT & NSVT during daylight hours, and only once at midnight.   ZIO PATCH MONITOR  01/2022   Predominant SR: Rate range 46-99 bpm, avg 61 bpm.  Rare PACs & PVCs (& rare couplets).  No bigeminy/trigeminy.  (Sx noted w/ PACs and PVCs - & a few Atrial Runs).  35 Atrial Runs + 1 "V.  tach "run of 4 Narrow Complex beats; fastest atrial run 5 beats @ 193 bpm & longest 22 beats (12.1 sec) @ avg 108 bpm.    Current Outpatient Medications on File Prior to Visit  Medication Sig Dispense Refill   apixaban (ELIQUIS) 5 MG TABS tablet Take 1 tablet (5 mg total) by mouth 2 (two) times daily.     Ascorbic Acid (VITAMIN C) 1000 MG tablet Take 1,000 mg by mouth daily.     b complex vitamins tablet Take by mouth daily.     cholecalciferol (VITAMIN  D3) 25 MCG (1000 UNIT) tablet Take 400 mcg by mouth daily.     folic acid (FOLVITE) 1 MG tablet Take 2 mg by mouth daily. 2 tablets daily     latanoprost (XALATAN) 0.005 % ophthalmic solution Place 1 drop into both eyes daily.      levothyroxine (SYNTHROID) 137 MCG tablet Take 137 mcg by mouth daily.     magnesium oxide (MAG-OX) 400 MG tablet TAKE 1 TABLET BY MOUTH TWICE A DAY 180 tablet 3   metoprolol tartrate (LOPRESSOR) 25 MG tablet TAKE 1 TABLET BY MOUTH TWICE A DAY 180 tablet 3   mometasone (ELOCON) 0.1 % lotion Apply 1 application  topically.     pantoprazole (PROTONIX) 40 MG tablet Take 1 tablet (40 mg total) by mouth daily. 30 tablet 11   No current facility-administered medications on file prior to visit.    Allergies  Allergen Reactions   Amiodarone Other (See Comments)    "does not tolerate"--slows heart rate   Latex Other (See Comments)    blisters   Propafenone     Other Reaction(s): Other (See Comments)  Heart felt like it was going to stop; med did not work well.    Caused very low HR   Gluten Meal     Pt has celiac disease.    Hydrocodone Nausea And Vomiting   Propafenone Hcl     Caused very low HR   Verapamil     Fatigue, constipation      PE Today's Vitals   04/04/24 1607  BP: 126/64  Pulse: 60  Temp: 97.9 F (36.6 C)  TempSrc: Oral  SpO2: 97%  Weight: 260 lb (117.9 kg)  Height: 5\' 5"  (1.651 m)   Body mass index is 43.27 kg/m.  Physical Exam Vitals reviewed.  Constitutional:      General:  She is not in acute distress.    Appearance: Normal appearance.  HENT:     Head: Normocephalic and atraumatic.     Nose: Nose normal.  Eyes:     Extraocular Movements: Extraocular movements intact.     Conjunctiva/sclera: Conjunctivae normal.  Cardiovascular:     Rate and Rhythm: Regular rhythm. Bradycardia present.     Heart sounds: No murmur heard.    No friction rub. No gallop.  Pulmonary:     Effort: Pulmonary effort is normal. No respiratory distress.     Breath sounds: Normal breath sounds. No stridor. No wheezing, rhonchi or rales.  Abdominal:     General: There is no distension.     Palpations: Abdomen is soft.     Tenderness: There is no abdominal tenderness.  Musculoskeletal:        General: Normal range of motion.     Cervical back: Normal range of motion.  Neurological:     General: No focal deficit present.     Mental Status: She is alert.  Psychiatric:        Mood and Affect: Mood normal.        Behavior: Behavior normal.      Assessment and Plan:        Postmenopausal bleeding Assessment & Plan: Likely endometrial polyp, measuring 12mm. Plan for diagnostic hysteroscopy, dilation and curettage, polypectomy.  Discussed outpatient procedure. Reviewed that  recovery is usually 1-2 days. Risks including infections, bleeding, and damage to surrounding organs reviewed. Recommend NPO prior to midnight and reviewed medication to take on day of surgery. Dicussed use of NSAIDS as needed for pain postoperatively.  Preop  checklist: Antibiotics: none DVT ppx: SCDs Postop visit: 1 week Additional clearance: Cleared by cardiology 03/18/2024, recommend holding Eliquis 2 days prior to procedure.   LATEX allergy    Endometrial mass Assessment & Plan: As noted above   Persistent atrial fibrillation (HCC)  Cleared by cardiology  Rosalyn Gess, MD

## 2024-04-04 NOTE — Assessment & Plan Note (Addendum)
 Likely endometrial polyp, measuring 12mm. Plan for diagnostic hysteroscopy, dilation and curettage, polypectomy.  Discussed outpatient procedure. Reviewed that  recovery is usually 1-2 days. Risks including infections, bleeding, and damage to surrounding organs reviewed. Recommend NPO prior to midnight and reviewed medication to take on day of surgery. Dicussed use of NSAIDS as needed for pain postoperatively.  Preop checklist: Antibiotics: none DVT ppx: SCDs Postop visit: 1 week Additional clearance: Cleared by cardiology 03/18/2024, recommend holding Eliquis 2 days prior to procedure.   LATEX allergy

## 2024-04-04 NOTE — Assessment & Plan Note (Signed)
 As noted above

## 2024-04-08 ENCOUNTER — Encounter: Payer: Self-pay | Admitting: *Deleted

## 2024-04-09 ENCOUNTER — Encounter: Payer: Self-pay | Admitting: Obstetrics and Gynecology

## 2024-04-12 ENCOUNTER — Ambulatory Visit: Payer: Medicare Other | Admitting: Cardiology

## 2024-04-15 ENCOUNTER — Telehealth: Payer: Self-pay | Admitting: Cardiology

## 2024-04-15 NOTE — Telephone Encounter (Signed)
 Pt c/o medication issue:  1. Name of Medication:   apixaban  (ELIQUIS ) 5 MG TABS tablet    2. How are you currently taking this medication (dosage and times per day)? Take 1 tablet (5 mg total) by mouth 2 (two) times daily.   3. Are you having a reaction (difficulty breathing--STAT)? No  4. What is your medication issue? Patient is requesting to speak with RN Genevia Kern in regard to submitting an application to [employer]. Please advise.

## 2024-04-15 NOTE — Telephone Encounter (Signed)
 Called patient and patient requesting to speak with Genevia Kern, RN. Made patient aware that provider and Genevia Kern, RN are out of office today. Made patient aware that this will be forward for information regarding  [employer] application status. Patient verbalized an understanding.

## 2024-04-16 NOTE — Telephone Encounter (Signed)
 RN spoke to patient. She states she has the out of pocket expense  ( over 3%)  to send to [employer].   RN informed patient to call company to ask if the only information is the out of pocket expense. When she talk to representative. Have their patient ID # . It will aid the representative to find their individual application  She verbalized understanding.

## 2024-04-17 ENCOUNTER — Ambulatory Visit: Payer: Medicare Other | Admitting: Gastroenterology

## 2024-04-18 ENCOUNTER — Encounter: Payer: Self-pay | Admitting: Pharmacy Technician

## 2024-04-18 LAB — HEPATIC FUNCTION PANEL
ALT: 34 IU/L — ABNORMAL HIGH (ref 0–32)
AST: 24 IU/L (ref 0–40)
Albumin: 4.3 g/dL (ref 3.9–4.9)
Alkaline Phosphatase: 95 IU/L (ref 44–121)
Bilirubin Total: 0.4 mg/dL (ref 0.0–1.2)
Bilirubin, Direct: 0.13 mg/dL (ref 0.00–0.40)
Total Protein: 6.6 g/dL (ref 6.0–8.5)

## 2024-04-18 NOTE — Telephone Encounter (Signed)
   I sent the patient a mychart message about the denial

## 2024-04-19 ENCOUNTER — Encounter: Payer: Self-pay | Admitting: Gastroenterology

## 2024-04-19 ENCOUNTER — Encounter: Payer: Self-pay | Admitting: *Deleted

## 2024-04-19 ENCOUNTER — Ambulatory Visit (INDEPENDENT_AMBULATORY_CARE_PROVIDER_SITE_OTHER): Admitting: Gastroenterology

## 2024-04-19 VITALS — BP 120/70 | HR 61 | Temp 98.2°F | Ht 65.0 in | Wt 262.4 lb

## 2024-04-19 DIAGNOSIS — K900 Celiac disease: Secondary | ICD-10-CM

## 2024-04-19 DIAGNOSIS — R7401 Elevation of levels of liver transaminase levels: Secondary | ICD-10-CM | POA: Diagnosis not present

## 2024-04-19 DIAGNOSIS — K2100 Gastro-esophageal reflux disease with esophagitis, without bleeding: Secondary | ICD-10-CM | POA: Diagnosis not present

## 2024-04-19 DIAGNOSIS — K760 Fatty (change of) liver, not elsewhere classified: Secondary | ICD-10-CM

## 2024-04-19 DIAGNOSIS — K219 Gastro-esophageal reflux disease without esophagitis: Secondary | ICD-10-CM

## 2024-04-19 DIAGNOSIS — R7989 Other specified abnormal findings of blood chemistry: Secondary | ICD-10-CM

## 2024-04-19 NOTE — Patient Instructions (Signed)
 Continue pantoprazole  before breakfast.  Continue to strive for weight loss, low fat/low carb diet. If you decide you would like a referral to [facility] just let me know!  Return to the office in six months for follow up.

## 2024-04-19 NOTE — Progress Notes (Signed)
 GI Office Note    Referring Provider: Omie Bickers, MD Primary Care Physician:  Omie Bickers, MD  Primary Gastroenterologist: Rheba Cedar, MD   Chief Complaint   Chief Complaint  Patient presents with   Follow-up    History of Present Illness   Maria Meza is a 68 y.o. female presenting today for follow-up.  She was last seen December 2024.  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    Patient completed LFTs 2 days ago in preparation for today's visit.  Her ALT has come down from 41 to 34 (0-32 normal).  In July 2024, negative hepatitis B and C markers.  Her TTG IgA was elevated at 81 indicating gluten exposure.  Iron normal, iron sats 30%, ferritin 164, folate greater than 20, CRP and sed rate normal.  She does have a history of hepatic steatosis on CT with IV contrast in November 2018.  Today: Feels well.  Abdominal pain previously resolved with PPI.  No heartburn.  Bowel movements are regular.  No melena or rectal bleeding.  No dysphagia.  No vomiting.  Trying to be more active but has had issues with A-fib even with minimal activity.  Wants to lose weight.  Maintains gluten-free diet.   EGD in November 2024: -Mild erosive reflux esophagitis.  Noncritical Schatzki ring not manipulated -Small hiatal hernia.  Abnormal gastric mucosa of unclear significance status post biopsy, reactive gastropathy with mild chronic gastritis.  H. pylori negative. - Prominent ampulla.   -Subtle scalloping of duodenal fold tips.  Biopsy with reactive duodenal mucosa.  Negative for intraepithelial lymphocytosis. -Epigastric/right upper quadrant pain may be due to reflux component, resolved on PPI  Medications   Current Outpatient Medications  Medication Sig Dispense Refill   apixaban  (ELIQUIS ) 5 MG TABS tablet Take 1 tablet (5 mg total) by mouth 2 (two) times daily.     Ascorbic Acid  (VITAMIN C ) 1000 MG tablet Take 1,000  mg by mouth daily.     b complex vitamins tablet Take by mouth daily.     cholecalciferol (VITAMIN D3) 25 MCG (1000 UNIT) tablet Take 400 mcg by mouth daily.     folic acid  (FOLVITE ) 1 MG tablet Take 2 mg by mouth daily. 2 tablets daily     latanoprost  (XALATAN ) 0.005 % ophthalmic solution Place 1 drop into both eyes daily.      levothyroxine  (SYNTHROID ) 137 MCG tablet Take 137 mcg by mouth daily.     magnesium  oxide (MAG-OX) 400 MG tablet TAKE 1 TABLET BY MOUTH TWICE A DAY 180 tablet 3   metoprolol  tartrate (LOPRESSOR ) 25 MG tablet TAKE 1 TABLET BY MOUTH TWICE A DAY 180 tablet 3   mometasone (ELOCON) 0.1 % lotion Apply 1 application  topically.     pantoprazole  (PROTONIX ) 40 MG tablet Take 1 tablet (40 mg total) by mouth daily. 30 tablet 11   No current facility-administered medications for this visit.    Allergies   Allergies as of 04/19/2024 - Review Complete 04/19/2024  Allergen Reaction Noted   Amiodarone Other (See Comments) 12/11/2014   Latex Other (See Comments) 09/09/2022   Propafenone  05/10/2017   Gluten meal  08/01/2019   Hydrocodone Nausea And Vomiting 11/24/2014   Propafenone hcl  09/29/2017   Verapamil   10/11/2020       Review of Systems   General: Negative for anorexia, weight loss, fever, chills, fatigue, weakness. ENT: Negative for  hoarseness, difficulty swallowing , nasal congestion. CV: Negative for chest pain, angina, palpitations, dyspnea on exertion, peripheral edema.  See HPI Respiratory: Negative for dyspnea at rest, dyspnea on exertion, cough, sputum, wheezing.  GI: See history of present illness. GU:  Negative for dysuria, hematuria, urinary incontinence, urinary frequency, nocturnal urination.  Endo: Negative for unusual weight change.     Physical Exam   BP 120/70 (BP Location: Right Arm, Patient Position: Sitting, Cuff Size: Large)   Pulse 61   Temp 98.2 F (36.8 C) (Oral)   Ht 5\' 5"  (1.651 m)   Wt 262 lb 6.4 oz (119 kg)   LMP 04/16/2016  (Exact Date)   SpO2 96%   BMI 43.67 kg/m    General: Well-nourished, well-developed in no acute distress.  Eyes: No icterus. Mouth: Oropharyngeal mucosa moist and pink   Abdomen: Bowel sounds are normal, nontender, nondistended, no hepatosplenomegaly or masses,  no abdominal bruits or hernia , no rebound or guarding.  Rectal: not performed   Extremities: No lower extremity edema. No clubbing or deformities. Neuro: Alert and oriented x 4   Skin: Warm and dry, no jaundice.   Psych: Alert and cooperative, normal mood and affect.  Labs   Lab Results  Component Value Date   NA 141 01/17/2024   CL 108 01/17/2024   K 4.6 01/17/2024   CO2 24 01/17/2024   BUN 17 01/17/2024   CREATININE 0.79 01/17/2024   GFRNONAA >60 01/17/2024   CALCIUM 9.3 01/17/2024   ALBUMIN 4.3 04/17/2024   GLUCOSE 116 (H) 01/17/2024   Lab Results  Component Value Date   ALT 34 (H) 04/17/2024   AST 24 04/17/2024   ALKPHOS 95 04/17/2024   BILITOT 0.4 04/17/2024   Lab Results  Component Value Date   WBC 11.5 (H) 01/17/2024   HGB 15.0 01/17/2024   HCT 45.4 01/17/2024   MCV 93.6 01/17/2024   PLT 358 01/17/2024    Imaging Studies   No results found.  Assessment/Plan:   Reflux esophagitis: Doing well - Continue pantoprazole  40 mg daily before breakfast - Reinforced antireflux measures  Abdominal pain: Resolved with PPI - Monitor for recurrent symptoms  Celiac disease: Duodenal biopsies last year indicating fairly good control of her symptoms histologically.  TTG IgA 4 months prior to her endoscopy was elevated at 81, suspecting accidental gluten ingestion/cross-contamination.  At diagnosis her TTG IgA was over 100 several years back.  Last year we did check her vitamin D , iron, folate, PT/INR with no evidence of nutritional deficiencies. 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  We briefly discussed use of GLP-1 for weight loss purposes and that this could improve hepatic steatosis.  This would need to be provided by her PCP.  We also discussed possible referral to healthy weight and wellness in the future.  At this time she would like to pursue weight loss on her own.  Return office visit in 6 months  Trudie Fuse. Harles Lied, MHS, PA-C [facility]

## 2024-04-23 ENCOUNTER — Encounter (HOSPITAL_COMMUNITY): Payer: Self-pay | Admitting: Obstetrics and Gynecology

## 2024-04-23 ENCOUNTER — Encounter: Payer: Self-pay | Admitting: Obstetrics and Gynecology

## 2024-04-23 NOTE — Telephone Encounter (Signed)
 Call placed to patient, left detailed message, ok per dpr. Advised per Dr. Andrena Ke. Encouraged patient to see PCP or Urgent Care ASAP. Return call to [place], [place] at [phone], opt 5 to provide update.

## 2024-04-23 NOTE — Progress Notes (Signed)
 Spoke w/ via phone for pre-op interview--- Debbie Lab needs dos----   BMP per anesthesia      Lab results------Current EKG in Epic dated 01/30/24 COVID test -----patient states asymptomatic no test needed Arrive at -------0630 NPO after MN NO Solid Food.   Pre-Surgery Ensure or G2:  Med rec completed Medications to take morning of surgery ----- Protonix , Levothyroxine  and Metoprolol . Diabetic medication -----  GLP1 agonist last dose: GLP1 instructions:  Patient instructed no nail polish to be worn day of surgery Patient instructed to bring photo id and insurance card day of surgery Patient aware to have Driver (ride ) / caregiver    for 24 hours after surgery - Husband Ray [facility] Patient Special Instructions ----- shower with antibacterial soap. Hold Eliquis  2 days prior to procedure, pt verbalized understanding of these instructions. Pre-Op special Instructions -----  Patient verbalized understanding of instructions that were given at this phone interview. Patient denies chest pain, sob, fever, cough at the interview.   Cardiac clearance in Epic dated 03/18/24 by Atlas Lea, NP . Pt cardiologist Dr Addie Holstein.

## 2024-04-25 NOTE — Telephone Encounter (Signed)
 Dr. Andrena Ke, routing FYI.   If results come back on 5/2, please send to Miranda to cancel surgery if needed. I will be out of the office. Elon Hakim is aware.

## 2024-04-28 NOTE — Anesthesia Preprocedure Evaluation (Signed)
 Anesthesia Evaluation  Patient identified by MRN, date of birth, ID band Patient awake    Reviewed: Allergy & Precautions, NPO status , Patient's Chart, lab work & pertinent test results  History of Anesthesia Complications Negative for: history of anesthetic complications  Airway Mallampati: I  TM Distance: >3 FB Neck ROM: Full    Dental  (+) Dental Advisory Given   Pulmonary neg shortness of breath, sleep apnea and Continuous Positive Airway Pressure Ventilation , neg COPD, neg recent URI Cough with occasional small amount of phlegm. Denies fever and all other symptoms. Testing negative.   Pulmonary exam normal breath sounds clear to auscultation       Cardiovascular (-) hypertension(-) angina (-) Past MI, (-) Cardiac Stents and (-) CABG + dysrhythmias (PVCs, RBBB) Atrial Fibrillation and Supra Ventricular Tachycardia  Rhythm:Regular Rate:Normal  TEE 09/14/2022: IMPRESSIONS    1. Left ventricular ejection fraction, by estimation, is 60 to 65%. The  left ventricle has normal function. The left ventricle has no regional  wall motion abnormalities.   2. Right ventricular systolic function is normal. The right ventricular  size is normal. There is normal pulmonary artery systolic pressure. The  estimated right ventricular systolic pressure is 24.9 mmHg.   3. Left atrial size was moderately dilated. No left atrial/left atrial  appendage thrombus was detected.   4. The mitral valve is normal in structure. Trivial mitral valve  regurgitation. No evidence of mitral stenosis.   5. Tricuspid valve regurgitation is moderate.   6. The aortic valve is tricuspid. Aortic valve regurgitation is not  visualized. No aortic stenosis is present.   7. The inferior vena cava is normal in size with greater than 50%  respiratory variability, suggesting right atrial pressure of 3 mmHg.     Neuro/Psych neg Seizures PSYCHIATRIC DISORDERS Anxiety      Right facial numbness TIA (x2)   GI/Hepatic ,GERD  Medicated,,Fatty liver Celiac disease   Endo/Other  neg diabetesHypothyroidism  Class 3 obesity  Renal/GU negative Renal ROS     Musculoskeletal  (+) Arthritis ,    Abdominal  (+) + obese  Peds  Hematology  (+) Blood dyscrasia, anemia Lab Results      Component                Value               Date                      WBC                      11.5 (H)            01/17/2024                HGB                      15.0                01/17/2024                HCT                      45.4                01/17/2024                MCV  93.6                01/17/2024                PLT                      358                 01/17/2024              Anesthesia Other Findings Mixed connective tissue disease  Last Eliquis : 04/26/2024  Reproductive/Obstetrics                             Anesthesia Physical Anesthesia Plan  ASA: 3  Anesthesia Plan: General   Post-op Pain Management: Tylenol  PO (pre-op)*   Induction: Intravenous  PONV Risk Score and Plan: 3 and Ondansetron , Dexamethasone and Treatment may vary due to age or medical condition  Airway Management Planned: LMA  Additional Equipment:   Intra-op Plan:   Post-operative Plan: Extubation in OR  Informed Consent: I have reviewed the patients History and Physical, chart, labs and discussed the procedure including the risks, benefits and alternatives for the proposed anesthesia with the patient or authorized representative who has indicated his/her understanding and acceptance.     Dental advisory given  Plan Discussed with: Anesthesiologist and CRNA  Anesthesia Plan Comments: (Risks of general anesthesia discussed including, but not limited to, sore throat, hoarse voice, chipped/damaged teeth, injury to vocal cords, nausea and vomiting, allergic reactions, lung infection, heart attack, stroke, and death. All  questions answered. )       Anesthesia Quick Evaluation

## 2024-04-29 ENCOUNTER — Encounter: Payer: Self-pay | Admitting: Cardiology

## 2024-04-29 ENCOUNTER — Ambulatory Visit (HOSPITAL_BASED_OUTPATIENT_CLINIC_OR_DEPARTMENT_OTHER): Payer: Self-pay | Admitting: Anesthesiology

## 2024-04-29 ENCOUNTER — Ambulatory Visit (HOSPITAL_COMMUNITY): Payer: Self-pay | Admitting: Anesthesiology

## 2024-04-29 ENCOUNTER — Ambulatory Visit (HOSPITAL_COMMUNITY)
Admission: RE | Admit: 2024-04-29 | Discharge: 2024-04-29 | Disposition: A | Discharge status: Home / Self Care | Attending: Obstetrics and Gynecology | Admitting: Obstetrics and Gynecology

## 2024-04-29 ENCOUNTER — Encounter (HOSPITAL_COMMUNITY)
Admission: RE | Disposition: A | Discharge status: Home / Self Care | Payer: Self-pay | Source: Home / Self Care | Attending: Obstetrics and Gynecology

## 2024-04-29 ENCOUNTER — Encounter (HOSPITAL_COMMUNITY): Payer: Self-pay | Admitting: Obstetrics and Gynecology

## 2024-04-29 ENCOUNTER — Encounter: Payer: Self-pay | Admitting: Obstetrics and Gynecology

## 2024-04-29 ENCOUNTER — Other Ambulatory Visit: Payer: Self-pay

## 2024-04-29 DIAGNOSIS — K900 Celiac disease: Secondary | ICD-10-CM | POA: Diagnosis not present

## 2024-04-29 DIAGNOSIS — N950 Postmenopausal bleeding: Secondary | ICD-10-CM

## 2024-04-29 DIAGNOSIS — Z6841 Body Mass Index (BMI) 40.0 and over, adult: Secondary | ICD-10-CM | POA: Insufficient documentation

## 2024-04-29 DIAGNOSIS — G4733 Obstructive sleep apnea (adult) (pediatric): Secondary | ICD-10-CM | POA: Insufficient documentation

## 2024-04-29 DIAGNOSIS — K219 Gastro-esophageal reflux disease without esophagitis: Secondary | ICD-10-CM | POA: Diagnosis not present

## 2024-04-29 DIAGNOSIS — N8003 Adenomyosis of the uterus: Secondary | ICD-10-CM | POA: Diagnosis not present

## 2024-04-29 DIAGNOSIS — I4819 Other persistent atrial fibrillation: Secondary | ICD-10-CM | POA: Diagnosis not present

## 2024-04-29 DIAGNOSIS — N9489 Other specified conditions associated with female genital organs and menstrual cycle: Secondary | ICD-10-CM | POA: Diagnosis present

## 2024-04-29 DIAGNOSIS — Z01810 Encounter for preprocedural cardiovascular examination: Secondary | ICD-10-CM

## 2024-04-29 DIAGNOSIS — R9389 Abnormal findings on diagnostic imaging of other specified body structures: Secondary | ICD-10-CM | POA: Insufficient documentation

## 2024-04-29 DIAGNOSIS — E66813 Obesity, class 3: Secondary | ICD-10-CM | POA: Diagnosis not present

## 2024-04-29 HISTORY — PX: HYSTEROSCOPY WITH D & C: SHX1775

## 2024-04-29 LAB — BASIC METABOLIC PANEL WITH GFR
Anion gap: 10 (ref 5–15)
BUN: 14 mg/dL (ref 8–23)
CO2: 25 mmol/L (ref 22–32)
Calcium: 9.5 mg/dL (ref 8.9–10.3)
Chloride: 106 mmol/L (ref 98–111)
Creatinine, Ser: 0.81 mg/dL (ref 0.44–1.00)
GFR, Estimated: >60 mL/min (ref >60)
Glucose, Bld: 96 mg/dL (ref 70–99)
Potassium: 4.1 mmol/L (ref 3.5–5.1)
Sodium: 141 mmol/L (ref 135–145)

## 2024-04-29 SURGERY — DILATATION AND CURETTAGE /HYSTEROSCOPY
Anesthesia: General

## 2024-04-29 MED ORDER — ORAL CARE MOUTH RINSE: 15.0000 mL | Freq: Once | OROMUCOSAL | Status: AC

## 2024-04-29 MED ORDER — FENTANYL CITRATE (PF) 100 MCG/2ML IJ SOLN: 25.0000 ug | INTRAMUSCULAR | Status: DC | PRN

## 2024-04-29 MED ORDER — FENTANYL CITRATE (PF) 250 MCG/5ML IJ SOLN
INTRAMUSCULAR | Status: DC | PRN
  Administered 2024-04-29: 50 ug via INTRAVENOUS
  Administered 2024-04-29 (×2): 25 ug via INTRAVENOUS

## 2024-04-29 MED ORDER — AMISULPRIDE (ANTIEMETIC) 5 MG/2ML IV SOLN: 10.0000 mg | Freq: Once | INTRAVENOUS | Status: DC | PRN

## 2024-04-29 MED ORDER — KETOROLAC TROMETHAMINE 15 MG/ML IJ SOLN
INTRAMUSCULAR | Status: DC | PRN
  Administered 2024-04-29: 15 mg via INTRAVENOUS

## 2024-04-29 MED ORDER — PROPOFOL 10 MG/ML IV BOLUS
INTRAVENOUS | Status: DC | PRN
Start: 2024-04-29 — End: 2024-04-29
  Administered 2024-04-29: 150 mg via INTRAVENOUS

## 2024-04-29 MED ORDER — LIDOCAINE 2% (20 MG/ML) 5 ML SYRINGE
INTRAMUSCULAR | Status: DC | PRN
  Administered 2024-04-29: 100 mg via INTRAVENOUS

## 2024-04-29 MED ORDER — ONDANSETRON HCL 4 MG/2ML IJ SOLN
INTRAMUSCULAR | Status: AC
  Filled 2024-04-29: qty 2

## 2024-04-29 MED ORDER — LACTATED RINGERS IV SOLN: INTRAVENOUS | Status: DC

## 2024-04-29 MED ORDER — LIDOCAINE 2% (20 MG/ML) 5 ML SYRINGE
INTRAMUSCULAR | Status: AC
Start: 2024-04-29 — End: ?
  Filled 2024-04-29: qty 5

## 2024-04-29 MED ORDER — LIDOCAINE HCL (PF) 1 % IJ SOLN
INTRAMUSCULAR | Status: DC | PRN
  Administered 2024-04-29: 10 mL

## 2024-04-29 MED ORDER — SILVER NITRATE-POT NITRATE 75-25 % EX MISC
CUTANEOUS | Status: DC | PRN
  Administered 2024-04-29: 2

## 2024-04-29 MED ORDER — CHLORHEXIDINE GLUCONATE 0.12 % MT SOLN
OROMUCOSAL | Status: AC
  Filled 2024-04-29: qty 15

## 2024-04-29 MED ORDER — PROPOFOL 10 MG/ML IV BOLUS
INTRAVENOUS | Status: AC
  Filled 2024-04-29: qty 20

## 2024-04-29 MED ORDER — DEXAMETHASONE SODIUM PHOSPHATE 10 MG/ML IJ SOLN
INTRAMUSCULAR | Status: AC
  Filled 2024-04-29: qty 1

## 2024-04-29 MED ORDER — ACETAMINOPHEN 500 MG PO TABS
ORAL_TABLET | ORAL | Status: DC
Start: 2024-04-29 — End: 2024-04-29
  Filled 2024-04-29: qty 2

## 2024-04-29 MED ORDER — ONDANSETRON HCL 4 MG/2ML IJ SOLN
INTRAMUSCULAR | Status: DC | PRN
  Administered 2024-04-29: 4 mg via INTRAVENOUS

## 2024-04-29 MED ORDER — SODIUM CHLORIDE 0.9 % IR SOLN
Status: DC | PRN
  Administered 2024-04-29: 3000 mL

## 2024-04-29 MED ORDER — KETOROLAC TROMETHAMINE 30 MG/ML IJ SOLN
INTRAMUSCULAR | Status: AC
Start: 2024-04-29 — End: ?
  Filled 2024-04-29: qty 1

## 2024-04-29 MED ORDER — DEXAMETHASONE SODIUM PHOSPHATE 10 MG/ML IJ SOLN
INTRAMUSCULAR | Status: DC | PRN
  Administered 2024-04-29: 5 mg via INTRAVENOUS

## 2024-04-29 MED ORDER — LIDOCAINE 2% (20 MG/ML) 5 ML SYRINGE
INTRAMUSCULAR | Status: AC
  Filled 2024-04-29: qty 5

## 2024-04-29 MED ORDER — ACETAMINOPHEN 500 MG PO TABS
1000.0000 mg | ORAL_TABLET | ORAL | Status: AC
Start: 2024-04-29 — End: 2024-04-29
  Administered 2024-04-29: 1000 mg via ORAL

## 2024-04-29 MED ORDER — FENTANYL CITRATE (PF) 250 MCG/5ML IJ SOLN
INTRAMUSCULAR | Status: AC
  Filled 2024-04-29: qty 5

## 2024-04-29 MED ORDER — CHLORHEXIDINE GLUCONATE 0.12 % MT SOLN
15.0000 mL | Freq: Once | OROMUCOSAL | Status: AC
  Administered 2024-04-29: 15 mL via OROMUCOSAL

## 2024-04-29 MED ORDER — LIDOCAINE HCL (PF) 1 % IJ SOLN
INTRAMUSCULAR | Status: AC
Start: 2024-04-29 — End: ?
  Filled 2024-04-29: qty 30

## 2024-04-29 SURGICAL SUPPLY — 18 items
CATH FOLEY LF 16FR (CATHETERS) IMPLANT
DEVICE MYOSURE LITE (MISCELLANEOUS) IMPLANT
DEVICE MYOSURE REACH (MISCELLANEOUS) IMPLANT
DILATOR CANAL MILEX (MISCELLANEOUS) IMPLANT
GAUZE 4X4 16PLY ~~LOC~~+RFID DBL (SPONGE) IMPLANT
GLOVE BIO SURGEON STRL SZ7 (GLOVE) ×1 IMPLANT
GLOVE BIOGEL PI IND STRL 7.0 (GLOVE) ×1 IMPLANT
GOWN STRL REUS W/ TWL XL LVL3 (GOWN DISPOSABLE) ×1 IMPLANT
HIBICLENS CHG 4% 4OZ BTL (MISCELLANEOUS) ×1 IMPLANT
KIT PROCEDURE FLUENT (KITS) ×1 IMPLANT
KIT TURNOVER KIT B (KITS) ×1 IMPLANT
PACK VAGINAL MINOR WOMEN LF (CUSTOM PROCEDURE TRAY) ×1 IMPLANT
PAD OB MATERNITY 11 LF (PERSONAL CARE ITEMS) ×1 IMPLANT
PAD PREP 24X48 CUFFED NSTRL (MISCELLANEOUS) ×1 IMPLANT
SEAL ROD LENS SCOPE MYOSURE (ABLATOR) ×1 IMPLANT
SOL .9 NS 3000ML IRR UROMATIC (IV SOLUTION) ×1 IMPLANT
SYSTEM TISS REMOVAL MYOSURE XL (MISCELLANEOUS) IMPLANT
TOWEL OR 17X24 6PK STRL BLUE (TOWEL DISPOSABLE) ×1 IMPLANT

## 2024-04-29 NOTE — Telephone Encounter (Signed)
 Surgery completed 04/29/24.   Encounter closed

## 2024-04-29 NOTE — Transfer of Care (Signed)
 Immediate Anesthesia Transfer of Care Note  Patient: Maria Meza  Procedure(s) Performed: DILATATION AND CURETTAGE Larri Ply  Patient Location: PACU  Anesthesia Type:General  Level of Consciousness: awake, alert , and oriented  Airway & Oxygen Therapy: Patient Spontanous Breathing and Patient connected to face mask oxygen  Post-op Assessment: Report given to RN, Post -op Vital signs reviewed and stable, and Patient moving all extremities X 4  Post vital signs: Reviewed and stable  Last Vitals:  Vitals Value Taken Time  BP 151/90 04/29/24 0930  Temp 36.5 C 04/29/24 0930  Pulse 63 04/29/24 0937  Resp 17 04/29/24 0937  SpO2 92 % 04/29/24 0937  Vitals shown include unfiled device data.  Last Pain:  Vitals:   04/29/24 0700  TempSrc: Oral  PainSc: 0-No pain      Patients Stated Pain Goal: 6 (04/29/24 0700)  Complications: No notable events documented.

## 2024-04-29 NOTE — Discharge Instructions (Signed)
 No ibuprofen, Advil, Aleve, Motrin, ketorolac, meloxicam, naproxen, or other NSAIDS until after 3:15 pm today if needed.  No acetaminophen /Tylenol  until after 1:15 pm today if needed.     Post Anesthesia Home Care Instructions  Activity: Get plenty of rest for the remainder of the day. A responsible individual must stay with you for 24 hours following the procedure.  For the next 24 hours, DO NOT: -Drive a car -Advertising copywriter -Drink alcoholic beverages -Take any medication unless instructed by your physician -Make any legal decisions or sign important papers.  Meals: Start with liquid foods such as gelatin or soup. Progress to regular foods as tolerated. Avoid greasy, spicy, heavy foods. If nausea and/or vomiting occur, drink only clear liquids until the nausea and/or vomiting subsides. Call your physician if vomiting continues.  Special Instructions/Symptoms: Your throat may feel dry or sore from the anesthesia or the breathing tube placed in your throat during surgery. If this causes discomfort, gargle with warm salt water. The discomfort should disappear within 24 hours.

## 2024-04-29 NOTE — Interval H&P Note (Signed)
 Date of Initial H&P: 04/04/2024  History reviewed, patient examined, no change in status, stable for surgery.

## 2024-04-29 NOTE — Anesthesia Procedure Notes (Signed)
 Procedure Name: LMA Insertion Date/Time: 04/29/2024 8:56 AM  Performed by: Sherma Diver, CRNAPre-anesthesia Checklist: Patient identified, Emergency Drugs available, Suction available, Patient being monitored and Timeout performed Patient Re-evaluated:Patient Re-evaluated prior to induction Oxygen Delivery Method: Circle system utilized Preoxygenation: Pre-oxygenation with 100% oxygen Induction Type: IV induction Ventilation: Mask ventilation without difficulty LMA: LMA inserted Tube type: Oral Number of attempts: 1 Placement Confirmation: positive ETCO2 and breath sounds checked- equal and bilateral Tube secured with: Tape Dental Injury: Teeth and Oropharynx as per pre-operative assessment

## 2024-04-29 NOTE — Anesthesia Postprocedure Evaluation (Signed)
 Anesthesia Post Note  Patient: Maria Meza  Procedure(s) Performed: DILATATION AND CURETTAGE Larri Ply     Patient location during evaluation: PACU Anesthesia Type: General Level of consciousness: awake Pain management: pain level controlled Vital Signs Assessment: post-procedure vital signs reviewed and stable Respiratory status: spontaneous breathing, nonlabored ventilation and respiratory function stable Cardiovascular status: blood pressure returned to baseline and stable Postop Assessment: no apparent nausea or vomiting Anesthetic complications: no   No notable events documented.  Last Vitals:  Vitals:   04/29/24 0945 04/29/24 1000  BP: (!) 155/71 106/84  Pulse: 64 60  Resp: 15 12  Temp:    SpO2: (!) 89% 91%    Last Pain:  Vitals:   04/29/24 1000  TempSrc:   PainSc: 3                  Conard Decent

## 2024-04-29 NOTE — Op Note (Signed)
 04/29/2024 Maria Meza [id number]  OPERATIVE REPORT  Preop Diagnosis: Pre-Op Diagnosis Codes:     * PMB (postmenopausal bleeding) [N95.0]    * Thickened endometrium [R93.89]    * Endometrial mass [N94.89]   Post operative diagnosis: same  Procedure: Diagnostic hysteroscopy, dilation and curettage, polypectomy  Surgeon: Romaine Closs, MD Assistant: none  Anesthesia: MAC Fluids: please see anesthesia report Fluid deficit: 85cc  Complications: None  Findings: Normal cervix. Uterine cavity measuring 9cm with both ostia seen. Endometrial polyp present.  Estimated blood loss: Minimal  Specimens:  ID Type Source Tests Collected by Time Destination  1 : Endometrial polyp Tissue PATH Soft tissue SURGICAL PATHOLOGY Romaine Closs, MD 04/29/2024 [phone]   2 : [facility] Tissue PATH Soft tissue SURGICAL PATHOLOGY Romaine Closs, MD 04/29/2024 [phone]     Disposition of specimen: Pathology    Procedure: Patient was taken to the OR where she was placed in dorsal lithotomy in stirrups. She voided prior to transport. SCDs were in place.  The patient was prepped and draped in the usual sterile fashion. An adequate timeout was obtained and everyone agreed. A bivalve speculum was placed inside the vagina and the cervix visualized. The cervix was grasped anteriorly with a single-tooth tenaculum. Paracervical block was performed with 1% lidocaine .  The uterus sounded to 9 cm. Sequential cervical dilation was done to 25fr, and the myosure hysteroscope was introduced into the uterine cavity. Findings as noted. A small polyp was seen and resected using the myosure.  This was also used to sample the entire cavity.  A sharp curettage was then performed to ensure complete sampling of the cavity and was sent to pathology. All instrument, sponge and lap counts were correct x2. The patient was awakened taken to recovery room in stable condition.  Romaine Closs MD 04/29/2024 9:24 AM

## 2024-04-29 NOTE — Telephone Encounter (Signed)
 Routing to Dr. Andrena Ke to review and advise. Patient instructed to hold 2 days prior to surgery.

## 2024-04-30 ENCOUNTER — Ambulatory Visit (HOSPITAL_COMMUNITY)

## 2024-04-30 ENCOUNTER — Encounter (HOSPITAL_COMMUNITY): Payer: Self-pay | Admitting: Obstetrics and Gynecology

## 2024-04-30 LAB — SURGICAL PATHOLOGY

## 2024-05-01 ENCOUNTER — Encounter (HOSPITAL_COMMUNITY): Payer: Self-pay | Admitting: *Deleted

## 2024-05-01 ENCOUNTER — Other Ambulatory Visit: Payer: Self-pay

## 2024-05-01 ENCOUNTER — Emergency Department (HOSPITAL_COMMUNITY)

## 2024-05-01 ENCOUNTER — Observation Stay (HOSPITAL_COMMUNITY)
Admission: EM | Admit: 2024-05-01 | Discharge: 2024-05-02 | Disposition: A | Discharge status: Home / Self Care | Attending: Internal Medicine | Admitting: Internal Medicine

## 2024-05-01 DIAGNOSIS — H409 Unspecified glaucoma: Secondary | ICD-10-CM | POA: Insufficient documentation

## 2024-05-01 DIAGNOSIS — I4891 Unspecified atrial fibrillation: Principal | ICD-10-CM

## 2024-05-01 DIAGNOSIS — Z91040 Latex allergy status: Secondary | ICD-10-CM | POA: Diagnosis not present

## 2024-05-01 DIAGNOSIS — K219 Gastro-esophageal reflux disease without esophagitis: Secondary | ICD-10-CM | POA: Insufficient documentation

## 2024-05-01 DIAGNOSIS — Z7901 Long term (current) use of anticoagulants: Secondary | ICD-10-CM | POA: Insufficient documentation

## 2024-05-01 DIAGNOSIS — R002 Palpitations: Secondary | ICD-10-CM | POA: Diagnosis present

## 2024-05-01 DIAGNOSIS — E669 Obesity, unspecified: Secondary | ICD-10-CM | POA: Diagnosis not present

## 2024-05-01 DIAGNOSIS — Z8673 Personal history of transient ischemic attack (TIA), and cerebral infarction without residual deficits: Secondary | ICD-10-CM | POA: Insufficient documentation

## 2024-05-01 DIAGNOSIS — Z8616 Personal history of COVID-19: Secondary | ICD-10-CM | POA: Diagnosis not present

## 2024-05-01 DIAGNOSIS — I10 Essential (primary) hypertension: Secondary | ICD-10-CM | POA: Diagnosis not present

## 2024-05-01 DIAGNOSIS — E039 Hypothyroidism, unspecified: Secondary | ICD-10-CM | POA: Diagnosis not present

## 2024-05-01 LAB — CBC
HCT: 44.8 % (ref 36.0–46.0)
Hemoglobin: 14.8 g/dL (ref 12.0–15.0)
MCH: 31.2 pg (ref 26.0–34.0)
MCHC: 33 g/dL (ref 30.0–36.0)
MCV: 94.5 fL (ref 80.0–100.0)
Platelets: 379 10*3/uL (ref 150–400)
RBC: 4.74 MIL/uL (ref 3.87–5.11)
RDW: 13.5 % (ref 11.5–15.5)
WBC: 9.6 10*3/uL (ref 4.0–10.5)
nRBC: 0 % (ref 0.0–0.2)

## 2024-05-01 LAB — BASIC METABOLIC PANEL WITH GFR
Anion gap: 9 (ref 5–15)
BUN: 15 mg/dL (ref 8–23)
CO2: 27 mmol/L (ref 22–32)
Calcium: 9.8 mg/dL (ref 8.9–10.3)
Chloride: 102 mmol/L (ref 98–111)
Creatinine, Ser: 0.96 mg/dL (ref 0.44–1.00)
GFR, Estimated: >60 mL/min (ref >60)
Glucose, Bld: 89 mg/dL (ref 70–99)
Potassium: 3.8 mmol/L (ref 3.5–5.1)
Sodium: 138 mmol/L (ref 135–145)

## 2024-05-01 LAB — MRSA NEXT GEN BY PCR, NASAL: MRSA by PCR Next Gen: NOT DETECTED

## 2024-05-01 LAB — T4, FREE: Free T4: 1.44 ng/dL — ABNORMAL HIGH (ref 0.61–1.12)

## 2024-05-01 LAB — MAGNESIUM: Magnesium: 2.2 mg/dL (ref 1.7–2.4)

## 2024-05-01 LAB — PHOSPHORUS: Phosphorus: 3 mg/dL (ref 2.5–4.6)

## 2024-05-01 LAB — TSH: TSH: 1.426 u[IU]/mL (ref 0.350–4.500)

## 2024-05-01 MED ORDER — CHLORHEXIDINE GLUCONATE CLOTH 2 % EX PADS
6.0000 | MEDICATED_PAD | Freq: Every day | CUTANEOUS | Status: DC
  Administered 2024-05-01 – 2024-05-02 (×2): 6 via TOPICAL

## 2024-05-01 MED ORDER — METOPROLOL TARTRATE 5 MG/5ML IV SOLN
5.0000 mg | INTRAVENOUS | Status: AC | PRN
  Administered 2024-05-01 (×2): 5 mg via INTRAVENOUS
  Filled 2024-05-01 (×2): qty 5

## 2024-05-01 MED ORDER — ACETAMINOPHEN 325 MG PO TABS
650.0000 mg | ORAL_TABLET | Freq: Four times a day (QID) | ORAL | Status: DC | PRN
  Administered 2024-05-02: 650 mg via ORAL

## 2024-05-01 MED ORDER — APIXABAN 5 MG PO TABS
5.0000 mg | ORAL_TABLET | Freq: Two times a day (BID) | ORAL | Status: DC
  Administered 2024-05-01 – 2024-05-02 (×2): 5 mg via ORAL
  Filled 2024-05-01 (×2): qty 1

## 2024-05-01 MED ORDER — PROCHLORPERAZINE EDISYLATE 10 MG/2ML IJ SOLN: 10.0000 mg | Freq: Four times a day (QID) | INTRAMUSCULAR | Status: DC | PRN

## 2024-05-01 MED ORDER — SODIUM CHLORIDE 0.9 % IV SOLN: 250.0000 mL | INTRAVENOUS | Status: AC | PRN

## 2024-05-01 MED ORDER — LATANOPROST 0.005 % OP SOLN
1.0000 [drp] | Freq: Every day | OPHTHALMIC | Status: DC
  Administered 2024-05-01: 1 [drp] via OPHTHALMIC

## 2024-05-01 MED ORDER — PANTOPRAZOLE SODIUM 40 MG PO TBEC
40.0000 mg | DELAYED_RELEASE_TABLET | Freq: Every day | ORAL | Status: DC
  Administered 2024-05-01 – 2024-05-02 (×2): 40 mg via ORAL
  Filled 2024-05-01 (×2): qty 1

## 2024-05-01 MED ORDER — SODIUM CHLORIDE 0.9% FLUSH: 3.0000 mL | INTRAVENOUS | Status: DC | PRN

## 2024-05-01 MED ORDER — FOLIC ACID 1 MG PO TABS
2.0000 mg | ORAL_TABLET | Freq: Every day | ORAL | Status: DC
  Administered 2024-05-01 – 2024-05-02 (×2): 2 mg via ORAL
  Filled 2024-05-01 (×2): qty 2

## 2024-05-01 MED ORDER — METOPROLOL TARTRATE 5 MG/5ML IV SOLN
5.0000 mg | INTRAVENOUS | Status: AC
  Administered 2024-05-01: 5 mg via INTRAVENOUS
  Filled 2024-05-01: qty 5

## 2024-05-01 MED ORDER — DILTIAZEM HCL-DEXTROSE 125-5 MG/125ML-% IV SOLN (PREMIX)
5.0000 mg/h | INTRAVENOUS | Status: DC
  Administered 2024-05-01: 5 mg/h via INTRAVENOUS
  Filled 2024-05-01 (×2): qty 125

## 2024-05-01 MED ORDER — ACETAMINOPHEN 650 MG RE SUPP: 650.0000 mg | Freq: Four times a day (QID) | RECTAL | Status: DC | PRN

## 2024-05-01 MED ORDER — LACTATED RINGERS IV BOLUS
1000.0000 mL | Freq: Once | INTRAVENOUS | Status: AC
  Administered 2024-05-01: 1000 mL via INTRAVENOUS

## 2024-05-01 MED ORDER — SODIUM CHLORIDE 0.9% FLUSH
3.0000 mL | Freq: Two times a day (BID) | INTRAVENOUS | Status: DC
  Administered 2024-05-01 – 2024-05-02 (×3): 3 mL via INTRAVENOUS

## 2024-05-01 MED ORDER — LEVOTHYROXINE SODIUM 25 MCG PO TABS
137.0000 ug | ORAL_TABLET | Freq: Every day | ORAL | Status: DC
  Administered 2024-05-02: 137 ug via ORAL
  Filled 2024-05-01 (×2): qty 1

## 2024-05-01 MED ORDER — DIPHENHYDRAMINE-ZINC ACETATE 2-0.1 % EX CREA: TOPICAL_CREAM | Freq: Every day | CUTANEOUS | Status: DC | PRN

## 2024-05-01 NOTE — Progress Notes (Signed)
   05/01/24 1912  TOC Brief Assessment  Insurance and Status Reviewed  Patient has primary care physician Yes  Home environment has been reviewed From home  Prior level of function: Independent  Prior/Current Home Services No current home services  Social Drivers of Health Review SDOH reviewed no interventions necessary  Readmission risk has been reviewed Yes  Transition of care needs no transition of care needs at this time   Transition of Care Department [facility]) has reviewed patient and no other TOC needs have been identified at this time. We will continue to monitor patient advancement through interdisciplinary progression rounds. If new patient needs arise, please place a TOC consult.

## 2024-05-01 NOTE — ED Notes (Signed)
 See triage notes. Pt color wnl. Non diaphoretic. A/o

## 2024-05-01 NOTE — Plan of Care (Signed)

## 2024-05-01 NOTE — Progress Notes (Signed)
   05/01/24 2233  BiPAP/CPAP/SIPAP  $ Non-Invasive Ventilator  Non-Invasive Vent Set Up  $ Face Mask Medium Yes  BiPAP/CPAP/SIPAP Pt Type Adult  BiPAP/CPAP/SIPAP DREAMSTATIOND  Mask Type Nasal mask  Dentures removed? Not applicable  Mask Size Medium  Respiratory Rate 18 breaths/min  EPAP 10 cmH2O  FiO2 (%) 21 %  Patient Home Machine No  Patient Home Mask No  Patient Home Tubing No  Auto Titrate No  Device Plugged into RED Power Outlet Yes  BiPAP/CPAP /SiPAP Vitals  Pulse Rate (!) 121  Resp 18  SpO2 97 %  Bilateral Breath Sounds Clear;Diminished  MEWS Score/Color  MEWS Score 2  MEWS Score Color Yellow

## 2024-05-01 NOTE — ED Triage Notes (Addendum)
 Pt with hx of Afib, feels like her heart is racing.  Elevated BP per pt. + SOB and lightheadedness.  Mild pressure to chest. + new cough  Recent surgery on Monday-removal of polyp from uterus per pt.

## 2024-05-01 NOTE — ED Provider Notes (Signed)
[place] EMERGENCY DEPARTMENT AT [facility] Provider Note   CSN: [id number] Arrival date & time: 05/01/24  1100     History  Chief Complaint  Patient presents with   Atrial Fibrillation    Maria Meza is a 68 y.o. female.  68 year old female with history of TIA and atrial fibrillation on metoprolol  and Eliquis  presents emergency department with palpitations.  Patient reports that she had a D&C on Monday for a polyp.  Had to pause her Eliquis  for 2 days prior to the procedure and resumed it yesterday.  Says that at 9:30 AM she started feeling palpitations.  Last ate at 7:50 AM.  Has been having a cough since near Easter that has persisted.  Says she has been worked up for this and had a negative COVID and flu test already.  Says that last time she was given IV medication her heart rate got too low and so they had to cardiovert her.       Home Medications Prior to Admission medications   Medication Sig Start Date End Date Taking? Authorizing Provider  apixaban  (ELIQUIS ) 5 MG TABS tablet Take 1 tablet (5 mg total) by mouth 2 (two) times daily. 04/01/24   Arleen Lacer, MD  Ascorbic Acid  (VITAMIN C ) 1000 MG tablet Take 1,000 mg by mouth daily.    [provider]  b complex vitamins tablet Take by mouth daily.    [provider]  cholecalciferol (VITAMIN D3) 25 MCG (1000 UNIT) tablet Take 400 mcg by mouth daily.    [provider]  folic acid  (FOLVITE ) 1 MG tablet Take 2 mg by mouth daily. 2 tablets daily 04/21/11   [provider]  latanoprost  (XALATAN ) 0.005 % ophthalmic solution Place 1 drop into both eyes daily.     [provider]  levothyroxine  (SYNTHROID ) 137 MCG tablet Take 137 mcg by mouth daily. 09/15/23   [provider]  magnesium  oxide (MAG-OX) 400 MG tablet TAKE 1 TABLET BY MOUTH TWICE A DAY 12/04/23   Arleen Lacer, MD  metoprolol  tartrate (LOPRESSOR ) 25 MG tablet TAKE 1 TABLET BY MOUTH TWICE A DAY  12/04/23   Arleen Lacer, MD  mometasone (ELOCON) 0.1 % lotion Apply 1 application  topically.    [provider]  pantoprazole  (PROTONIX ) 40 MG tablet Take 1 tablet (40 mg total) by mouth daily. 11/02/23   Rourk, Windsor Hatcher, MD      Allergies    Amiodarone, Latex, Propafenone, Gluten meal, Hydrocodone, Propafenone hcl, and Verapamil     Review of Systems   Review of Systems  Physical Exam Updated Vital Signs BP 117/62   Pulse 84   Temp 98.2 F (36.8 C)   Resp 20   Ht 5\' 5"  (1.651 m)   Wt 116.3 kg   LMP 04/16/2016 (Exact Date)   SpO2 99%   BMI 42.67 kg/m  Physical Exam Vitals and nursing note reviewed.  Constitutional:      General: She is not in acute distress.    Appearance: She is well-developed.  HENT:     Head: Normocephalic and atraumatic.     Right Ear: External ear normal.     Left Ear: External ear normal.     Nose: Nose normal.  Eyes:     Extraocular Movements: Extraocular movements intact.     Conjunctiva/sclera: Conjunctivae normal.     Pupils: Pupils are equal, round, and reactive to light.  Cardiovascular:     Rate and Rhythm:  Tachycardia present. Rhythm irregular.     Heart sounds: No murmur heard. Pulmonary:     Effort: Pulmonary effort is normal. No respiratory distress.     Breath sounds: Normal breath sounds.  Musculoskeletal:     Cervical back: Normal range of motion and neck supple.     Right lower leg: No edema.     Left lower leg: No edema.  Skin:    General: Skin is warm and dry.  Neurological:     Mental Status: She is alert and oriented to person, place, and time. Mental status is at baseline.  Psychiatric:        Mood and Affect: Mood normal.     ED Results / Procedures / Treatments   Labs (all labs ordered are listed, but only abnormal results are displayed) Labs Reviewed  T4, FREE - Abnormal; Notable for the following components:      Result Value   Free T4 1.44 (*)    All other components within normal limits  MRSA  NEXT GEN BY PCR, NASAL  BASIC METABOLIC PANEL WITH GFR  MAGNESIUM   CBC  TSH  PHOSPHORUS  HIV ANTIBODY (ROUTINE TESTING W REFLEX)    EKG EKG Interpretation Date/Time:  Wednesday May 01 2024 11:20:02 EDT Ventricular Rate:  146 PR Interval:    QRS Duration:  147 QT Interval:  357 QTC Calculation: 499 R Axis:   103  Text Interpretation: Atrial fibrillation RBBB and LPFB Inferior infarct, age indeterminate Confirmed by Shyrl Doyne [phone]) on 05/01/2024 11:37:16 AM  Radiology DG Chest Port 1 View Result Date: 05/01/2024 CLINICAL DATA:  Palpitations.  History of atrial fibrillation. EXAM: PORTABLE CHEST 1 VIEW COMPARISON:  11/24/2022 FINDINGS: The cardiomediastinal contours are normal. The lungs are clear. Pulmonary vasculature is normal. No consolidation, pleural effusion, or pneumothorax. No acute osseous abnormalities are seen. IMPRESSION: No acute findings. Electronically Signed   By: Chadwick Colonel M.D.   On: 05/01/2024 12:21    Procedures Procedures    Medications Ordered in ED Medications  diltiazem  (CARDIZEM ) 125 mg in dextrose  5% 125 mL (1 mg/mL) infusion (5 mg/hr Intravenous New Bag/Given 05/01/24 1427)  sodium chloride flush (NS) 0.9 % injection 3 mL (has no administration in time range)  sodium chloride flush (NS) 0.9 % injection 3 mL (has no administration in time range)  0.9 %  sodium chloride infusion (has no administration in time range)  acetaminophen  (TYLENOL ) tablet 650 mg (has no administration in time range)    Or  acetaminophen  (TYLENOL ) suppository 650 mg (has no administration in time range)  prochlorperazine (COMPAZINE) injection 10 mg (has no administration in time range)  levothyroxine  (SYNTHROID ) tablet 137 mcg (has no administration in time range)  latanoprost  (XALATAN ) 0.005 % ophthalmic solution 1 drop (has no administration in time range)  pantoprazole  (PROTONIX ) EC tablet 40 mg (40 mg Oral Given 05/01/24 1601)  folic acid  (FOLVITE ) tablet 2 mg (2  mg Oral Given 05/01/24 1601)  apixaban  (ELIQUIS ) tablet 5 mg (has no administration in time range)  Chlorhexidine  Gluconate Cloth 2 % PADS 6 each (6 each Topical Given 05/01/24 1617)  lactated ringers  bolus 1,000 mL (0 mLs Intravenous Stopped 05/01/24 1246)  metoprolol  tartrate (LOPRESSOR ) injection 5 mg (5 mg Intravenous Given 05/01/24 1249)  metoprolol  tartrate (LOPRESSOR ) injection 5 mg (5 mg Intravenous Given 05/01/24 1405)    ED Course/ Medical Decision Making/ A&P Clinical Course as of 05/01/24 1634  Wed May 01, 2024  1244 Dr Londa Rival from cardiology consulted.  [RP]  1425 Dr Michaelene Admire from hospitalist consulted.  [RP]    Clinical Course User Index [RP] Ninetta Basket, MD                                 Medical Decision Making Amount and/or Complexity of Data Reviewed Labs: ordered. Radiology: ordered.  Risk Prescription drug management. Decision regarding hospitalization.   Maria Meza is a 68 y.o. female with comorbidities that complicate the patient evaluation including TIA and atrial fibrillation presents emergency room with palpitations  Initial Ddx:  Atrial fibrillation RVR, bradycardia, electrolyte abnormality, pneumonia  MDM/Course:  Patient presents emergency department palpitations.  That started 9:30 AM this morning.  Does have a history of paroxysmal atrial fibrillation has had palpitations like this before.  She was on Eliquis  but unfortunately had it held for 3 days and just resumed yesterday.  Does have a CHA2DS2-VASc score of 4.  Has had a TIA in the past.  On arrival her heart rate was documented as being in the 40s.  Now it is in the 140s to 160s.  Blood pressure is stable.  Unfortunately patient does not appear to be a good candidate for cardioversion because of her pausing anticoagulation and high CHADS2VASc score.  Patient also reports that she has had bradycardia in the past after she has been rate controlled.  She was given 3 doses of metoprolol  and was  still tachycardic.  Started on diltiazem  drip.  Cardiology updated.  Admitted to hospitalist.    This patient presents to the ED for concern of complaints listed in HPI, this involves an extensive number of treatment options, and is a complaint that carries with it a high risk of complications and morbidity. Disposition including potential need for admission considered.   Dispo: Admit to Floor  Additional history obtained from spouse Records reviewed Outpatient Clinic Notes The following labs were independently interpreted: Chemistry and show no acute abnormality I independently reviewed the following imaging with scope of interpretation limited to determining acute life threatening conditions related to emergency care: Chest x-ray and agree with the radiologist interpretation with the following exceptions: none I personally reviewed and interpreted cardiac monitoring: atrial fibrillation with RVR I personally reviewed and interpreted the pt's EKG: see above for interpretation  I have reviewed the patients home medications and made adjustments as needed Consults: Cardiology and Hospitalist Social Determinants of health:  Geriatric  Portions of this note were generated with Scientist, clinical (histocompatibility and immunogenetics). Dictation errors may occur despite best attempts at proofreading.     CRITICAL CARE Performed by: Ninetta Basket   Total critical care time: 45 minutes  Critical care time was exclusive of separately billable procedures and treating other patients.  Critical care was necessary to treat or prevent imminent or life-threatening deterioration.  Critical care was time spent personally by me on the following activities: development of treatment plan with patient and/or surrogate as well as nursing, discussions with consultants, evaluation of patient's response to treatment, examination of patient, obtaining history from patient or surrogate, ordering and performing treatments and  interventions, ordering and review of laboratory studies, ordering and review of radiographic studies, pulse oximetry and re-evaluation of patient's condition.   Final Clinical Impression(s) / ED Diagnoses Final diagnoses:  Atrial fibrillation with RVR [facility])    Rx / DC Orders ED Discharge Orders     None         Ninetta Basket, MD 05/01/24  1634  

## 2024-05-01 NOTE — H&P (Signed)
 History and Physical    Patient: Maria Meza:[id number] DOB: [birth year] DOA: 05/01/2024 DOS: the patient was seen and examined on 05/01/2024 PCP: Omie Bickers, MD   Patient coming from: Home  Chief Complaint:  Chief Complaint  Patient presents with   Atrial Fibrillation   HPI: Maria Meza is a 68 y.o. female with medical history significant of atrial fibrillation, GERD, Class III obesity, hypothyroidism, obstructive sleep apnea on CPAP, history of TIA and history of dysmenorrhea with recentCurettage and fibroids removal from her uterus on 04/30/23; who presented to the emergency department secondary to palpitations and associated dizziness and shortness of breath sensation.  Patient reported symptom has been present for the last 24 hours or so prior to admission; she expressed having to hold her anticoagulation therapy for recent surgical procedure for a bowel 3 days and has seen resume it again; patient expressed no associated chest pain, no bleeding, no fever, no productive coughing spells and no sick contacts.  In the ED patient was found to be in A-fib with RVR and has been started on Cardizem  drip.  Cardiology service consulted and [facility] contacted to place patient in the hospital for further evaluation and management.  Of note, patient has a prior history of symptomatic bradycardia in a previous hospitalization while using antiarrhythmics; this time she has unfortunately failed to control symptoms with home dose of metoprolol .  Patient expressed requiring cardioversion in the past to control A-fib.  Review of Systems: As mentioned in the history of present illness. All other systems reviewed and are negative. Past Medical History:  Diagnosis Date   Absolute anemia    No longer on iron supplementation.   Adult celiac disease 2018   Anxiety    Arrhythmia 06/2016   Event monitor has shown PVCs, PSVT at 1 short burst of NSVT.;  Event monitor from March 2021: 6 short runs of  wide-complex tachycardia and 25 runs of PSVT--intolerant of high-dose beta-blocker because of bradycardia.   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  (ANA positive, dsDNA negative)   Dysmenorrhea    Dysrhythmia    Elevated LFTs    Fatty liver    GERD (gastroesophageal reflux disease)    Glaucoma    History of COVID-19 02/19/2020   Hypothyroidism    IBS (irritable bowel syndrome)    Morbid obesity with BMI of 40.0-44.9, adult (HCC) 09/2020   BMI 43.07   MRSA infection 4-end-21   on abdomen   OSA on CPAP    Pulmonary hypertension (HCC)    PVC's (premature ventricular contractions)    Thyroid disease    hypothyroidism   TIA (transient ischemic attack)    2 per patient   Urinary incontinence    with sneezing, coughing   Past Surgical History:  Procedure Laterality Date   BIOPSY  11/01/2023   Procedure: BIOPSY;  Surgeon: Suzette Espy, MD;  Location: [facility] SUITE;  Service: Endoscopy;;   CARDIAC EVENT MONITOR  06/2016   [facility]) Noted PVCs, PSVT and NSVT (1 episode of 20 beats). ->  PT evaluation suggested PVCs appear to have been completely interpolated.  Felt to be septal   CARDIAC MRI -ADENOSINE / STRESS  05/28/2020   [facility]): EF 69%. Normal LV size,  thickness and function w/ NO RWMA  CO & CI 6.5 L/min & 3 L/min.      Normal RV size thickness and function.  No RV WMA or aneurysm.  No findings c/w  ARVC.  Both atria are mildly enlarged.  Trileaflet aortic valve with no evidence of stenosis.  Adenosine Stress Imaging: no  inducible myocardial ischemia OR prior infarction/scar or infiltrate. Normal AoV. Mid TR.   CARDIOVERSION N/A 09/14/2022   Procedure: CARDIOVERSION;  Surgeon: Hugh Madura, MD;  Location: [facility] ORS;  Service: Cardiovascular;   Laterality: N/A;   CATARACT EXTRACTION Right 03/2018   CATARACT EXTRACTION Left 03/2018   CHOLECYSTECTOMY     ESOPHAGOGASTRODUODENOSCOPY (EGD) WITH PROPOFOL  N/A 11/01/2023   Procedure: ESOPHAGOGASTRODUODENOSCOPY (EGD) WITH PROPOFOL ;  Surgeon: Suzette Espy, MD;  Location: [facility];  Service: Endoscopy;  Laterality: N/A;  1245PM, ASA 3   HYSTEROSCOPY WITH D & C N/A 04/29/2024   Procedure: DILATATION AND CURETTAGE Larri Ply;  Surgeon: Romaine Closs, MD;  Location: [facility] OR;  Service: Gynecology;  Laterality: N/A;  Myosure   LAPAROSCOPIC TUBAL LIGATION  1986   NUCLEAR STRESS TEST  05/2014   EF 55%.  Mixed anteroseptal defect consistent with breast attenuation artifact.   TEE WITHOUT CARDIOVERSION N/A 09/14/2022   Procedure: TRANSESOPHAGEAL ECHOCARDIOGRAM (TEE);  Surgeon: Hugh Madura, MD;  Location: [facility] ORS;  Service: Cardiovascular;  Laterality: N/A;   TRANSTHORACIC ECHOCARDIOGRAM  08/2016   [facility]- Normal LV size and function-EF~55%.  No RWMA..  Normal LA pressures.  Normal RV function.  Trivial AR, PR and TR.  No stenoses.    TRANSTHORACIC ECHOCARDIOGRAM  02/14/2022   Difficult images.  Normal EF 60 to 65%.  No RWMA.  Normal diastolic parameters.  Normal RV size and function.  Mild to moderate LA dilation.  Normal aortic and mitral valves.  Acing aorta measured roughly 38 mm.   ZIO PATCH CARDIAC EVENT MONITOR  02/2020   Predom Rhytym: SR - min 46 - max 113 bpm, avg 64 bpm. Rare isolated PVC & PACs. 6 short NSVT runs (8-11 beats, 134-245 bpm); 25 runs of  PAT/SVT - fastest 9 beats (200 bpm), longest 10.6 sec (130 bpm). Noted Sx w/ PAT & NSVT during daylight hours, and only once at midnight.   ZIO PATCH MONITOR  01/2022   Predominant SR: Rate range 46-99 bpm, avg 61 bpm.  Rare PACs & PVCs (& rare couplets).  No bigeminy/trigeminy.  (Sx noted w/ PACs and PVCs - & a few Atrial Runs).  35 Atrial Runs + 1 "V. tach "run of 4 Narrow Complex beats; fastest atrial run 5 beats @ 193 bpm &  longest 22 beats (12.1 sec) @ avg 108 bpm.   Social History:  reports that she has never smoked. She has never used smokeless tobacco. She reports that she does not drink alcohol and does not use drugs.  Allergies  Allergen Reactions   Amiodarone Other (See Comments)    "does not tolerate"--slows heart rate   Latex Other (See Comments)    blisters   Propafenone     Other Reaction(s): Other (See Comments)  Heart felt like it was going to stop; med did not work well.    Caused very low HR   Gluten Meal     Pt has celiac disease.    Hydrocodone Nausea And Vomiting   Propafenone Hcl     Caused very low HR   Verapamil      Fatigue, constipation  Family History  Problem Relation Age of Onset   Heart attack Father        dec age 56   Hypertension Father    Hypertension Mother    Hyperlipidemia Mother    Migraines Mother    Seizures Mother        epilepsy   Stroke Mother        TIA's   Thyroid disease Sister        hypothyroid   Thyroid disease Sister        hypothyroid   Rheum arthritis Maternal Grandmother    Migraines Maternal Grandmother    Cancer Maternal Grandfather 38       colon ca--DEC age 54   Diabetes Maternal Grandfather    Stroke Paternal Grandmother        multiple   Thyroid disease Paternal Grandmother        goiter-hypothyroid   Breast cancer Maternal Aunt     Prior to Admission medications   Medication Sig Start Date End Date Taking? Authorizing Provider  apixaban  (ELIQUIS ) 5 MG TABS tablet Take 1 tablet (5 mg total) by mouth 2 (two) times daily. 04/01/24  Yes Arleen Lacer, MD  Ascorbic Acid  (VITAMIN C ) 1000 MG tablet Take 1,000 mg by mouth daily.   Yes [provider]  b complex vitamins tablet Take by mouth daily.   Yes [provider]  cholecalciferol (VITAMIN D3) 25 MCG (1000 UNIT) tablet Take 400 mcg by mouth daily.   Yes [provider]  folic acid  (FOLVITE ) 1 MG tablet Take 2 mg by mouth daily. 2 tablets daily  04/21/11  Yes [provider]  latanoprost  (XALATAN ) 0.005 % ophthalmic solution Place 1 drop into both eyes daily.    Yes [provider]  levothyroxine  (SYNTHROID ) 137 MCG tablet Take 137 mcg by mouth daily. 09/15/23  Yes [provider]  magnesium  oxide (MAG-OX) 400 MG tablet TAKE 1 TABLET BY MOUTH TWICE A DAY 12/04/23  Yes Arleen Lacer, MD  metoprolol  tartrate (LOPRESSOR ) 25 MG tablet TAKE 1 TABLET BY MOUTH TWICE A DAY 12/04/23  Yes Arleen Lacer, MD  pantoprazole  (PROTONIX ) 40 MG tablet Take 1 tablet (40 mg total) by mouth daily. 11/02/23  Yes Suzette Espy, MD    Physical Exam: Vitals:   05/01/24 1600 05/01/24 1604 05/01/24 1700 05/01/24 2000  BP:  117/62 (!) 107/54 (!) 109/48  Pulse:  84 (!) 50 64  Resp: 20 20 20  (!) 21  Temp:    97.7 F (36.5 C)  TempSrc:    Axillary  SpO2:  99% 96% 97%  Weight:      Height:       General exam: Alert, awake, oriented x 3; in no major distress.  Expressed no chest pain. Respiratory system: No wheezing, no crackles, no using accessory muscle.  Good saturation on room air. Cardiovascular system: Irregularly irregular; no rubs, no gallops, no JVD. Gastrointestinal system: Abdomen is obese, nondistended, soft and nontender. No organomegaly or masses felt. Normal bowel sounds heard. Central nervous system: Moving 4 limbs spontaneously; following commands appropriately.  No focal neurological deficits. Extremities: No cyanosis, clubbing or edema. Skin: No petechiae. Psychiatry: Judgement and insight appear normal. Mood & affect appropriate.   Data Reviewed: MRSA PCR screen: Pending Basic metabolic panel: Sodium 138, potassium 3.8, chloride 102, bicarb 27, BUN 15, creatinine 0.96 and GFR >60 CBC: WBCs 9.6, hemoglobin 14.8 and platelet count 378K Magnesium : 2.2 TSH: 1.426   Assessment and  Plan: 1-A-fib with RVR - Continue Eliquis  for secondary prevention - Cardizem  drip has been started and will follow heart rate  response - Patient expressing no chest pain and no requiring oxygen supplementation currently - Cardiology service has been consulted and will follow any further recommendations.  2-history of hypertension - Currently stable/soft while using Cardizem  - Will hold any other antihypertensive agents - Follow vital signs. -Heart healthy/low-sodium diet discussed with patient  3-intraocular pressure/glaucoma - Continue treatment with xalatan   4-hypothyroidism - TSH within normal limits - Continue current dose of Synthroid .  5-GERD - Continue PPI  6-class III obesity with OSA -Body mass index is 42.67 kg/m. -Low-calorie diet and portion control discussed with patient - Continue CPAP nightly    Advance Care Planning:   Code Status: Full Code   Consults: Cardiology service has been consulted.  Family Communication: Husband and kids at bedside.  Severity of Illness: The appropriate patient status for this patient is OBSERVATION. Observation status is judged to be reasonable and necessary in order to provide the required intensity of service to ensure the patient's safety. The patient's presenting symptoms, physical exam findings, and initial radiographic and laboratory data in the context of their medical condition is felt to place them at decreased risk for further clinical deterioration. Furthermore, it is anticipated that the patient will be medically stable for discharge from the hospital within 2 midnights of admission.   Author: Justina Oman, MD 05/01/2024 9:53 PM  For on call review [email or web address].

## 2024-05-01 NOTE — Care Management Obs Status (Signed)
 MEDICARE OBSERVATION STATUS NOTIFICATION   Patient Details  Name: Maria Meza MRN: [id number] Date of Birth: 13-Jun-1956   Medicare Observation Status Notification Given:  Yes    Geraldina Klinefelter, RN 05/01/2024, 6:49 PM

## 2024-05-01 NOTE — ED Notes (Signed)
 Bp borderline low, unable to increase cardizem  at this time DR [facility] aware

## 2024-05-02 ENCOUNTER — Other Ambulatory Visit (HOSPITAL_COMMUNITY)

## 2024-05-02 ENCOUNTER — Observation Stay (HOSPITAL_COMMUNITY)

## 2024-05-02 ENCOUNTER — Observation Stay (HOSPITAL_COMMUNITY): Admitting: Certified Registered"

## 2024-05-02 ENCOUNTER — Encounter (HOSPITAL_COMMUNITY): Payer: Self-pay | Admitting: Internal Medicine

## 2024-05-02 ENCOUNTER — Encounter (HOSPITAL_COMMUNITY)
Admission: EM | Disposition: A | Discharge status: Home / Self Care | Payer: Self-pay | Source: Home / Self Care | Attending: Emergency Medicine

## 2024-05-02 DIAGNOSIS — Z8673 Personal history of transient ischemic attack (TIA), and cerebral infarction without residual deficits: Secondary | ICD-10-CM | POA: Diagnosis not present

## 2024-05-02 DIAGNOSIS — G4733 Obstructive sleep apnea (adult) (pediatric): Secondary | ICD-10-CM | POA: Diagnosis not present

## 2024-05-02 DIAGNOSIS — I10 Essential (primary) hypertension: Secondary | ICD-10-CM | POA: Diagnosis not present

## 2024-05-02 DIAGNOSIS — E669 Obesity, unspecified: Secondary | ICD-10-CM | POA: Diagnosis not present

## 2024-05-02 DIAGNOSIS — I340 Nonrheumatic mitral (valve) insufficiency: Secondary | ICD-10-CM | POA: Diagnosis not present

## 2024-05-02 DIAGNOSIS — I361 Nonrheumatic tricuspid (valve) insufficiency: Secondary | ICD-10-CM | POA: Diagnosis not present

## 2024-05-02 DIAGNOSIS — E039 Hypothyroidism, unspecified: Secondary | ICD-10-CM

## 2024-05-02 DIAGNOSIS — I4891 Unspecified atrial fibrillation: Secondary | ICD-10-CM

## 2024-05-02 DIAGNOSIS — K219 Gastro-esophageal reflux disease without esophagitis: Secondary | ICD-10-CM | POA: Diagnosis not present

## 2024-05-02 HISTORY — PX: TEE WITHOUT CARDIOVERSION: SHX5443

## 2024-05-02 HISTORY — PX: CARDIOVERSION: SHX1299

## 2024-05-02 LAB — COMPREHENSIVE METABOLIC PANEL WITH GFR
ALT: 30 U/L (ref 0–44)
AST: 20 U/L (ref 15–41)
Albumin: 3.6 g/dL (ref 3.5–5.0)
Alkaline Phosphatase: 62 U/L (ref 38–126)
Anion gap: 9 (ref 5–15)
BUN: 17 mg/dL (ref 8–23)
CO2: 25 mmol/L (ref 22–32)
Calcium: 9 mg/dL (ref 8.9–10.3)
Chloride: 108 mmol/L (ref 98–111)
Creatinine, Ser: 0.83 mg/dL (ref 0.44–1.00)
GFR, Estimated: >60 mL/min (ref >60)
Glucose, Bld: 88 mg/dL (ref 70–99)
Potassium: 4 mmol/L (ref 3.5–5.1)
Sodium: 142 mmol/L (ref 135–145)
Total Bilirubin: 0.7 mg/dL (ref 0.0–1.2)
Total Protein: 6.1 g/dL — ABNORMAL LOW (ref 6.5–8.1)

## 2024-05-02 LAB — MAGNESIUM: Magnesium: 2.1 mg/dL (ref 1.7–2.4)

## 2024-05-02 LAB — HIV ANTIBODY (ROUTINE TESTING W REFLEX): HIV Screen 4th Generation wRfx: NONREACTIVE

## 2024-05-02 LAB — ECHO TEE

## 2024-05-02 SURGERY — CARDIOVERSION
Anesthesia: General

## 2024-05-02 MED ORDER — PROPOFOL 10 MG/ML IV BOLUS
INTRAVENOUS | Status: DC | PRN
Start: 2024-05-02 — End: 2024-05-02
  Administered 2024-05-02: 40 mg via INTRAVENOUS
  Administered 2024-05-02: 50 mg via INTRAVENOUS
  Administered 2024-05-02: 40 mg via INTRAVENOUS
  Administered 2024-05-02: 30 mg via INTRAVENOUS

## 2024-05-02 MED ORDER — PHENYLEPHRINE 80 MCG/ML (10ML) SYRINGE FOR IV PUSH (FOR BLOOD PRESSURE SUPPORT)
PREFILLED_SYRINGE | INTRAVENOUS | Status: DC | PRN
Start: 2024-05-02 — End: 2024-05-02
  Administered 2024-05-02 (×6): 160 ug via INTRAVENOUS

## 2024-05-02 MED ORDER — PROPOFOL 500 MG/50ML IV EMUL
INTRAVENOUS | Status: DC | PRN
  Administered 2024-05-02: 85 ug/kg/min via INTRAVENOUS

## 2024-05-02 MED ORDER — METOPROLOL TARTRATE 25 MG PO TABS: 37.5000 mg | ORAL_TABLET | Freq: Two times a day (BID) | ORAL | 3 refills | Status: DC

## 2024-05-02 MED ORDER — GUAIFENESIN-DM 100-10 MG/5ML PO SYRP: 5.0000 mL | ORAL_SOLUTION | ORAL | Status: DC | PRN

## 2024-05-02 MED ORDER — LIDOCAINE 2% (20 MG/ML) 5 ML SYRINGE
INTRAMUSCULAR | Status: DC | PRN
  Administered 2024-05-02: 60 mg via INTRAVENOUS

## 2024-05-02 MED ORDER — LACTATED RINGERS IV SOLN
INTRAVENOUS | Status: DC | PRN
Start: 2024-05-02 — End: 2024-05-02

## 2024-05-02 MED ORDER — LIDOCAINE 2% (20 MG/ML) 5 ML SYRINGE
INTRAMUSCULAR | Status: AC
  Filled 2024-05-02: qty 5

## 2024-05-02 MED ORDER — BUTAMBEN-TETRACAINE-BENZOCAINE 2-2-14 % EX AERO
INHALATION_SPRAY | CUTANEOUS | Status: AC
  Filled 2024-05-02: qty 5

## 2024-05-02 MED ORDER — PROPOFOL 10 MG/ML IV BOLUS
INTRAVENOUS | Status: AC
  Filled 2024-05-02: qty 20

## 2024-05-02 MED ORDER — PHENYLEPHRINE 80 MCG/ML (10ML) SYRINGE FOR IV PUSH (FOR BLOOD PRESSURE SUPPORT)
PREFILLED_SYRINGE | INTRAVENOUS | Status: AC
  Filled 2024-05-02: qty 20

## 2024-05-02 MED ORDER — PROPOFOL 500 MG/50ML IV EMUL
INTRAVENOUS | Status: AC
  Filled 2024-05-02: qty 100

## 2024-05-02 NOTE — Anesthesia Preprocedure Evaluation (Signed)
 Anesthesia Evaluation  Patient identified by MRN, date of birth, ID band Patient awake    Reviewed: Allergy & Precautions, H&P , NPO status , Patient's Chart, lab work & pertinent test results, reviewed documented beta blocker date and time   Airway Mallampati: II  TM Distance: >3 FB Neck ROM: full    Dental no notable dental hx.    Pulmonary sleep apnea    Pulmonary exam normal breath sounds clear to auscultation       Cardiovascular Exercise Tolerance: Good hypertension, + dysrhythmias  Rhythm:regular Rate:Normal     Neuro/Psych   Anxiety     TIA negative psych ROS   GI/Hepatic Neg liver ROS,GERD  ,,  Endo/Other  Hypothyroidism    Renal/GU negative Renal ROS  negative genitourinary   Musculoskeletal   Abdominal   Peds  Hematology  (+) Blood dyscrasia, anemia   Anesthesia Other Findings   Reproductive/Obstetrics negative OB ROS                             Anesthesia Physical Anesthesia Plan  ASA: 4 and emergent  Anesthesia Plan: General   Post-op Pain Management:    Induction:   PONV Risk Score and Plan: Propofol  infusion  Airway Management Planned:   Additional Equipment:   Intra-op Plan:   Post-operative Plan:   Informed Consent: I have reviewed the patients History and Physical, chart, labs and discussed the procedure including the risks, benefits and alternatives for the proposed anesthesia with the patient or authorized representative who has indicated his/her understanding and acceptance.     Dental Advisory Given  Plan Discussed with: CRNA  Anesthesia Plan Comments:        Anesthesia Quick Evaluation

## 2024-05-02 NOTE — CV Procedure (Signed)
   TRANSESOPHAGEAL ECHOCARDIOGRAM GUIDED DIRECT CURRENT CARDIOVERSION  NAME:  Maria Meza    MRN: [id number] DOB:  1956-11-18    ADMIT DATE: 05/01/2024  INDICATIONS: Symptomatic atrial fibrillation with RVR  PROCEDURE:  Informed consent was obtained prior to the procedure. After a procedural time-out, the oropharynx was anesthetized and the patient was sedated by the anesthesia service. The transesophageal probe was inserted in the esophagus and stomach without difficulty and multiple views were obtained. Anesthesia was monitored by Dr. Margrette Shield. Once the TEE was complete, the patient had the defibrillator pads placed in the anterior and posterior position. Once an appropriate level of sedation was confirmed, the patient was cardioverted x 1 with 200J of biphasic synchronized energy.  The patient converted to NSR.  There were no apparent complications.  The patient had normal neuro status and respiratory status post procedure with vitals stable as recorded elsewhere.  Adequate airway was maintained throughout and vital signs monitored per protocol.  FINDINGS: No LAA thrombus. S/p 200 J x 1 with successful conversion to NSR.  RECOMMENDATIONS: Post DCCV EKG showed NSR.  Discontinue diltiazem  drip and increase metoprolol  tartarate dose from 25 mg to 37.5 mg BID. Continue Eliquis  5 mg BID. Do not interrupt for the next 4 weeks. Did not tolerate Amiodarone and Flecainide. Has f/u appt with Afib clinic on 05/10/2024, needs to discuss PVI due to strong family Hx of Afib with ablation as well as multiple DCCVs.    Petra Dumler Priya Michelle Wnek, MD [place]  [facility]  2:22 PM

## 2024-05-02 NOTE — H&P (View-Only) (Signed)
 CARDIOLOGY CONSULT NOTE    Patient ID: Maria Meza; [id number]; 1956/11/12   Admit date: 05/01/2024 Date of Consult: 05/02/2024  Primary Care Provider: Omie Bickers, MD Primary Cardiologist:  Primary Electrophysiologist:     History of Present Illness:   [name] is a 68 year old F known to have paroxysmal A-fib s/p multiple DCCV (last DCCV in January 2025), OSA on CPAP, history of TIA presented to the ER with sudden onset of palpitations.  Associated with SOB.  No other symptoms of chest pain, syncope or leg swelling.  She underwent uterine fibroid/polyp removal a few days ago and had to hold her Eliquis  for 2 days prior to the procedure.  Currently on diltiazem  7.5 mg/h and cannot uptitrate her dose due to soft blood pressures.  Continue Eliquis  5 mg twice daily.  Past Medical History:  Diagnosis Date   Absolute anemia    No longer on iron supplementation.   Adult celiac disease 2018   Anxiety    Arrhythmia 06/2016   Event monitor has shown PVCs, PSVT at 1 short burst of NSVT.;  Event monitor from March 2021: 6 short runs of wide-complex tachycardia and 25 runs of PSVT--intolerant of high-dose beta-blocker because of bradycardia.   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  (ANA positive, dsDNA negative)   Dysmenorrhea    Dysrhythmia    Elevated LFTs    Fatty liver    GERD (gastroesophageal reflux disease)    Glaucoma    History of COVID-19 02/19/2020   Hypothyroidism    IBS (irritable bowel syndrome)    Morbid obesity with BMI of 40.0-44.9, adult (HCC) 09/2020   BMI 43.07   MRSA infection 4-end-21   on abdomen   OSA on CPAP    Pulmonary hypertension (HCC)    PVC's (premature ventricular contractions)    Thyroid disease     hypothyroidism   TIA (transient ischemic attack)    2 per patient   Urinary incontinence    with sneezing, coughing    Past Surgical History:  Procedure Laterality Date   BIOPSY  11/01/2023   Procedure: BIOPSY;  Surgeon: Suzette Espy, MD;  Location: [facility];  Service: Endoscopy;;   CARDIAC EVENT MONITOR  06/2016   [facility]) Noted PVCs, PSVT and NSVT (1 episode of 20 beats). ->  PT evaluation suggested PVCs appear to have been completely interpolated.  Felt to be septal   CARDIAC MRI -ADENOSINE / STRESS  05/28/2020   [facility]): EF 69%. Normal LV size, thickness and function w/ NO RWMA  CO & CI 6.5 L/min & 3 L/min.      Normal RV size thickness and function.  No RV WMA or aneurysm.  No findings c/w  ARVC.  Both atria are mildly enlarged.  Trileaflet aortic valve with no evidence of stenosis.  Adenosine Stress Imaging: no  inducible myocardial ischemia OR prior infarction/scar or infiltrate. Normal AoV. Mid TR.   CARDIOVERSION N/A 09/14/2022   Procedure: CARDIOVERSION;  Surgeon: Hugh Madura, MD;  Location: [facility];  Service: Cardiovascular;  Laterality: N/A;   CATARACT EXTRACTION Right 03/2018   CATARACT EXTRACTION Left 03/2018   CHOLECYSTECTOMY     ESOPHAGOGASTRODUODENOSCOPY (EGD) WITH PROPOFOL  N/A 11/01/2023   Procedure: ESOPHAGOGASTRODUODENOSCOPY (  EGD) WITH PROPOFOL ;  Surgeon: Suzette Espy, MD;  Location: [facility];  Service: Endoscopy;  Laterality: N/A;  1245PM, ASA 3   HYSTEROSCOPY WITH D & C N/A 04/29/2024   Procedure: DILATATION AND CURETTAGE Larri Ply;  Surgeon: Romaine Closs, MD;  Location: [facility];  Service: Gynecology;  Laterality: N/A;  Myosure   LAPAROSCOPIC TUBAL LIGATION  1986   NUCLEAR STRESS TEST  05/2014   EF 55%.  Mixed anteroseptal defect consistent with breast attenuation artifact.   TEE WITHOUT CARDIOVERSION N/A 09/14/2022   Procedure: TRANSESOPHAGEAL ECHOCARDIOGRAM (TEE);  Surgeon: Hugh Madura, MD;  Location: [facility];  Service: Cardiovascular;   Laterality: N/A;   TRANSTHORACIC ECHOCARDIOGRAM  08/2016   [facility]- Normal LV size and function-EF~55%.  No RWMA..  Normal LA pressures.  Normal RV function.  Trivial AR, PR and TR.  No stenoses.    TRANSTHORACIC ECHOCARDIOGRAM  02/14/2022   Difficult images.  Normal EF 60 to 65%.  No RWMA.  Normal diastolic parameters.  Normal RV size and function.  Mild to moderate LA dilation.  Normal aortic and mitral valves.  Acing aorta measured roughly 38 mm.   ZIO PATCH CARDIAC EVENT MONITOR  02/2020   Predom Rhytym: SR - min 46 - max 113 bpm, avg 64 bpm. Rare isolated PVC & PACs. 6 short NSVT runs (8-11 beats, 134-245 bpm); 25 runs of  PAT/SVT - fastest 9 beats (200 bpm), longest 10.6 sec (130 bpm). Noted Sx w/ PAT & NSVT during daylight hours, and only once at midnight.   ZIO PATCH MONITOR  01/2022   Predominant SR: Rate range 46-99 bpm, avg 61 bpm.  Rare PACs & PVCs (& rare couplets).  No bigeminy/trigeminy.  (Sx noted w/ PACs and PVCs - & a few Atrial Runs).  35 Atrial Runs + 1 "V. tach "run of 4 Narrow Complex beats; fastest atrial run 5 beats @ 193 bpm & longest 22 beats (12.1 sec) @ avg 108 bpm.       Inpatient Medications: Scheduled Meds:  apixaban   5 mg Oral BID   Chlorhexidine  Gluconate Cloth  6 each Topical Daily   folic acid   2 mg Oral Daily   latanoprost   1 drop Both Eyes Daily   levothyroxine   137 mcg Oral Daily   pantoprazole   40 mg Oral Daily   sodium chloride flush  3 mL Intravenous Q12H   Continuous Infusions:  sodium chloride     diltiazem  (CARDIZEM ) infusion 7.5 mg/hr (05/02/24 0326)   PRN Meds: sodium chloride, acetaminophen  **OR** acetaminophen , diphenhydrAMINE-zinc acetate, prochlorperazine, sodium chloride flush  Allergies:    Allergies  Allergen Reactions   Amiodarone Other (See Comments)    "does not tolerate"--slows heart rate   Latex Other (See Comments)    blisters   Propafenone     Other Reaction(s): Other (See Comments)  Heart felt like it was going to stop;  med did not work well.    Caused very low HR   Gluten Meal     Pt has celiac disease.    Hydrocodone Nausea And Vomiting   Propafenone Hcl     Caused very low HR   Verapamil      Fatigue, constipation    Social History:   Social History   Socioeconomic History   Marital status: Married    Spouse name: Not on file   Number of children: Not on file   Years of education: Not on file   Highest education level: Not on  file  Occupational History   Not on file  Tobacco Use   Smoking status: Never   Smokeless tobacco: Never   Tobacco comments:    Never smoke 11/28/22  Vaping Use   Vaping status: Never Used  Substance and Sexual Activity   Alcohol use: No    Alcohol/week: 0.0 standard drinks of alcohol   Drug use: No   Sexual activity: Not Currently    Partners: Male    Birth control/protection: Post-menopausal    Comment: less than 5, after IC, no hx of STD, no abnormal pap, no DES  Other Topics Concern   Not on file  Social History Narrative   She lives in [place], [place]   Right handed    Caffeine use: 1 cup coffee every morning   Social Drivers of Health   Financial Resource Strain: Not on file  Food Insecurity: No Food Insecurity (05/01/2024)   Hunger Vital Sign    Worried About Running Out of Food in the Last Year: Never true    Ran Out of Food in the Last Year: Never true  Transportation Needs: No Transportation Needs (05/01/2024)   PRAPARE - Administrator, Civil Service (Medical): No    Lack of Transportation (Non-Medical): No  Physical Activity: Not on file  Stress: Not on file  Social Connections: Socially Integrated (05/01/2024)   Social Connection and Isolation Panel [NHANES]    Frequency of Communication with Friends and Family: More than three times a week    Frequency of Social Gatherings with Friends and Family: More than three times a week    Attends Religious Services: More than 4 times per year    Active Member of [employer] or Organizations:  Yes    Attends Engineer, structural: More than 4 times per year    Marital Status: Married  Catering manager Violence: Not At Risk (05/01/2024)   Humiliation, Afraid, Rape, and Kick questionnaire    Fear of Current or Ex-Partner: No    Emotionally Abused: No    Physically Abused: No    Sexually Abused: No    Family History:    Family History  Problem Relation Age of Onset   Heart attack Father        dec age 76   Hypertension Father    Hypertension Mother    Hyperlipidemia Mother    Migraines Mother    Seizures Mother        epilepsy   Stroke Mother        TIA's   Thyroid disease Sister        hypothyroid   Thyroid disease Sister        hypothyroid   Rheum arthritis Maternal Grandmother    Migraines Maternal Grandmother    Cancer Maternal Grandfather 3       colon ca--DEC age 19   Diabetes Maternal Grandfather    Stroke Paternal Grandmother        multiple   Thyroid disease Paternal Grandmother        goiter-hypothyroid   Breast cancer Maternal Aunt      ROS:  Please see the history of present illness.  ROS  All other ROS reviewed and negative.     Physical Exam/Data:   Vitals:   05/02/24 0630 05/02/24 0645 05/02/24 0700 05/02/24 0800  BP: (!) 125/48 116/60 (!) 118/49 118/66  Pulse: 89 77 77 85  Resp: 20 19 (!) 21 (!) 25  Temp:    97.9 F (  36.6 C)  TempSrc:    Oral  SpO2: 95% 92% 94% 97%  Weight:      Height:        Intake/Output Summary (Last 24 hours) at 05/02/2024 0934 Last data filed at 05/02/2024 0326 Gross per 24 hour  Intake 74.54 ml  Output --  Net 74.54 ml   Filed Weights   05/01/24 1108 05/01/24 1545  Weight: 117.9 kg 116.3 kg   Body mass index is 42.67 kg/m.  General:  Well nourished, well developed, in no acute distress HEENT: normal Lymph: no adenopathy Neck: no JVD Endocrine:  No thryomegaly Vascular: No carotid bruits; FA pulses 2+ bilaterally without bruits  Cardiac:  normal S1, S2; RRR; no murmur  Lungs:  clear to  auscultation bilaterally, no wheezing, rhonchi or rales  Abd: soft, nontender, no hepatomegaly  Ext: no edema Musculoskeletal:  No deformities, BUE and BLE strength normal and equal Skin: warm and dry  Neuro:  CNs 2-12 intact, no focal abnormalities noted Psych:  Normal affect   EKG:  The EKG was personally reviewed and demonstrates:   Telemetry:  Telemetry was personally reviewed and demonstrates:    Relevant CV Studies:   Laboratory Data:  Chemistry Recent Labs  Lab 04/29/24 0647 05/01/24 1120 05/02/24 0339  NA 141 138 142  K 4.1 3.8 4.0  CL 106 102 108  CO2 25 27 25   GLUCOSE 96 89 88  BUN 14 15 17   CREATININE 0.81 0.96 0.83  CALCIUM 9.5 9.8 9.0  GFRNONAA >60 >60 >60  ANIONGAP 10 9 9     Recent Labs  Lab 05/02/24 0339  PROT 6.1*  ALBUMIN 3.6  AST 20  ALT 30  ALKPHOS 62  BILITOT 0.7   Hematology Recent Labs  Lab 05/01/24 1120  WBC 9.6  RBC 4.74  HGB 14.8  HCT 44.8  MCV 94.5  MCH 31.2  MCHC 33.0  RDW 13.5  PLT 379   Cardiac EnzymesNo results for input(s): "TROPONINI" in the last 168 hours. No results for input(s): "TROPIPOC" in the last 168 hours.  BNPNo results for input(s): "BNP", "PROBNP" in the last 168 hours.  DDimer No results for input(s): "DDIMER" in the last 168 hours.  Radiology/Studies:  DG Chest Port 1 View Result Date: 05/01/2024 CLINICAL DATA:  Palpitations.  History of atrial fibrillation. EXAM: PORTABLE CHEST 1 VIEW COMPARISON:  11/24/2022 FINDINGS: The cardiomediastinal contours are normal. The lungs are clear. Pulmonary vasculature is normal. No consolidation, pleural effusion, or pneumothorax. No acute osseous abnormalities are seen. IMPRESSION: No acute findings. Electronically Signed   By: Chadwick Colonel M.D.   On: 05/01/2024 12:21    Assessment and Plan:   Atrial fibrillation with RVR : Presented with sudden onset of palpitations for 1 day, associated with SOB.  EKG showed A-fib with RVR.  Last DCCV in January 2025.  She has a  strong family history of A-fib and subsequent ablation in her brother, daughter and others as well.  Continue diltiazem  drip 7.5 mg/h, unable to uptitrate due to soft BP.  Continue Eliquis  5 mg daily (she had to interrupt Eliquis  a few days ago for uterine polyp removal).  N.p.o. today.  Will check with anesthesia to see if they have any spot open today for TEE-guided DCCV.  Patient agreeable to the plan.  She tried amiodarone and flecainide (according to the patient) and did not tolerate very well.  Due to strong family history of A-fib and PVI (in her brother, daughter and other family  members), she would strongly benefit from PVI.  But her BMI is 42, she will need to follow-up with her cardiologist or sleep medicine to see if she will benefit from Zepbound.  Will make sure she has a follow-up appointment with A-fib clinic upon discharge to discuss about PVI.  Informed consent for TEE guided DCCV Risks and benefits of the procedure are explained to the patient.  Risk include, but not limited to, sore throat, infection, pneumonia, esophageal rupture, cardiac rest, possibility of PPM implantation.  Patient comprehended these risks and agreed to proceed with the procedure.  OSA on CPAP: Continue CPAP nightly.  Compliant.  Congratulated.  History of TIA: Not on aspirin due to Eliquis  use, outpatient follow-up with PCP.   For questions or updates, please contact [facility] Please consult [email or web address] for contact info under Cardiology/STEMI.   Signed, Kelby Adell Priya Alhaji Mcneal, MD 05/02/2024 9:34 AM

## 2024-05-02 NOTE — Progress Notes (Signed)
 Electrical Cardioversion Procedure Note SHANZA MORESCHI [id number] Nov 19, 1956  Procedure: Electrical Cardioversion Indications:  Atrial Fibrillation  Procedure Details Consent: Risks of procedure as well as the alternatives and risks of each were explained to the (patient/caregiver).  Consent for procedure obtained. Time Out: Verified patient identification, verified procedure, site/side was marked, verified correct patient position, special equipment/implants available, medications/allergies/relevent history reviewed, required imaging and test results available.  Performed  Patient placed on cardiac monitor, pulse oximetry, supplemental oxygen as necessary.  Sedation given: propofol  Pacer pads placed anterior and posterior chest.  Cardioverted 1 time(s).  Cardioverted at 200J.  Evaluation Findings: Post procedure EKG shows: NSR Complications: None Patient did tolerate procedure well.   Louana Roup S 05/02/2024, 2:50 PM

## 2024-05-02 NOTE — Transfer of Care (Signed)
 Immediate Anesthesia Transfer of Care Note  Patient: Maria Meza  Procedure(s) Performed: CARDIOVERSION ECHOCARDIOGRAM, TRANSESOPHAGEAL  Patient Location: PACU  Anesthesia Type:General  Level of Consciousness: awake and patient cooperative  Airway & Oxygen Therapy: Patient Spontanous Breathing and Patient connected to nasal cannula oxygen  Post-op Assessment: Report given to RN and Post -op Vital signs reviewed and stable  Post vital signs: Reviewed and stable  Last Vitals:  Vitals Value Taken Time  BP 104/51 05/02/24 1430  Temp 36.7 C 05/02/24 1430  Pulse 78 05/02/24 1437  Resp 18 05/02/24 1437  SpO2 98 % 05/02/24 1437  Vitals shown include unfiled device data.  Last Pain:  Vitals:   05/02/24 1430  TempSrc:   PainSc: 0-No pain      Patients Stated Pain Goal: 0 (05/01/24 1604)  Complications: No notable events documented.

## 2024-05-02 NOTE — Interval H&P Note (Signed)
 History and Physical Interval Note:  05/02/2024 1:20 PM  Maria Meza  has presented today for surgery, with the diagnosis of a-fib.  The various methods of treatment have been discussed with the patient and family. After consideration of risks, benefits and other options for treatment, the patient has consented to  Procedure(s): CARDIOVERSION (N/A) ECHOCARDIOGRAM, TRANSESOPHAGEAL (N/A) as a surgical intervention.  The patient's history has been reviewed, patient examined, no change in status, stable for surgery.  I have reviewed the patient's chart and labs.  Questions were answered to the patient's satisfaction.     Henri Guedes P Amaris Garrette

## 2024-05-02 NOTE — Discharge Summary (Signed)
 Physician Discharge Summary   Patient: Maria Meza MRN: [id number] DOB: 1956-01-10  Admit date:     05/01/2024  Discharge date: 05/02/24  Discharge Physician: Justina Oman   PCP: Omie Bickers, MD   Recommendations at discharge:  Continue assisting patient with weight loss management Make sure patient follow-up with cardiology service as instructed Reassess blood pressure and if needed adjust antihypertensive regimen Repeat basic metabolic panel to follow electrolytes and renal function Repeat CBC to follow hemoglobin trend.   Discharge Diagnoses: Principal Problem:   Atrial fibrillation with RVR (HCC) Class III obesity Gastroesophageal reflux disease Hypothyroidism Increased intraocular pressure/glaucoma Hypertension  Assessment and Plan: 1-A-fib with RVR - Patient condition partially improved after initiating patient on Cardizem  drip - After discussing with cardiology service TEE with cardioversion were provided - Patient will continue the use of Eliquis  and her metoprolol  will be adjusted to 37.5 mg twice a day - Outpatient follow-up at the atrial fibrillation clinic with plans as an outpatient for ablation most likely. -Of note, patient with symptomatic bradycardia after using amiodarone and flecainide in the past.  2-history of hypertension - Continue adjusted dose of beta-blocker - Heart healthy/low-sodium diet discussed with patient.  3-hypothyroidism - TSH within normal limit - Continue Synthroid  dose.  4-GERD - Continue PPI.  5-class III obesity with obstructive sleep apnea - Low-calorie diet, portion control and increasing activity discussed with patient - Continue the use of CPAP nightly -Body mass index is 42.67 kg/m.   6-increased intraocular pressure/glaucoma - Continue treatment with xalatan  eye drops.  Consultants: Cardiology service Procedures performed: See below for x-ray report; TEE with cardioversion. Disposition: Home Diet  recommendation: Heart healthy/low calorie diet.  DISCHARGE MEDICATION: Allergies as of 05/02/2024       Reactions   Amiodarone Other (See Comments)   "does not tolerate"--slows heart rate   Latex Other (See Comments)   blisters   Propafenone    Other Reaction(s): Other (See Comments) Heart felt like it was going to stop; med did not work well.    Caused very low HR   Gluten Meal    Pt has celiac disease.    Hydrocodone Nausea And Vomiting   Propafenone Hcl    Caused very low HR   Verapamil     Fatigue, constipation        Medication List     TAKE these medications    apixaban  5 MG Tabs tablet Commonly known as: ELIQUIS  Take 1 tablet (5 mg total) by mouth 2 (two) times daily.   b complex vitamins tablet Take by mouth daily.   cholecalciferol 25 MCG (1000 UNIT) tablet Commonly known as: VITAMIN D3 Take 400 mcg by mouth daily.   folic acid  1 MG tablet Commonly known as: FOLVITE  Take 2 mg by mouth daily. 2 tablets daily   latanoprost  0.005 % ophthalmic solution Commonly known as: XALATAN  Place 1 drop into both eyes daily.   levothyroxine  137 MCG tablet Commonly known as: SYNTHROID  Take 137 mcg by mouth daily.   magnesium  oxide 400 MG tablet Commonly known as: MAG-OX TAKE 1 TABLET BY MOUTH TWICE A DAY   metoprolol  tartrate 25 MG tablet Commonly known as: LOPRESSOR  Take 1.5 tablets (37.5 mg total) by mouth 2 (two) times daily. What changed: how much to take   pantoprazole  40 MG tablet Commonly known as: PROTONIX  Take 1 tablet (40 mg total) by mouth daily.   vitamin C  1000 MG tablet Take 1,000 mg by mouth daily.  Follow-up Information     Fenton, Clint R, PA Follow up on 05/10/2024.   Specialty: Cardiology Why: at 10:30 AM. Contact information: [address] [place] [address] [phone]         Omie Bickers, MD. Schedule an appointment as soon as possible for a visit in 2 week(s).   Specialty: Internal Medicine Contact  information: [address] [place] [address] [phone]                Discharge Exam: Filed Weights   05/01/24 1108 05/01/24 1545 05/02/24 1242  Weight: 117.9 kg 116.3 kg 116.3 kg   General exam: Alert, awake, oriented x 3; in no acute distress and eager to go home. Respiratory system: Clear to auscultation. Respiratory effort normal.  Good saturation on room air. Cardiovascular system: Rate controlled, sinus rhythm appreciated at discharge.  No rubs, no gallops, no JVD. Gastrointestinal system: Abdomen is obese, nondistended, soft and nontender. No organomegaly or masses felt. Normal bowel sounds heard. Central nervous system: No focal neurological deficits. Extremities: No cyanosis or clubbing. Skin: No petechiae. Psychiatry: Judgement and insight appear normal. Mood & affect appropriate.    Condition at discharge: Stable and improved.  The results of significant diagnostics from this hospitalization (including imaging, microbiology, ancillary and laboratory) are listed below for reference.   Imaging Studies: ECHO TEE Result Date: 05/02/2024    TRANSESOPHOGEAL ECHO REPORT   Patient Name:   Maria Meza Date of Exam: 05/02/2024 Medical Rec #:  [id number]        Height:       65.0 in Accession #:    [id number]       Weight:       256.4 lb Date of Birth:  1956-11-19         BSA:          2.198 m Patient Age:    68 years         BP:           111/71 mmHg Patient Gender: F                HR:           146 bpm. Exam Location:  [facility] Procedure: Transesophageal Echo, Cardiac Doppler and Color Doppler (Both            Spectral and Color Flow Doppler were utilized during procedure). Indications:     Arrhythmia  History:         Patient has prior history of Echocardiogram examinations, most                  recent 09/14/2022. Arrythmias:Atrial Fibrillation.  Sonographer:     Astrid Blamer Referring Phys:  [id number] Dorma Gash Diagnosing Phys: Lucetta Russel Mallipeddi  PROCEDURE: After discussion of the risks and benefits of a TEE, an informed consent was obtained from the patient. The transesophogeal probe was passed without difficulty through the esophogus of the patient. Imaged were obtained with the patient in a left lateral decubitus position. Sedation performed by different physician. The patient was monitored while under deep sedation. Anesthestetic sedation was provided intravenously by Anesthesiology: 430mg  of Propofol , 60mg  of Lidocaine . Image quality was good. The patient's vital signs; including heart rate, blood pressure, and oxygen saturation; remained stable throughout the procedure. Supplementary images were obtained from transthoracic windows as indicated to answer the clinical question. The patient developed no complications during the procedure. A successful direct current cardioversion  was performed at 200 joules with 1 attempt.  IMPRESSIONS  1. Left ventricular ejection fraction, by estimation, is 60 to 65%. The left ventricle has normal function. The left ventricle has no regional wall motion abnormalities.  2. Right ventricular systolic function is normal. The right ventricular size is normal.  3. No left atrial/left atrial appendage thrombus was detected.  4. The mitral valve is normal in structure. Mild mitral valve regurgitation.  5. The aortic valve is tricuspid. Aortic valve regurgitation is not visualized. No aortic stenosis is present. Conclusion(s)/Recommendation(s): Normal biventricular function without evidence of hemodynamically significant valvular heart disease. No LA/LAA thrombus identified. Successful cardioversion performed with restoration of normal sinus rhythm. FINDINGS  Left Ventricle: Left ventricular ejection fraction, by estimation, is 60 to 65%. The left ventricle has normal function. The left ventricle has no regional wall motion abnormalities. The left ventricular internal cavity size was normal in size. There is  no left  ventricular hypertrophy. Right Ventricle: The right ventricular size is normal. No increase in right ventricular wall thickness. Right ventricular systolic function is normal. Left Atrium: Left atrial size was not assessed. No left atrial/left atrial appendage thrombus was detected. Right Atrium: Right atrial size was not assessed. Prominent Chiari network. Pericardium: There is no evidence of pericardial effusion. Mitral Valve: The mitral valve is normal in structure. Mild mitral valve regurgitation. Tricuspid Valve: The tricuspid valve is normal in structure. Tricuspid valve regurgitation is mild . No evidence of tricuspid stenosis. Aortic Valve: The aortic valve is tricuspid. Aortic valve regurgitation is not visualized. No aortic stenosis is present. Pulmonic Valve: The pulmonic valve was normal in structure. Pulmonic valve regurgitation is not visualized. Aorta: The aortic root is normal in size and structure. Venous: The inferior vena cava was not well visualized. IAS/Shunts: No atrial level shunt detected by color flow Doppler. Additional Comments: Spectral Doppler performed. Vishnu Priya Mallipeddi Electronically signed by Lucetta Russel Mallipeddi Signature Date/Time: 05/02/2024/3:22:22 PM    Final    DG Chest Port 1 View Result Date: 05/01/2024 CLINICAL DATA:  Palpitations.  History of atrial fibrillation. EXAM: PORTABLE CHEST 1 VIEW COMPARISON:  11/24/2022 FINDINGS: The cardiomediastinal contours are normal. The lungs are clear. Pulmonary vasculature is normal. No consolidation, pleural effusion, or pneumothorax. No acute osseous abnormalities are seen. IMPRESSION: No acute findings. Electronically Signed   By: Chadwick Colonel M.D.   On: 05/01/2024 12:21    Microbiology: Results for orders placed or performed during the hospital encounter of 05/01/24  MRSA Next Gen by PCR, Nasal     Status: None   Collection Time: 05/01/24  6:55 PM   Specimen: Nasal Mucosa; Nasal Swab  Result Value Ref Range Status    MRSA by PCR Next Gen NOT DETECTED NOT DETECTED Final    Comment: (NOTE) The GeneXpert MRSA Assay (FDA approved for NASAL specimens only), is one component of a comprehensive MRSA colonization surveillance program. It is not intended to diagnose MRSA infection nor to guide or monitor treatment for MRSA infections. Test performance is not FDA approved in patients less than 110 years old. Performed at [facility], [address], [place], [place] [address]     Labs: CBC: Recent Labs  Lab 05/01/24 1120  WBC 9.6  HGB 14.8  HCT 44.8  MCV 94.5  PLT 379   Basic Metabolic Panel: Recent Labs  Lab 04/29/24 0647 05/01/24 1120 05/02/24 0339  NA 141 138 142  K 4.1 3.8 4.0  CL 106 102 108  CO2 25 27 25  GLUCOSE 96 89 88  BUN 14 15 17   CREATININE 0.81 0.96 0.83  CALCIUM 9.5 9.8 9.0  MG  --  2.2 2.1  PHOS  --  3.0  --    Liver Function Tests: Recent Labs  Lab 05/02/24 0339  AST 20  ALT 30  ALKPHOS 62  BILITOT 0.7  PROT 6.1*  ALBUMIN 3.6   CBG: No results for input(s): "GLUCAP" in the last 168 hours.  Discharge time spent: greater than 30 minutes.  Signed: Justina Oman, MD [facility] 05/02/2024

## 2024-05-02 NOTE — Plan of Care (Signed)

## 2024-05-02 NOTE — Consult Note (Signed)
 CARDIOLOGY CONSULT NOTE    Patient ID: Maria Meza; [id number]; 1956/11/12   Admit date: 05/01/2024 Date of Consult: 05/02/2024  Primary Care Provider: Omie Bickers, MD Primary Cardiologist:  Primary Electrophysiologist:     History of Present Illness:   Maria Meza is a 68 year old F known to have paroxysmal A-fib s/p multiple DCCV (last DCCV in January 2025), OSA on CPAP, history of TIA presented to the ER with sudden onset of palpitations.  Associated with SOB.  No other symptoms of chest pain, syncope or leg swelling.  She underwent uterine fibroid/polyp removal a few days ago and had to hold her Eliquis  for 2 days prior to the procedure.  Currently on diltiazem  7.5 mg/h and cannot uptitrate her dose due to soft blood pressures.  Continue Eliquis  5 mg twice daily.  Past Medical History:  Diagnosis Date   Absolute anemia    No longer on iron supplementation.   Adult celiac disease 2018   Anxiety    Arrhythmia 06/2016   Event monitor has shown PVCs, PSVT at 1 short burst of NSVT.;  Event monitor from March 2021: 6 short runs of wide-complex tachycardia and 25 runs of PSVT--intolerant of high-dose beta-blocker because of bradycardia.   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  (ANA positive, dsDNA negative)   Dysmenorrhea    Dysrhythmia    Elevated LFTs    Fatty liver    GERD (gastroesophageal reflux disease)    Glaucoma    History of COVID-19 02/19/2020   Hypothyroidism    IBS (irritable bowel syndrome)    Morbid obesity with BMI of 40.0-44.9, adult (HCC) 09/2020   BMI 43.07   MRSA infection 4-end-21   on abdomen   OSA on CPAP    Pulmonary hypertension (HCC)    PVC's (premature ventricular contractions)    Thyroid disease     hypothyroidism   TIA (transient ischemic attack)    2 per patient   Urinary incontinence    with sneezing, coughing    Past Surgical History:  Procedure Laterality Date   BIOPSY  11/01/2023   Procedure: BIOPSY;  Surgeon: Suzette Espy, MD;  Location: [facility];  Service: Endoscopy;;   CARDIAC EVENT MONITOR  06/2016   [facility]) Noted PVCs, PSVT and NSVT (1 episode of 20 beats). ->  PT evaluation suggested PVCs appear to have been completely interpolated.  Felt to be septal   CARDIAC MRI -ADENOSINE / STRESS  05/28/2020   [facility]): EF 69%. Normal LV size, thickness and function w/ NO RWMA  CO & CI 6.5 L/min & 3 L/min.      Normal RV size thickness and function.  No RV WMA or aneurysm.  No findings c/w  ARVC.  Both atria are mildly enlarged.  Trileaflet aortic valve with no evidence of stenosis.  Adenosine Stress Imaging: no  inducible myocardial ischemia OR prior infarction/scar or infiltrate. Normal AoV. Mid TR.   CARDIOVERSION N/A 09/14/2022   Procedure: CARDIOVERSION;  Surgeon: Hugh Madura, MD;  Location: [facility];  Service: Cardiovascular;  Laterality: N/A;   CATARACT EXTRACTION Right 03/2018   CATARACT EXTRACTION Left 03/2018   CHOLECYSTECTOMY     ESOPHAGOGASTRODUODENOSCOPY (EGD) WITH PROPOFOL  N/A 11/01/2023   Procedure: ESOPHAGOGASTRODUODENOSCOPY (  EGD) WITH PROPOFOL ;  Surgeon: Suzette Espy, MD;  Location: [facility];  Service: Endoscopy;  Laterality: N/A;  1245PM, ASA 3   HYSTEROSCOPY WITH D & C N/A 04/29/2024   Procedure: DILATATION AND CURETTAGE Larri Ply;  Surgeon: Romaine Closs, MD;  Location: [facility];  Service: Gynecology;  Laterality: N/A;  Myosure   LAPAROSCOPIC TUBAL LIGATION  1986   NUCLEAR STRESS TEST  05/2014   EF 55%.  Mixed anteroseptal defect consistent with breast attenuation artifact.   TEE WITHOUT CARDIOVERSION N/A 09/14/2022   Procedure: TRANSESOPHAGEAL ECHOCARDIOGRAM (TEE);  Surgeon: Hugh Madura, MD;  Location: [facility];  Service: Cardiovascular;   Laterality: N/A;   TRANSTHORACIC ECHOCARDIOGRAM  08/2016   [facility]- Normal LV size and function-EF~55%.  No RWMA..  Normal LA pressures.  Normal RV function.  Trivial AR, PR and TR.  No stenoses.    TRANSTHORACIC ECHOCARDIOGRAM  02/14/2022   Difficult images.  Normal EF 60 to 65%.  No RWMA.  Normal diastolic parameters.  Normal RV size and function.  Mild to moderate LA dilation.  Normal aortic and mitral valves.  Acing aorta measured roughly 38 mm.   ZIO PATCH CARDIAC EVENT MONITOR  02/2020   Predom Rhytym: SR - min 46 - max 113 bpm, avg 64 bpm. Rare isolated PVC & PACs. 6 short NSVT runs (8-11 beats, 134-245 bpm); 25 runs of  PAT/SVT - fastest 9 beats (200 bpm), longest 10.6 sec (130 bpm). Noted Sx w/ PAT & NSVT during daylight hours, and only once at midnight.   ZIO PATCH MONITOR  01/2022   Predominant SR: Rate range 46-99 bpm, avg 61 bpm.  Rare PACs & PVCs (& rare couplets).  No bigeminy/trigeminy.  (Sx noted w/ PACs and PVCs - & a few Atrial Runs).  35 Atrial Runs + 1 "V. tach "run of 4 Narrow Complex beats; fastest atrial run 5 beats @ 193 bpm & longest 22 beats (12.1 sec) @ avg 108 bpm.       Inpatient Medications: Scheduled Meds:  apixaban   5 mg Oral BID   Chlorhexidine  Gluconate Cloth  6 each Topical Daily   folic acid   2 mg Oral Daily   latanoprost   1 drop Both Eyes Daily   levothyroxine   137 mcg Oral Daily   pantoprazole   40 mg Oral Daily   sodium chloride flush  3 mL Intravenous Q12H   Continuous Infusions:  sodium chloride     diltiazem  (CARDIZEM ) infusion 7.5 mg/hr (05/02/24 0326)   PRN Meds: sodium chloride, acetaminophen  **OR** acetaminophen , diphenhydrAMINE-zinc acetate, prochlorperazine, sodium chloride flush  Allergies:    Allergies  Allergen Reactions   Amiodarone Other (See Comments)    "does not tolerate"--slows heart rate   Latex Other (See Comments)    blisters   Propafenone     Other Reaction(s): Other (See Comments)  Heart felt like it was going to stop;  med did not work well.    Caused very low HR   Gluten Meal     Pt has celiac disease.    Hydrocodone Nausea And Vomiting   Propafenone Hcl     Caused very low HR   Verapamil      Fatigue, constipation    Social History:   Social History   Socioeconomic History   Marital status: Married    Spouse name: Not on file   Number of children: Not on file   Years of education: Not on file   Highest education level: Not on  file  Occupational History   Not on file  Tobacco Use   Smoking status: Never   Smokeless tobacco: Never   Tobacco comments:    Never smoke 11/28/22  Vaping Use   Vaping status: Never Used  Substance and Sexual Activity   Alcohol use: No    Alcohol/week: 0.0 standard drinks of alcohol   Drug use: No   Sexual activity: Not Currently    Partners: Male    Birth control/protection: Post-menopausal    Comment: less than 5, after IC, no hx of STD, no abnormal pap, no DES  Other Topics Concern   Not on file  Social History Narrative   She lives in [place], [place]   Right handed    Caffeine use: 1 cup coffee every morning   Social Drivers of Health   Financial Resource Strain: Not on file  Food Insecurity: No Food Insecurity (05/01/2024)   Hunger Vital Sign    Worried About Running Out of Food in the Last Year: Never true    Ran Out of Food in the Last Year: Never true  Transportation Needs: No Transportation Needs (05/01/2024)   PRAPARE - Administrator, Civil Service (Medical): No    Lack of Transportation (Non-Medical): No  Physical Activity: Not on file  Stress: Not on file  Social Connections: Socially Integrated (05/01/2024)   Social Connection and Isolation Panel [NHANES]    Frequency of Communication with Friends and Family: More than three times a week    Frequency of Social Gatherings with Friends and Family: More than three times a week    Attends Religious Services: More than 4 times per year    Active Member of [employer] or Organizations:  Yes    Attends Engineer, structural: More than 4 times per year    Marital Status: Married  Catering manager Violence: Not At Risk (05/01/2024)   Humiliation, Afraid, Rape, and Kick questionnaire    Fear of Current or Ex-Partner: No    Emotionally Abused: No    Physically Abused: No    Sexually Abused: No    Family History:    Family History  Problem Relation Age of Onset   Heart attack Father        dec age 76   Hypertension Father    Hypertension Mother    Hyperlipidemia Mother    Migraines Mother    Seizures Mother        epilepsy   Stroke Mother        TIA's   Thyroid disease Sister        hypothyroid   Thyroid disease Sister        hypothyroid   Rheum arthritis Maternal Grandmother    Migraines Maternal Grandmother    Cancer Maternal Grandfather 3       colon ca--DEC age 19   Diabetes Maternal Grandfather    Stroke Paternal Grandmother        multiple   Thyroid disease Paternal Grandmother        goiter-hypothyroid   Breast cancer Maternal Aunt      ROS:  Please see the history of present illness.  ROS  All other ROS reviewed and negative.     Physical Exam/Data:   Vitals:   05/02/24 0630 05/02/24 0645 05/02/24 0700 05/02/24 0800  BP: (!) 125/48 116/60 (!) 118/49 118/66  Pulse: 89 77 77 85  Resp: 20 19 (!) 21 (!) 25  Temp:    97.9 F (  36.6 C)  TempSrc:    Oral  SpO2: 95% 92% 94% 97%  Weight:      Height:        Intake/Output Summary (Last 24 hours) at 05/02/2024 0934 Last data filed at 05/02/2024 0326 Gross per 24 hour  Intake 74.54 ml  Output --  Net 74.54 ml   Filed Weights   05/01/24 1108 05/01/24 1545  Weight: 117.9 kg 116.3 kg   Body mass index is 42.67 kg/m.  General:  Well nourished, well developed, in no acute distress HEENT: normal Lymph: no adenopathy Neck: no JVD Endocrine:  No thryomegaly Vascular: No carotid bruits; FA pulses 2+ bilaterally without bruits  Cardiac:  normal S1, S2; RRR; no murmur  Lungs:  clear to  auscultation bilaterally, no wheezing, rhonchi or rales  Abd: soft, nontender, no hepatomegaly  Ext: no edema Musculoskeletal:  No deformities, BUE and BLE strength normal and equal Skin: warm and dry  Neuro:  CNs 2-12 intact, no focal abnormalities noted Psych:  Normal affect   EKG:  The EKG was personally reviewed and demonstrates:   Telemetry:  Telemetry was personally reviewed and demonstrates:    Relevant CV Studies:   Laboratory Data:  Chemistry Recent Labs  Lab 04/29/24 0647 05/01/24 1120 05/02/24 0339  NA 141 138 142  K 4.1 3.8 4.0  CL 106 102 108  CO2 25 27 25   GLUCOSE 96 89 88  BUN 14 15 17   CREATININE 0.81 0.96 0.83  CALCIUM 9.5 9.8 9.0  GFRNONAA >60 >60 >60  ANIONGAP 10 9 9     Recent Labs  Lab 05/02/24 0339  PROT 6.1*  ALBUMIN 3.6  AST 20  ALT 30  ALKPHOS 62  BILITOT 0.7   Hematology Recent Labs  Lab 05/01/24 1120  WBC 9.6  RBC 4.74  HGB 14.8  HCT 44.8  MCV 94.5  MCH 31.2  MCHC 33.0  RDW 13.5  PLT 379   Cardiac EnzymesNo results for input(s): "TROPONINI" in the last 168 hours. No results for input(s): "TROPIPOC" in the last 168 hours.  BNPNo results for input(s): "BNP", "PROBNP" in the last 168 hours.  DDimer No results for input(s): "DDIMER" in the last 168 hours.  Radiology/Studies:  DG Chest Port 1 View Result Date: 05/01/2024 CLINICAL DATA:  Palpitations.  History of atrial fibrillation. EXAM: PORTABLE CHEST 1 VIEW COMPARISON:  11/24/2022 FINDINGS: The cardiomediastinal contours are normal. The lungs are clear. Pulmonary vasculature is normal. No consolidation, pleural effusion, or pneumothorax. No acute osseous abnormalities are seen. IMPRESSION: No acute findings. Electronically Signed   By: Chadwick Colonel M.D.   On: 05/01/2024 12:21    Assessment and Plan:   Atrial fibrillation with RVR : Presented with sudden onset of palpitations for 1 day, associated with SOB.  EKG showed A-fib with RVR.  Last DCCV in January 2025.  She has a  strong family history of A-fib and subsequent ablation in her brother, daughter and others as well.  Continue diltiazem  drip 7.5 mg/h, unable to uptitrate due to soft BP.  Continue Eliquis  5 mg daily (she had to interrupt Eliquis  a few days ago for uterine polyp removal).  N.p.o. today.  Will check with anesthesia to see if they have any spot open today for TEE-guided DCCV.  Patient agreeable to the plan.  She tried amiodarone and flecainide (according to the patient) and did not tolerate very well.  Due to strong family history of A-fib and PVI (in her brother, daughter and other family  members), she would strongly benefit from PVI.  But her BMI is 42, she will need to follow-up with her cardiologist or sleep medicine to see if she will benefit from Zepbound.  Will make sure she has a follow-up appointment with A-fib clinic upon discharge to discuss about PVI.  Informed consent for TEE guided DCCV Risks and benefits of the procedure are explained to the patient.  Risk include, but not limited to, sore throat, infection, pneumonia, esophageal rupture, cardiac rest, possibility of PPM implantation.  Patient comprehended these risks and agreed to proceed with the procedure.  OSA on CPAP: Continue CPAP nightly.  Compliant.  Congratulated.  History of TIA: Not on aspirin due to Eliquis  use, outpatient follow-up with PCP.   For questions or updates, please contact [facility] Please consult [email or web address] for contact info under Cardiology/STEMI.   Signed, Kelby Adell Priya Alhaji Mcneal, MD 05/02/2024 9:34 AM

## 2024-05-03 ENCOUNTER — Encounter (HOSPITAL_COMMUNITY): Payer: Self-pay | Admitting: Internal Medicine

## 2024-05-03 ENCOUNTER — Other Ambulatory Visit: Payer: Self-pay

## 2024-05-03 ENCOUNTER — Encounter: Payer: Self-pay | Admitting: Cardiology

## 2024-05-03 DIAGNOSIS — K760 Fatty (change of) liver, not elsewhere classified: Secondary | ICD-10-CM

## 2024-05-03 DIAGNOSIS — R7989 Other specified abnormal findings of blood chemistry: Secondary | ICD-10-CM

## 2024-05-03 NOTE — Telephone Encounter (Signed)
 Called patient to discuss bleeding symptoms. Discharged on 05/02/24 from hospital. 05/01/2024 hemoglobin 14.8. Patient currently changing pad 3 times a day, ~three-quarter soaked.  Not passing any large clots. Feeling well after discharge.  Discussed that restarting Eliquis  on postop day 1 is the most likely cause for increased postoperative bleeding.  This will likely continue for 7 days following surgery.  I would anticipate that her bleeding should start to taper 1 week postop.   Okay to continue Eliquis . Patient to call if she saturated a pad every 2 hours over the weekend or before her next postoperative appointment. All questions answered.

## 2024-05-04 NOTE — Telephone Encounter (Signed)
 Thanks for the update.  Hopefully the A-fib clinic follow up will show that she is back in sinus rhythm.  Maybe the A-fib is related to her surgery.

## 2024-05-06 ENCOUNTER — Ambulatory Visit (HOSPITAL_COMMUNITY): Admission: RE | Admit: 2024-05-06 | Source: Ambulatory Visit

## 2024-05-07 NOTE — Telephone Encounter (Signed)
 Free T4 is a little elevated.  Probably your Synthroid  dose needs to be adjusted.  Would defer to your endocrinologist or PCP (whoever controls it.).  It would be nice to see with T4 levels stabilized -> TSH was normal and free T4 was a little bit above normal.  Not usually a level that we would consider to be overly dramatic but could be causing issues.  I know you do not necessarily want to do ablation but honestly since your thyroid levels tend to be somewhat erratic, I think getting rid of A-fib is a concern with ablation would potentially be a good option.  Randene Bustard, MD

## 2024-05-10 ENCOUNTER — Encounter: Payer: Self-pay | Admitting: Cardiology

## 2024-05-10 ENCOUNTER — Ambulatory Visit (HOSPITAL_COMMUNITY)
Admission: RE | Admit: 2024-05-10 | Discharge: 2024-05-10 | Disposition: A | Discharge status: Home / Self Care | Source: Ambulatory Visit | Attending: Physician Assistant | Admitting: Physician Assistant

## 2024-05-10 VITALS — BP 140/72 | HR 47 | Ht 65.0 in | Wt 257.2 lb

## 2024-05-10 DIAGNOSIS — E66813 Obesity, class 3: Secondary | ICD-10-CM | POA: Insufficient documentation

## 2024-05-10 DIAGNOSIS — D6869 Other thrombophilia: Secondary | ICD-10-CM | POA: Insufficient documentation

## 2024-05-10 DIAGNOSIS — Z6841 Body Mass Index (BMI) 40.0 and over, adult: Secondary | ICD-10-CM | POA: Insufficient documentation

## 2024-05-10 DIAGNOSIS — I4819 Other persistent atrial fibrillation: Secondary | ICD-10-CM | POA: Insufficient documentation

## 2024-05-10 MED ORDER — METOPROLOL TARTRATE 25 MG PO TABS: 25.0000 mg | ORAL_TABLET | Freq: Two times a day (BID) | ORAL | 3 refills | Status: DC

## 2024-05-10 NOTE — Anesthesia Postprocedure Evaluation (Signed)
 Anesthesia Post Note  Patient: Maria Meza  Procedure(s) Performed: CARDIOVERSION ECHOCARDIOGRAM, TRANSESOPHAGEAL  Patient location during evaluation: Phase II Anesthesia Type: General Level of consciousness: awake Pain management: pain level controlled Vital Signs Assessment: post-procedure vital signs reviewed and stable Respiratory status: spontaneous breathing and respiratory function stable Cardiovascular status: blood pressure returned to baseline and stable Postop Assessment: no headache and no apparent nausea or vomiting Anesthetic complications: no Comments: Late entry   No notable events documented.   Last Vitals:  Vitals:   05/02/24 1524 05/02/24 1530  BP: (!) 119/35 (!) 111/44  Pulse: 69 73  Resp: 11 18  Temp:    SpO2: 97% 96%    Last Pain:  Vitals:   05/02/24 1455  TempSrc:   PainSc: 0-No pain                 Coretha Dew

## 2024-05-10 NOTE — Progress Notes (Signed)
 Primary Care Physician: Omie Bickers, MD Primary Cardiologist: Dr Addie Holstein Primary Electrophysiologist: Dr Arlester Ladd  Referring Physician: Cristine Done ED   Maria Meza is a 68 y.o. female with a history of OSA, SVT, TIA, hypothyroidism, atrial fibrillation who presents for follow up in the [facility].  She has a remote history of SVT and PVCs and had an EP study many years ago, no ablation. The patient was initially diagnosed with atrial fibrillation 08/2022 after presenting to the ED with symptoms of palpitations, lightheadedness, and mild dyspnea. She was started on Eliquis  for a CHADS2VASC score of 4 and underwent TEE guided DCCV. She was again seen at the ED 10/30/22 with palpitations and underwent repeat DCCV. She denies significant alcohol use and is compliant with her CPAP.   Patient did see Dr Arlester Ladd 11/2022 and was scheduled for an ablation. However, she started having more palpitations and heat intolerance. Her thyroid levels were checked and she was hyperthyroid. Her levothyroxine  dose was reduced. Her ablation was cancelled while her thyroid medication was being adjusted.   She was seen at the ED 05/01/24 with sudden onset palpitations. She underwent TEE guided DCCV on 05/02/24. Her BB was also increased.   Patient returns for follow up for atrial fibrillation. She remains in SR today. She has noticed a little more fatigue on the higher dose of BB. She had a workup with her PCP earlier this week with a thyroid panel which was unremarkable. No bleeding issues on anticoagulation.   Today, she  denies symptoms of palpitations, chest pain, shortness of breath, orthopnea, PND, lower extremity edema, dizziness, presyncope, syncope, bleeding, or neurologic sequela. The patient is tolerating medications without difficulties and is otherwise without complaint today.    Atrial Fibrillation Risk Factors:  she does have symptoms or diagnosis of sleep apnea. she does  not have a history of rheumatic fever. she does not have a history of alcohol use.   Atrial Fibrillation Management history:  Previous antiarrhythmic drugs: amiodarone, propafenone  Previous cardioversions: 09/14/22, 10/30/22, 01/17/24, 05/02/24 Previous ablations: none Anticoagulation history: Eliquis    Past Medical History:  Diagnosis Date   Absolute anemia    No longer on iron supplementation.   Adult celiac disease 2018   Anxiety    Arrhythmia 06/2016   Event monitor has shown PVCs, PSVT at 1 short burst of NSVT.;  Event monitor from March 2021: 6 short runs of wide-complex tachycardia and 25 runs of PSVT--intolerant of high-dose beta-blocker because of bradycardia.   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  (ANA positive, dsDNA negative)   Dysmenorrhea    Dysrhythmia    Elevated LFTs    Fatty liver    GERD (gastroesophageal reflux disease)    Glaucoma    History of COVID-19 02/19/2020   Hypothyroidism    IBS (irritable bowel syndrome)    Morbid obesity with BMI of 40.0-44.9, adult (HCC) 09/2020   BMI 43.07   MRSA infection 4-end-21   on abdomen   OSA on CPAP    Pulmonary hypertension (HCC)    PVC's (premature ventricular contractions)    Thyroid disease    hypothyroidism   TIA (transient ischemic attack)    2 per patient   Urinary  incontinence    with sneezing, coughing    Current Outpatient Medications  Medication Sig Dispense Refill   apixaban  (ELIQUIS ) 5 MG TABS tablet Take 1 tablet (5 mg total) by mouth 2 (two) times daily.     Ascorbic Acid  (VITAMIN C ) 1000 MG tablet Take 1,000 mg by mouth daily.     b complex vitamins tablet Take by mouth daily.     cholecalciferol (VITAMIN D3) 25 MCG (1000 UNIT) tablet Take 400 mcg by  mouth daily.     folic acid  (FOLVITE ) 1 MG tablet Take 2 mg by mouth daily. 2 tablets daily     latanoprost  (XALATAN ) 0.005 % ophthalmic solution Place 1 drop into both eyes daily.      levothyroxine  (SYNTHROID ) 137 MCG tablet Take 137 mcg by mouth daily.     magnesium  oxide (MAG-OX) 400 MG tablet TAKE 1 TABLET BY MOUTH TWICE A DAY 180 tablet 3   metoprolol  tartrate (LOPRESSOR ) 25 MG tablet Take 1.5 tablets (37.5 mg total) by mouth 2 (two) times daily. 180 tablet 3   pantoprazole  (PROTONIX ) 40 MG tablet Take 1 tablet (40 mg total) by mouth daily. 30 tablet 11   No current facility-administered medications for this encounter.    ROS- All systems are reviewed and negative except as per the HPI above.  Physical Exam: Vitals:   05/10/24 1037  BP: (!) 140/72  Pulse: (!) 47  Weight: 116.7 kg  Height: 5\' 5"  (1.651 m)    GEN: Well nourished, well developed in no acute distress CARDIAC: Regular rate and rhythm, bradycardia, no murmurs, rubs, gallops RESPIRATORY:  Clear to auscultation without rales, wheezing or rhonchi  ABDOMEN: Soft, non-tender, non-distended EXTREMITIES:  No edema; No deformity    Wt Readings from Last 3 Encounters:  05/10/24 116.7 kg  05/02/24 116.3 kg  04/29/24 117.9 kg    EKG today demonstrates  SB, RBBB Vent. rate 47 BPM PR interval 146 ms QRS duration 126 ms QT/QTcB 434/384 ms   Echo 02/14/22 demonstrated   1. Difficult acoustic windows. Difficult to see endocardium . Left  ventricular ejection fraction, by estimation, is 60 to 65%. The left  ventricle has normal function. Left ventricular diastolic parameters were  normal.   2. Right ventricular systolic function is normal. The right ventricular  size is normal.   3. Left atrial size was mild to moderately dilated.   4. The mitral valve is normal in structure. Trivial mitral valve  regurgitation.   5. The aortic valve is tricuspid. Aortic valve regurgitation is not  visualized.   6. Aortic  dilatation noted. There is borderline dilatation of the  ascending aorta, measuring 38 mm.   Epic records are reviewed at length today  CHA2DS2-VASc Score = 6  The patient's score is based upon: CHF History: 0 HTN History: 1 Diabetes History: 0 Stroke History: 2 Vascular Disease History: 1 Age Score: 1 Gender Score: 1       ASSESSMENT AND PLAN: Persistent Atrial Fibrillation (ICD10:  I48.19) The patient's CHA2DS2-VASc score is 6, indicating a 9.7% annual risk of stroke.   Previously failed propafenone and amiodarone due to bradycardia She is in SR today but has had two DCCV's this year. Thyroid panel with PCP within normal limits. We discussed rhythm control options including AAD (dofetilide) and ablation. She would like to be referred back to Dr Arlester Ladd to discuss afib ablation. We did discuss that ablations are more likely to be successful with BMI < 40. She previously lost 30+  lbs by walking regularly and plans to start walking again.  Continue Eliquis  5 mg BID Decrease Lopressor   back to 25 mg BID with bradycardia and fatigue.   Secondary Hypercoagulable State (ICD10:  D68.69) The patient is at significant risk for stroke/thromboembolism based upon her CHA2DS2-VASc Score of 6.  Continue Apixaban  (Eliquis ).   Obesity Body mass index is 42.8 kg/m.  Encouraged lifestyle modification. We specifically discussed walking regularly and eating a Mediterranean style diet.   OSA  Encouraged nightly CPAP   Follow up with Dr Arlester Ladd to discuss afib ablation.    Myrtha Ates PA-C [facility] [address] [place], [place] [address] [phone] 05/10/2024 11:13 AM

## 2024-05-13 ENCOUNTER — Telehealth: Payer: Self-pay | Admitting: Cardiovascular Disease

## 2024-05-13 ENCOUNTER — Encounter: Payer: Self-pay | Admitting: Obstetrics and Gynecology

## 2024-05-13 ENCOUNTER — Ambulatory Visit: Admitting: Obstetrics and Gynecology

## 2024-05-13 VITALS — BP 132/72 | HR 55 | Temp 98.0°F | Ht 65.5 in | Wt 259.2 lb

## 2024-05-13 DIAGNOSIS — Z09 Encounter for follow-up examination after completed treatment for conditions other than malignant neoplasm: Secondary | ICD-10-CM

## 2024-05-13 NOTE — Telephone Encounter (Signed)
 Patient cancelled 6/02 appointment with Dr. Arlester Ladd because she is unsure whether she wants to proceed with ablation right now. She declined rescheduling at this time and says she will call back if she would like to reschedule--just FYI.

## 2024-05-13 NOTE — Telephone Encounter (Signed)
 Will send this message as an FYI to Dr. Katheryne Pane covering RN and EP Procedure Scheduler.

## 2024-05-13 NOTE — Progress Notes (Signed)
 68 y.o. [id number] female with Afib (metoprolol , eliquis , recent cardioversion 01/16/24), prior TIA, OSA, GERD, morbid obesity, postmenopausal bleeding and thickened endometrium/mass here for postop exam for diagnostic hysteroscopy, dilation and curettage, polypectomy. Patient of Dr. Colvin Dec. Married.  Patient's last menstrual period was 04/16/2016 (exact date).  She reports everything seems to be going well. Denies pain or issues today.  Denies N/V, abdominal pain, VB, dysuria. Normal BM and voids.   +chronic palpations. Had resent episode of atrial fibrillation and had cardioversion on January 16, 2024. She had another episode of Afib requiring cardioversion on 05/01/24. Doing well now.  04/29/24 pathology: A. ENDOMETRIUM, POLYPECTOMY:  -  Polypoid fragments of disordered proliferative endometrium with separate fragments of smooth muscle with adenomyosis and areas of world smooth muscle suggestive of a submucosal leiomyoma.  -  Negative for atypia/hyperplasia.   B. ENDOMETRIUM, CURETTAGE:  -  Predominantly crushed endometrial stroma and strips of endometrium and blood.    Last PAP:    Component Value Date/Time   DIAGPAP  10/16/2023 0837    - Negative for intraepithelial lesion or malignancy (NILM)   DIAGPAP  06/25/2021 1629    - Negative for intraepithelial lesion or malignancy (NILM)   DIAGPAP  06/04/2019 0000    NEGATIVE FOR INTRAEPITHELIAL LESIONS OR MALIGNANCY.   HPVHIGH Negative 10/16/2023 0837   ADEQPAP  10/16/2023 0837    Satisfactory for evaluation; transformation zone component PRESENT.   ADEQPAP  06/25/2021 1629    Satisfactory for evaluation; transformation zone component PRESENT.   ADEQPAP  06/04/2019 0000    Satisfactory for evaluation  endocervical/transformation zone component PRESENT.   GYN HISTORY: No significant history  OB History  Gravida Para Term Preterm AB Living  2 2 2   2   SAB IAB Ectopic Multiple Live Births          # Outcome Date GA Lbr Len/2nd  Weight Sex Type Anes PTL Lv  2 Term           1 Term             Past Medical History:  Diagnosis Date   Absolute anemia    No longer on iron supplementation.   Adult celiac disease 2018   Anxiety    Arrhythmia 06/2016   Event monitor has shown PVCs, PSVT at 1 short burst of NSVT.;  Event monitor from March 2021: 6 short runs of wide-complex tachycardia and 25 runs of PSVT--intolerant of high-dose beta-blocker because of bradycardia.   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  (ANA positive, dsDNA negative)   Dysmenorrhea    Dysrhythmia    Elevated LFTs    Fatty liver    GERD (gastroesophageal reflux disease)    Glaucoma    History of COVID-19 02/19/2020   Hypothyroidism    IBS (irritable bowel syndrome)    Morbid obesity with BMI of 40.0-44.9, adult (HCC) 09/2020   BMI 43.07   MRSA infection 4-end-21   on abdomen   OSA on CPAP    Pulmonary hypertension (HCC)    PVC's (premature ventricular contractions)    Thyroid  disease    hypothyroidism   TIA (transient ischemic attack)    2 per patient   Urinary incontinence    with sneezing, coughing  Past Surgical History:  Procedure Laterality Date   BIOPSY  11/01/2023   Procedure: BIOPSY;  Surgeon: Suzette Espy, MD;  Location: [facility];  Service: Endoscopy;;   CARDIAC EVENT MONITOR  06/2016   [facility]) Noted PVCs, PSVT and NSVT (1 episode of 20 beats). ->  PT evaluation suggested PVCs appear to have been completely interpolated.  Felt to be septal   CARDIAC MRI -ADENOSINE / STRESS  05/28/2020   [facility]): EF 69%. Normal LV size, thickness and function w/ NO RWMA  CO & CI 6.5 L/min & 3 L/min.      Normal RV size thickness and function.  No RV WMA or aneurysm.  No findings c/w  ARVC.   Both atria are mildly enlarged.  Trileaflet aortic valve with no evidence of stenosis.  Adenosine Stress Imaging: no  inducible myocardial ischemia OR prior infarction/scar or infiltrate. Normal AoV. Mid TR.   CARDIOVERSION N/A 09/14/2022   Procedure: CARDIOVERSION;  Surgeon: Hugh Madura, MD;  Location: [facility] OR[facility];  Service: Cardiovascular;  Laterality: N/A;   CARDIOVERSION N/A 05/02/2024   Procedure: CARDIOVERSION;  Surgeon: Lasalle Pointer, MD;  Location: [facility];  Service: Cardiovascular;  Laterality: N/A;   CATARACT EXTRACTION Right 03/2018   CATARACT EXTRACTION Left 03/2018   CHOLECYSTECTOMY     ESOPHAGOGASTRODUODENOSCOPY (EGD) WITH PROPOFOL  N/A 11/01/2023   Procedure: ESOPHAGOGASTRODUODENOSCOPY (EGD) WITH PROPOFOL ;  Surgeon: Suzette Espy, MD;  Location: [facility];  Service: Endoscopy;  Laterality: N/A;  1245PM, ASA 3   HYSTEROSCOPY WITH D & C N/A 04/29/2024   Procedure: DILATATION AND CURETTAGE Larri Ply;  Surgeon: Romaine Closs, MD;  Location: [facility] OR;  Service: Gynecology;  Laterality: N/A;  Myosure   LAPAROSCOPIC TUBAL LIGATION  1986   NUCLEAR STRESS TEST  05/2014   EF 55%.  Mixed anteroseptal defect consistent with breast attenuation artifact.   TEE WITHOUT CARDIOVERSION N/A 09/14/2022   Procedure: TRANSESOPHAGEAL ECHOCARDIOGRAM (TEE);  Surgeon: Hugh Madura, MD;  Location: [facility];  Service: Cardiovascular;  Laterality: N/A;   TEE WITHOUT CARDIOVERSION N/A 05/02/2024   Procedure: ECHOCARDIOGRAM, TRANSESOPHAGEAL;  Surgeon: Lasalle Pointer, MD;  Location: [facility];  Service: Cardiovascular;  Laterality: N/A;   TRANSTHORACIC ECHOCARDIOGRAM  08/2016   [facility]- Normal LV size and function-EF~55%.  No RWMA..  Normal LA pressures.  Normal RV function.  Trivial AR, PR and TR.  No stenoses.    TRANSTHORACIC ECHOCARDIOGRAM  02/14/2022   Difficult images.  Normal EF 60 to 65%.  No RWMA.  Normal diastolic parameters.  Normal RV size and function.  Mild to moderate LA dilation.   Normal aortic and mitral valves.  Acing aorta measured roughly 38 mm.   ZIO PATCH CARDIAC EVENT MONITOR  02/2020   Predom Rhytym: SR - min 46 - max 113 bpm, avg 64 bpm. Rare isolated PVC & PACs. 6 short NSVT runs (8-11 beats, 134-245 bpm); 25 runs of  PAT/SVT - fastest 9 beats (200 bpm), longest 10.6 sec (130 bpm). Noted Sx w/ PAT & NSVT during daylight hours, and only once at midnight.   ZIO PATCH MONITOR  01/2022   Predominant SR: Rate range 46-99 bpm, avg 61 bpm.  Rare PACs & PVCs (& rare couplets).  No bigeminy/trigeminy.  (Sx noted w/ PACs and PVCs - & a few Atrial Runs).  35 Atrial Runs + 1 "V. tach "run of 4 Narrow Complex beats; fastest atrial run 5 beats @ 193 bpm & longest 22 beats (12.1 sec) @  avg 108 bpm.    Current Outpatient Medications on File Prior to Visit  Medication Sig Dispense Refill   apixaban  (ELIQUIS ) 5 MG TABS tablet Take 1 tablet (5 mg total) by mouth 2 (two) times daily.     Ascorbic Acid  (VITAMIN C ) 1000 MG tablet Take 1,000 mg by mouth daily.     b complex vitamins tablet Take by mouth daily.     cholecalciferol (VITAMIN D3) 25 MCG (1000 UNIT) tablet Take 400 mcg by mouth daily.     folic acid  (FOLVITE ) 1 MG tablet Take 2 mg by mouth daily. 2 tablets daily     latanoprost  (XALATAN ) 0.005 % ophthalmic solution Place 1 drop into both eyes daily.      levothyroxine  (SYNTHROID ) 137 MCG tablet Take 137 mcg by mouth daily.     magnesium  oxide (MAG-OX) 400 MG tablet TAKE 1 TABLET BY MOUTH TWICE A DAY 180 tablet 3   metoprolol  tartrate (LOPRESSOR ) 25 MG tablet Take 1 tablet (25 mg total) by mouth 2 (two) times daily. 180 tablet 3   pantoprazole  (PROTONIX ) 40 MG tablet Take 1 tablet (40 mg total) by mouth daily. 30 tablet 11   No current facility-administered medications on file prior to visit.    Allergies  Allergen Reactions   Amiodarone Other (See Comments)    "does not tolerate"--slows heart rate   Latex Other (See Comments)    blisters   Propafenone     Other  Reaction(s): Other (See Comments)  Heart felt like it was going to stop; med did not work well.    Caused very low HR   Gluten Meal     Pt has celiac disease.    Hydrocodone Nausea And Vomiting   Propafenone Hcl     Caused very low HR   Verapamil      Fatigue, constipation      PE Today's Vitals   05/13/24 1551  BP: 132/72  Pulse: (!) 55  Temp: 98 F (36.7 C)  TempSrc: Oral  SpO2: 98%  Weight: 259 lb 3.2 oz (117.6 kg)  Height: 5' 5.5" (1.664 m)   Body mass index is 42.48 kg/m.  Physical Exam Vitals reviewed.  Constitutional:      General: She is not in acute distress.    Appearance: Normal appearance.  HENT:     Head: Normocephalic and atraumatic.     Nose: Nose normal.  Eyes:     Extraocular Movements: Extraocular movements intact.     Conjunctiva/sclera: Conjunctivae normal.  Pulmonary:     Effort: Pulmonary effort is normal.  Abdominal:     Palpations: Abdomen is soft.     Tenderness: There is no abdominal tenderness.  Musculoskeletal:        General: Normal range of motion.     Cervical back: Normal range of motion.  Neurological:     General: No focal deficit present.     Mental Status: She is alert.  Psychiatric:        Mood and Affect: Mood normal.        Behavior: Behavior normal.      Assessment and Plan:        Postoperative examination  Normal postop exam.  Pathology reviewed with patient. Cleared to return to work/activities. All questions answered.  RTO for annual with Dr. Zella Hidalgo, MD

## 2024-05-15 ENCOUNTER — Ambulatory Visit (HOSPITAL_COMMUNITY)
Admission: RE | Admit: 2024-05-15 | Discharge: 2024-05-15 | Disposition: A | Discharge status: Home / Self Care | Source: Ambulatory Visit | Attending: Gastroenterology | Admitting: Gastroenterology

## 2024-05-15 DIAGNOSIS — K760 Fatty (change of) liver, not elsewhere classified: Secondary | ICD-10-CM | POA: Diagnosis present

## 2024-05-15 DIAGNOSIS — R7989 Other specified abnormal findings of blood chemistry: Secondary | ICD-10-CM | POA: Diagnosis present

## 2024-05-15 DIAGNOSIS — K219 Gastro-esophageal reflux disease without esophagitis: Secondary | ICD-10-CM | POA: Diagnosis present

## 2024-05-15 DIAGNOSIS — K900 Celiac disease: Secondary | ICD-10-CM | POA: Diagnosis present

## 2024-05-16 ENCOUNTER — Ambulatory Visit: Payer: Self-pay | Admitting: Gastroenterology

## 2024-05-19 LAB — COMPREHENSIVE METABOLIC PANEL WITH GFR
ALT: 32 IU/L (ref 0–32)
AST: 22 IU/L (ref 0–40)
Albumin: 4.4 g/dL (ref 3.9–4.9)
Alkaline Phosphatase: 97 IU/L (ref 44–121)
BUN/Creatinine Ratio: 20 (ref 12–28)
BUN: 17 mg/dL (ref 8–27)
Bilirubin Total: 0.5 mg/dL (ref 0.0–1.2)
CO2: 25 mmol/L (ref 20–29)
Calcium: 10 mg/dL (ref 8.7–10.3)
Chloride: 104 mmol/L (ref 96–106)
Creatinine, Ser: 0.86 mg/dL (ref 0.57–1.00)
Globulin, Total: 2.2 g/dL (ref 1.5–4.5)
Glucose: 85 mg/dL (ref 70–99)
Potassium: 4.8 mmol/L (ref 3.5–5.2)
Sodium: 143 mmol/L (ref 134–144)
Total Protein: 6.6 g/dL (ref 6.0–8.5)
eGFR: 73 mL/min/{1.73_m2} (ref >59)

## 2024-05-19 LAB — CBC WITH DIFFERENTIAL/PLATELET
Basophils Absolute: 0.1 10*3/uL (ref 0.0–0.2)
Basos: 1 %
EOS (ABSOLUTE): 0.2 10*3/uL (ref 0.0–0.4)
Eos: 3 %
Hematocrit: 40.6 % (ref 34.0–46.6)
Hemoglobin: 13.5 g/dL (ref 11.1–15.9)
Immature Grans (Abs): 0 10*3/uL (ref 0.0–0.1)
Immature Granulocytes: 0 %
Lymphocytes Absolute: 2.2 10*3/uL (ref 0.7–3.1)
Lymphs: 30 %
MCH: 30.8 pg (ref 26.6–33.0)
MCHC: 33.3 g/dL (ref 31.5–35.7)
MCV: 93 fL (ref 79–97)
Monocytes Absolute: 0.4 10*3/uL (ref 0.1–0.9)
Monocytes: 6 %
Neutrophils Absolute: 4.5 10*3/uL (ref 1.4–7.0)
Neutrophils: 60 %
Platelets: 290 10*3/uL (ref 150–450)
RBC: 4.38 x10E6/uL (ref 3.77–5.28)
RDW: 13.2 % (ref 11.7–15.4)
WBC: 7.4 10*3/uL (ref 3.4–10.8)

## 2024-05-19 LAB — TISSUE TRANSGLUTAMINASE, IGA: Transglutaminase IgA: 28 U/mL — ABNORMAL HIGH (ref 0–3)

## 2024-05-26 ENCOUNTER — Emergency Department (HOSPITAL_COMMUNITY)
Admission: EM | Admit: 2024-05-26 | Discharge: 2024-05-26 | Disposition: A | Discharge status: Home / Self Care | Attending: Emergency Medicine | Admitting: Emergency Medicine

## 2024-05-26 ENCOUNTER — Other Ambulatory Visit: Payer: Self-pay

## 2024-05-26 ENCOUNTER — Encounter (HOSPITAL_COMMUNITY): Payer: Self-pay

## 2024-05-26 ENCOUNTER — Emergency Department (HOSPITAL_COMMUNITY)

## 2024-05-26 DIAGNOSIS — I480 Paroxysmal atrial fibrillation: Secondary | ICD-10-CM | POA: Insufficient documentation

## 2024-05-26 DIAGNOSIS — Z79899 Other long term (current) drug therapy: Secondary | ICD-10-CM | POA: Insufficient documentation

## 2024-05-26 DIAGNOSIS — Z91040 Latex allergy status: Secondary | ICD-10-CM | POA: Insufficient documentation

## 2024-05-26 DIAGNOSIS — Z7901 Long term (current) use of anticoagulants: Secondary | ICD-10-CM | POA: Diagnosis not present

## 2024-05-26 DIAGNOSIS — E039 Hypothyroidism, unspecified: Secondary | ICD-10-CM | POA: Diagnosis not present

## 2024-05-26 DIAGNOSIS — I951 Orthostatic hypotension: Secondary | ICD-10-CM | POA: Diagnosis not present

## 2024-05-26 DIAGNOSIS — R42 Dizziness and giddiness: Secondary | ICD-10-CM | POA: Diagnosis present

## 2024-05-26 LAB — CBC WITH DIFFERENTIAL/PLATELET
Abs Immature Granulocytes: 0.03 10*3/uL (ref 0.00–0.07)
Basophils Absolute: 0.1 10*3/uL (ref 0.0–0.1)
Basophils Relative: 1 %
Eosinophils Absolute: 0.3 10*3/uL (ref 0.0–0.5)
Eosinophils Relative: 4 %
HCT: 43.1 % (ref 36.0–46.0)
Hemoglobin: 14.1 g/dL (ref 12.0–15.0)
Immature Granulocytes: 0 %
Lymphocytes Relative: 25 %
Lymphs Abs: 2.3 10*3/uL (ref 0.7–4.0)
MCH: 30.5 pg (ref 26.0–34.0)
MCHC: 32.7 g/dL (ref 30.0–36.0)
MCV: 93.1 fL (ref 80.0–100.0)
Monocytes Absolute: 0.6 10*3/uL (ref 0.1–1.0)
Monocytes Relative: 7 %
Neutro Abs: 5.9 10*3/uL (ref 1.7–7.7)
Neutrophils Relative %: 63 %
Platelets: 330 10*3/uL (ref 150–400)
RBC: 4.63 MIL/uL (ref 3.87–5.11)
RDW: 13.3 % (ref 11.5–15.5)
WBC: 9.3 10*3/uL (ref 4.0–10.5)
nRBC: 0 % (ref 0.0–0.2)

## 2024-05-26 LAB — TROPONIN I (HIGH SENSITIVITY)
Troponin I (High Sensitivity): 4 ng/L (ref <18)
Troponin I (High Sensitivity): 5 ng/L (ref <18)

## 2024-05-26 LAB — BASIC METABOLIC PANEL WITH GFR
Anion gap: 11 (ref 5–15)
BUN: 16 mg/dL (ref 8–23)
CO2: 26 mmol/L (ref 22–32)
Calcium: 9.9 mg/dL (ref 8.9–10.3)
Chloride: 103 mmol/L (ref 98–111)
Creatinine, Ser: 0.91 mg/dL (ref 0.44–1.00)
GFR, Estimated: >60 mL/min (ref >60)
Glucose, Bld: 92 mg/dL (ref 70–99)
Potassium: 4.1 mmol/L (ref 3.5–5.1)
Sodium: 140 mmol/L (ref 135–145)

## 2024-05-26 LAB — TSH: TSH: 2.084 u[IU]/mL (ref 0.350–4.500)

## 2024-05-26 MED ORDER — SODIUM CHLORIDE 0.9 % IV BOLUS
1000.0000 mL | Freq: Once | INTRAVENOUS | Status: AC
  Administered 2024-05-26: 1000 mL via INTRAVENOUS

## 2024-05-26 MED ORDER — SODIUM CHLORIDE 0.9 % IV BOLUS
500.0000 mL | Freq: Once | INTRAVENOUS | Status: AC
  Administered 2024-05-26: 500 mL via INTRAVENOUS

## 2024-05-26 MED ORDER — LORAZEPAM 2 MG/ML IJ SOLN
1.0000 mg | Freq: Once | INTRAMUSCULAR | Status: AC | PRN
  Administered 2024-05-26: 1 mg via INTRAVENOUS
  Filled 2024-05-26: qty 1

## 2024-05-26 MED ORDER — MECLIZINE HCL 12.5 MG PO TABS
12.5000 mg | ORAL_TABLET | Freq: Once | ORAL | Status: AC
  Administered 2024-05-26: 12.5 mg via ORAL
  Filled 2024-05-26: qty 1

## 2024-05-26 NOTE — ED Notes (Signed)
 Pt/family received d/c paperwork at this time. After going over the paperwork any questions, comments, or concerns were answered to the best of this nurse's knowledge. The pt/family verbally acknowledged the teachings/instructions.

## 2024-05-26 NOTE — Discharge Instructions (Signed)
 Decrease your metoprolol  to 12.5 mg in the morning and take 25 mg at night.

## 2024-05-26 NOTE — ED Triage Notes (Signed)
 Pt arrives with c/o A fib states it started on Thursday, she was having episodes of dizziness with the room spinning. Also reports palpitations with 'heart beating weird' and feeling weak since. Pt states she has upcoming cardiology apt at [facility].

## 2024-05-26 NOTE — Progress Notes (Addendum)
 Orthostatic VS  LAYING  BP 138/63 MAP 86 HR 72  O2 97  SITTING  BP 144/77 MAP 96 HR 66 O2 96  STANDING BP 117/73 MAP 88 HR 70 O2 95

## 2024-05-26 NOTE — ED Provider Notes (Signed)
 [place] EMERGENCY DEPARTMENT AT [facility] Provider Note   CSN: [id number] Arrival date & time: 05/26/24  1503     History  Chief Complaint  Patient presents with   Palpitations    Maria Meza is a 68 y.o. female.  Pt is a 68 yo female with pmhx significant for afib (on eliquis ), anxiety, hypothyroidism, tia, celiac disease, sleep apnea on CPAP, obesity, IBS, and arthritis.  Pt presents to the ED today with feeling dizzy.  HR has been going up and down.  She feels like she is going to pass out at times.  She is not sure if it is her afib or something else.  She has been cardioverted several times.  She has a referral to EP.  She has not tolerated meds for afib very well.  Pt did have to hold her eliquis  for 2 days before a procedure done on 5/5, but has been on it since then.       Home Medications Prior to Admission medications   Medication Sig Start Date End Date Taking? Authorizing Provider  apixaban  (ELIQUIS ) 5 MG TABS tablet Take 1 tablet (5 mg total) by mouth 2 (two) times daily. 04/01/24   Arleen Lacer, MD  Ascorbic Acid  (VITAMIN C ) 1000 MG tablet Take 1,000 mg by mouth daily.    [provider]  b complex vitamins tablet Take by mouth daily.    [provider]  cholecalciferol (VITAMIN D3) 25 MCG (1000 UNIT) tablet Take 400 mcg by mouth daily.    [provider]  folic acid  (FOLVITE ) 1 MG tablet Take 2 mg by mouth daily. 2 tablets daily 04/21/11   [provider]  latanoprost  (XALATAN ) 0.005 % ophthalmic solution Place 1 drop into both eyes daily.     [provider]  levothyroxine  (SYNTHROID ) 137 MCG tablet Take 137 mcg by mouth daily. 09/15/23   [provider]  magnesium  oxide (MAG-OX) 400 MG tablet TAKE 1 TABLET BY MOUTH TWICE A DAY 12/04/23   Arleen Lacer, MD  metoprolol  tartrate (LOPRESSOR ) 25 MG tablet Take 1 tablet (25 mg total) by mouth 2 (two) times daily. 05/10/24   Fenton, Clint R, PA   pantoprazole  (PROTONIX ) 40 MG tablet Take 1 tablet (40 mg total) by mouth daily. 11/02/23   Rourk, Windsor Hatcher, MD      Allergies    Amiodarone, Latex, Propafenone, Gluten meal, Hydrocodone, Propafenone hcl, and Verapamil     Review of Systems   Review of Systems  Cardiovascular:  Positive for palpitations.  Neurological:  Positive for dizziness and weakness.  All other systems reviewed and are negative.   Physical Exam Updated Vital Signs BP 125/61   Pulse 65   Temp 97.6 F (36.4 C)   Resp 20   Ht 5\' 5"  (1.651 m)   Wt 117.5 kg   LMP 04/16/2016 (Exact Date)   SpO2 96%   BMI 43.10 kg/m  Physical Exam Vitals and nursing note reviewed.  Constitutional:      Appearance: Normal appearance. She is obese.  HENT:     Head: Normocephalic and atraumatic.     Right Ear: External ear normal.     Left Ear: External ear normal.     Nose: Nose normal.     Mouth/Throat:     Mouth: Mucous membranes are moist.     Pharynx: Oropharynx is clear.  Eyes:     Extraocular Movements: Extraocular movements intact.     Conjunctiva/sclera: Conjunctivae  normal.     Pupils: Pupils are equal, round, and reactive to light.  Cardiovascular:     Rate and Rhythm: Regular rhythm. Bradycardia present.     Pulses: Normal pulses.     Heart sounds: Normal heart sounds.  Pulmonary:     Effort: Pulmonary effort is normal.     Breath sounds: Normal breath sounds.  Abdominal:     General: Abdomen is flat. Bowel sounds are normal.     Palpations: Abdomen is soft.  Musculoskeletal:        General: Normal range of motion.     Cervical back: Normal range of motion and neck supple.  Skin:    General: Skin is warm.     Capillary Refill: Capillary refill takes less than 2 seconds.  Neurological:     General: No focal deficit present.     Mental Status: She is alert.  Psychiatric:        Mood and Affect: Mood normal.     ED Results / Procedures / Treatments   Labs (all labs ordered are listed, but only  abnormal results are displayed) Labs Reviewed  BASIC METABOLIC PANEL WITH GFR  CBC WITH DIFFERENTIAL/PLATELET  TSH  TROPONIN I (HIGH SENSITIVITY)  TROPONIN I (HIGH SENSITIVITY)    EKG EKG Interpretation Date/Time:  Sunday May 26 2024 15:15:30 EDT Ventricular Rate:  62 PR Interval:  140 QRS Duration:  134 QT Interval:  428 QTC Calculation: 434 R Axis:   225  Text Interpretation: Normal sinus rhythm Right bundle branch block Possible Lateral infarct , age undetermined Abnormal ECG When compared with ECG of 10-May-2024 10:44, Nonspecific T wave abnormality now evident in Lateral leads No significant change since last tracing Confirmed by Sueellen Emery [phone]) on 05/26/2024 3:19:17 PM  Radiology MR BRAIN WO CONTRAST Result Date: 05/26/2024 EXAM: MRI BRAIN WITHOUT CONTRAST 05/26/2024 04:39:15 PM TECHNIQUE: Multiplanar multisequence MRI of the head/brain was performed without the administration of intravenous contrast. COMPARISON: None available. CLINICAL HISTORY: Neuro deficit, acute, stroke suspected. Pt arrives with c/o A fib states it started on Thursday, she was having episodes of dizziness with the room spinning. Also reports palpitations with 'heart beating weird' and feeling weak since. Pt states she has upcoming cardiology apt at [facility]. FINDINGS: BRAIN AND VENTRICLES: No acute infarct. No intracranial hemorrhage. No mass. No midline shift. No hydrocephalus. The sella is unremarkable. Normal flow voids. Scattered foci of T2 hyperintensity in the cerebral white matter, most consistent with mild chronic small vessel disease. ORBITS: No acute abnormality. SINUSES AND MASTOIDS: No acute abnormality. BONES AND SOFT TISSUES: Normal marrow signal. No acute soft tissue abnormality. IMPRESSION: 1. No acute infarct or other acute intracranial abnormality. 2. Mild chronic small vessel disease. Electronically signed by: Audra Blend MD 05/26/2024 05:36 PM EDT RP Workstation: [id number]   DG Chest  Portable 1 View Result Date: 05/26/2024 CLINICAL DATA:  Dizziness EXAM: PORTABLE CHEST 1 VIEW COMPARISON:  Chest x-ray 05/01/2024 FINDINGS: The heart size and mediastinal contours are within normal limits. Both lungs are clear. The visualized skeletal structures are unremarkable. IMPRESSION: No active disease. Electronically Signed   By: Tyron Gallon M.D.   On: 05/26/2024 16:26    Procedures Procedures    Medications Ordered in ED Medications  sodium chloride  0.9 % bolus 500 mL (500 mLs Intravenous New Bag/Given 05/26/24 1648)  meclizine (ANTIVERT) tablet 12.5 mg (12.5 mg Oral Given 05/26/24 1648)  LORazepam (ATIVAN) injection 1 mg (1 mg Intravenous Given 05/26/24 1617)  sodium chloride  0.9 %  bolus 1,000 mL (1,000 mLs Intravenous New Bag/Given 05/26/24 1819)    ED Course/ Medical Decision Making/ A&P                                 Medical Decision Making Amount and/or Complexity of Data Reviewed Labs: ordered. Radiology: ordered.  Risk Prescription drug management.   This patient presents to the ED for concern of palpitations/dizziness, this involves an extensive number of treatment options, and is a complaint that carries with it a high risk of complications and morbidity.  The differential diagnosis includes afib, svt, bradycardia,    Co morbidities that complicate the patient evaluation  afib (on eliquis ), anxiety, hypothyroidism, tia, celiac disease, sleep apnea on CPAP, obesity, IBS, and arthritis   Additional history obtained:  Additional history obtained from epic chart review External records from outside source obtained and reviewed including husband   Lab Tests:  I Ordered, and personally interpreted labs.  The pertinent results include:  cbc nl, bmp nl, trop nl times 2, tsh nl   Imaging Studies ordered:  I ordered imaging studies including cxr/mri  I independently visualized and interpreted imaging which showed  CXR: No active disease.  MRI brain: No acute  infarct or other acute intracranial abnormality.  2. Mild chronic small vessel disease.   I agree with the radiologist interpretation   Cardiac Monitoring:  The patient was maintained on a cardiac monitor.  I personally viewed and interpreted the cardiac monitored which showed an underlying rhythm of: sb to nsr   Medicines ordered and prescription drug management:  I ordered medication including antivert/ativan/fluids  for sx  Reevaluation of the patient after these medicines showed that the patient improved I have reviewed the patients home medicines and have made adjustments as needed   Test Considered:  mri   Critical Interventions:  mri   Problem List / ED Course:  Dizziness likely due to orthostatic hypotension.  As her HR is under control, I am going to decrease her metoprolol  to 12.5 mg in the am and 25 mg at night.  She does have an appt with EP this month.  She is feeling better.  She has not had a CVA.  She is stable for d/c.  Return if worse.    Reevaluation:  After the interventions noted above, I reevaluated the patient and found that they have :improved   Social Determinants of Health:  Lives at home   Dispostion:  After consideration of the diagnostic results and the patients response to treatment, I feel that the patent would benefit from discharge with outpatient f/u.          Final Clinical Impression(s) / ED Diagnoses Final diagnoses:  Orthostatic hypotension  On apixaban  therapy  Paroxysmal atrial fibrillation [facility])    Rx / DC Orders ED Discharge Orders     None         Sueellen Emery, MD 05/26/24 [phone]

## 2024-05-27 ENCOUNTER — Telehealth: Payer: Self-pay

## 2024-05-27 ENCOUNTER — Emergency Department (HOSPITAL_COMMUNITY)
Admission: EM | Admit: 2024-05-27 | Discharge: 2024-05-28 | Disposition: A | Discharge status: Home / Self Care | Attending: Student | Admitting: Student

## 2024-05-27 ENCOUNTER — Encounter (HOSPITAL_COMMUNITY): Payer: Self-pay

## 2024-05-27 ENCOUNTER — Ambulatory Visit: Admitting: Cardiovascular Disease

## 2024-05-27 ENCOUNTER — Other Ambulatory Visit: Payer: Self-pay

## 2024-05-27 DIAGNOSIS — R110 Nausea: Secondary | ICD-10-CM | POA: Insufficient documentation

## 2024-05-27 DIAGNOSIS — Z91040 Latex allergy status: Secondary | ICD-10-CM | POA: Insufficient documentation

## 2024-05-27 DIAGNOSIS — I10 Essential (primary) hypertension: Secondary | ICD-10-CM | POA: Insufficient documentation

## 2024-05-27 DIAGNOSIS — R001 Bradycardia, unspecified: Secondary | ICD-10-CM | POA: Diagnosis not present

## 2024-05-27 DIAGNOSIS — Z79899 Other long term (current) drug therapy: Secondary | ICD-10-CM | POA: Insufficient documentation

## 2024-05-27 DIAGNOSIS — R42 Dizziness and giddiness: Secondary | ICD-10-CM | POA: Insufficient documentation

## 2024-05-27 DIAGNOSIS — Z7901 Long term (current) use of anticoagulants: Secondary | ICD-10-CM | POA: Diagnosis not present

## 2024-05-27 LAB — CBC WITH DIFFERENTIAL/PLATELET
Abs Immature Granulocytes: 0.03 10*3/uL (ref 0.00–0.07)
Basophils Absolute: 0.1 10*3/uL (ref 0.0–0.1)
Basophils Relative: 1 %
Eosinophils Absolute: 0.4 10*3/uL (ref 0.0–0.5)
Eosinophils Relative: 4 %
HCT: 39.7 % (ref 36.0–46.0)
Hemoglobin: 13.2 g/dL (ref 12.0–15.0)
Immature Granulocytes: 0 %
Lymphocytes Relative: 24 %
Lymphs Abs: 2.3 10*3/uL (ref 0.7–4.0)
MCH: 31.4 pg (ref 26.0–34.0)
MCHC: 33.2 g/dL (ref 30.0–36.0)
MCV: 94.3 fL (ref 80.0–100.0)
Monocytes Absolute: 0.6 10*3/uL (ref 0.1–1.0)
Monocytes Relative: 6 %
Neutro Abs: 6.3 10*3/uL (ref 1.7–7.7)
Neutrophils Relative %: 65 %
Platelets: 320 10*3/uL (ref 150–400)
RBC: 4.21 MIL/uL (ref 3.87–5.11)
RDW: 13.4 % (ref 11.5–15.5)
WBC: 9.6 10*3/uL (ref 4.0–10.5)
nRBC: 0 % (ref 0.0–0.2)

## 2024-05-27 LAB — BASIC METABOLIC PANEL WITH GFR
Anion gap: 11 (ref 5–15)
BUN: 14 mg/dL (ref 8–23)
CO2: 24 mmol/L (ref 22–32)
Calcium: 9.7 mg/dL (ref 8.9–10.3)
Chloride: 107 mmol/L (ref 98–111)
Creatinine, Ser: 0.98 mg/dL (ref 0.44–1.00)
GFR, Estimated: >60 mL/min (ref >60)
Glucose, Bld: 98 mg/dL (ref 70–99)
Potassium: 4.3 mmol/L (ref 3.5–5.1)
Sodium: 142 mmol/L (ref 135–145)

## 2024-05-27 LAB — MAGNESIUM: Magnesium: 2 mg/dL (ref 1.7–2.4)

## 2024-05-27 MED ORDER — MECLIZINE HCL 25 MG PO TABS
25.0000 mg | ORAL_TABLET | Freq: Once | ORAL | Status: AC
  Administered 2024-05-28: 25 mg via ORAL
  Filled 2024-05-27: qty 1

## 2024-05-27 NOTE — ED Triage Notes (Signed)
 Pt arrived from home via POV c/o dizziness that is noted each time that she becomes hypertensive and palpitations with out going in to afib since Thursday. Pt states that she was just seen at [facility] for the same yesterday. Pt states that they completed an MRI yesterday there. [facility] in triage 0

## 2024-05-27 NOTE — ED Provider Triage Note (Signed)
 Emergency Medicine Provider Triage Evaluation Note  Maria Meza , a 68 y.o. female  was evaluated in triage.  Pt complains of dizziness, worsened with position changes.  It has been intermittent for a few days.  She was seen yesterday at [facility] and had a workup including MRI which was negative for stroke.  She did feel better yesterday after taking Antivert but was not prescribed the same at that time.  Plans for EP visit later this month for possible ablation due to history of A-fib.  Review of Systems  Positive:  Negative:   Physical Exam  BP (!) 148/60 (BP Location: Right Arm)   Pulse 62   Temp 98.3 F (36.8 C)   Resp 16   Ht 5\' 5"  (1.651 m)   Wt 117.5 kg   LMP 04/16/2016 (Exact Date)   SpO2 96%   BMI 43.10 kg/m  Gen:   Awake, no distress   Resp:  Normal effort  MSK:   Moves extremities without difficulty  Other:    Medical Decision Making  Medically screening exam initiated at 11:26 PM.  Appropriate orders placed.  Maria Meza was informed that the remainder of the evaluation will be completed by another provider, this initial triage assessment does not replace that evaluation, and the importance of remaining in the ED until their evaluation is complete.     Maria Guest, PA-C 05/27/24 2326

## 2024-05-27 NOTE — Telephone Encounter (Signed)
 PAP: Patient has been denied for patient assistance by [employer] ([employer]) due to 3% OUT OF POCKET SPENDING NOT MET. LETTER HAS BEEN SENT TO PT. Routing to clinic as FYI in case therapy needs adjusting.

## 2024-05-28 DIAGNOSIS — R42 Dizziness and giddiness: Secondary | ICD-10-CM | POA: Diagnosis not present

## 2024-05-28 MED ORDER — MECLIZINE HCL 25 MG PO TABS: 12.5000 mg | ORAL_TABLET | Freq: Three times a day (TID) | ORAL | 0 refills | Status: AC | PRN

## 2024-05-28 NOTE — ED Provider Notes (Signed)
 [place] EMERGENCY DEPARTMENT AT [facility] Provider Note   CSN: [id number] Arrival date & time: 05/27/24  2205     History  Chief Complaint  Patient presents with   Dizziness   Hypertension    Maria Meza is a 68 y.o. female.  Patient presents to the emergency department with past medical history significant for PVCs, A-fib RVR, episodic lightheadedness presents the emergency room complaining of positional dizziness.  Patient was evaluated yesterday at an outside emergency department with workup that included MRI.  Workup was negative for stroke.  She felt better after meclizine and discharged home.  She endorses repeat episode of the same today.  Positioning makes her dizziness worse.  She does in triage report hypertensive episodes and rapid heart rate associated with the dizziness but when asked about these her reported blood pressure is still relatively within normal limits, roughly 140s over 60s and her heart rate races into the 70s.  She states she is not getting a rapid heart rate like she has had in the past with her atrial fibrillation.  She is scheduled to see EP later this month for possible ablation due to episodes of atrial fibrillation.  She denies chest pain, shortness of breath, abdominal pain, vomiting.  She does endorse some mild nausea associated with the dizziness.   Dizziness Hypertension       Home Medications Prior to Admission medications   Medication Sig Start Date End Date Taking? Authorizing Provider  meclizine (ANTIVERT) 25 MG tablet Take 0.5 tablets (12.5 mg total) by mouth 3 (three) times daily as needed for dizziness. 05/28/24  Yes Elisa Guest, PA-C  apixaban  (ELIQUIS ) 5 MG TABS tablet Take 1 tablet (5 mg total) by mouth 2 (two) times daily. 04/01/24   Arleen Lacer, MD  Ascorbic Acid  (VITAMIN C ) 1000 MG tablet Take 1,000 mg by mouth daily.    [provider]  b complex vitamins tablet Take by mouth daily.    [provider]  cholecalciferol (VITAMIN D3) 25 MCG (1000 UNIT) tablet Take 400 mcg by mouth daily.    [provider]  folic acid  (FOLVITE ) 1 MG tablet Take 2 mg by mouth daily. 2 tablets daily 04/21/11   [provider]  latanoprost  (XALATAN ) 0.005 % ophthalmic solution Place 1 drop into both eyes daily.     [provider]  levothyroxine  (SYNTHROID ) 137 MCG tablet Take 137 mcg by mouth daily. 09/15/23   [provider]  magnesium  oxide (MAG-OX) 400 MG tablet TAKE 1 TABLET BY MOUTH TWICE A DAY 12/04/23   Arleen Lacer, MD  metoprolol  tartrate (LOPRESSOR ) 25 MG tablet Take 1 tablet (25 mg total) by mouth 2 (two) times daily. 05/10/24   Fenton, Clint R, PA  pantoprazole  (PROTONIX ) 40 MG tablet Take 1 tablet (40 mg total) by mouth daily. 11/02/23   Rourk, Windsor Hatcher, MD      Allergies    Amiodarone, Latex, Propafenone, Gluten meal, Hydrocodone, Propafenone hcl, and Verapamil     Review of Systems   Review of Systems  Neurological:  Positive for dizziness.    Physical Exam Updated Vital Signs BP (!) 160/65 (BP Location: Right Arm)   Pulse (!) 56   Temp 97.7 F (36.5 C)   Resp 18   Ht 5\' 5"  (1.651 m)   Wt 117.5 kg   LMP 04/16/2016 (Exact Date)   SpO2 98%   BMI 43.10 kg/m  Physical Exam Vitals and nursing note reviewed.  Constitutional:      Appearance: Normal appearance. She is obese.  HENT:     Head: Normocephalic and atraumatic.     Right Ear: External ear normal.     Left Ear: External ear normal.     Nose: Nose normal.     Mouth/Throat:     Mouth: Mucous membranes are moist.     Pharynx: Oropharynx is clear.  Eyes:     Extraocular Movements: Extraocular movements intact.     Conjunctiva/sclera: Conjunctivae normal.     Pupils: Pupils are equal, round, and reactive to light.  Cardiovascular:     Rate and Rhythm: Regular rhythm. Bradycardia present.     Pulses: Normal pulses.     Heart sounds: Normal heart sounds.  Pulmonary:      Effort: Pulmonary effort is normal.     Breath sounds: Normal breath sounds.  Abdominal:     General: Abdomen is flat. Bowel sounds are normal.     Palpations: Abdomen is soft.  Musculoskeletal:        General: Normal range of motion.     Cervical back: Normal range of motion and neck supple.  Skin:    General: Skin is warm.     Capillary Refill: Capillary refill takes less than 2 seconds.  Neurological:     General: No focal deficit present.     Mental Status: She is alert.  Psychiatric:        Mood and Affect: Mood normal.     ED Results / Procedures / Treatments   Labs (all labs ordered are listed, but only abnormal results are displayed) Labs Reviewed  BASIC METABOLIC PANEL WITH GFR  CBC WITH DIFFERENTIAL/PLATELET  MAGNESIUM     EKG None  Radiology MR BRAIN WO CONTRAST Result Date: 05/26/2024 EXAM: MRI BRAIN WITHOUT CONTRAST 05/26/2024 04:39:15 PM TECHNIQUE: Multiplanar multisequence MRI of the head/brain was performed without the administration of intravenous contrast. COMPARISON: None available. CLINICAL HISTORY: Neuro deficit, acute, stroke suspected. Pt arrives with c/o A fib states it started on Thursday, she was having episodes of dizziness with the room spinning. Also reports palpitations with 'heart beating weird' and feeling weak since. Pt states she has upcoming cardiology apt at [facility]. FINDINGS: BRAIN AND VENTRICLES: No acute infarct. No intracranial hemorrhage. No mass. No midline shift. No hydrocephalus. The sella is unremarkable. Normal flow voids. Scattered foci of T2 hyperintensity in the cerebral white matter, most consistent with mild chronic small vessel disease. ORBITS: No acute abnormality. SINUSES AND MASTOIDS: No acute abnormality. BONES AND SOFT TISSUES: Normal marrow signal. No acute soft tissue abnormality. IMPRESSION: 1. No acute infarct or other acute intracranial abnormality. 2. Mild chronic small vessel disease. Electronically signed by: Audra Blend  MD 05/26/2024 05:36 PM EDT RP Workstation: [id number]   DG Chest Portable 1 View Result Date: 05/26/2024 CLINICAL DATA:  Dizziness EXAM: PORTABLE CHEST 1 VIEW COMPARISON:  Chest x-ray 05/01/2024 FINDINGS: The heart size and mediastinal contours are within normal limits. Both lungs are clear. The visualized skeletal structures are unremarkable. IMPRESSION: No active disease. Electronically Signed   By: Tyron Gallon M.D.   On: 05/26/2024 16:26    Procedures Procedures    Medications Ordered in ED Medications  meclizine (ANTIVERT) tablet 25 mg (25 mg Oral Given 05/28/24 0006)    ED Course/ Medical Decision Making/ A&P  Medical Decision Making Amount and/or Complexity of Data Reviewed Labs: ordered.   This patient presents to the ED for concern of dizziness, this involves an extensive number of treatment options, and is a complaint that carries with it a high risk of complications and morbidity.  The differential diagnosis includes BPPV, dehydration, dysrhythmia, intracranial abnormality, others   Co morbidities / Chronic conditions that complicate the patient evaluation  A-fib   Additional history obtained:  Additional history obtained from EMR  Lab Tests:  I Ordered, and personally interpreted labs.  The pertinent results include: Unremarkable CBC, BMP, magnesium    Imaging Studies ordered:  I reviewed imaging from yesterday including an MRI of the head which showed no sign of stroke    Problem List / ED Course / Critical interventions / Medication management   I ordered medication including meclizine Reevaluation of the patient after these medicines showed that the patient improved I have reviewed the patients home medicines and have made adjustments as needed   Test / Admission - Considered:  Patient with complete resolution of symptoms after meclizine.  Patient is asymptomatic at this time and would like to discharge home.  Her  labs are unremarkable.  She is not lightheaded.  Her heart rhythm is regular and has a regular rate.  I see no indication for repeat imaging at this time.  Will discharge home with meclizine and neurology follow up         Final Clinical Impression(s) / ED Diagnoses Final diagnoses:  Vertigo    Rx / DC Orders ED Discharge Orders          Ordered    Ambulatory referral to Neurology       Comments: An appointment is requested in approximately: 2 weeks   05/28/24 0223    meclizine (ANTIVERT) 25 MG tablet  3 times daily PRN        05/28/24 0224              Elisa Guest, PA-C 05/28/24 0224    Eve Hinders, MD 05/28/24 1610

## 2024-05-28 NOTE — Discharge Instructions (Addendum)
 I have prescribed a medication called meclizine which can help with your lightheadedness.   I also placed a referral with neurology for further evaluation.  You may follow-up as well with your primary care provider.  If you develop any life-threatening symptoms please return to the emergency department.

## 2024-05-28 NOTE — Progress Notes (Unsigned)
 Cardiology Office Note:    Date:  05/30/2024   ID:  Maria Meza, DOB 1956-07-26, MRN [id number]  PCP:  Omie Bickers, MD  Cardiologist:  Randene Bustard, MD Electrophysiologist:  Efraim Grange, MD     Referring MD: Omie Bickers, MD   Chief Complaint: ED follow-up of dizziness  History of Present Illness:    Maria Meza is a 68 y.o. female with a history of persistent atrial fibrillation s/p multiple DCCVs (most recently in 04/2024) on Eliquis , paroxysmal SVT, PVCs,  hypothyroidism, TIA, GERD, IBS, obstructive sleep apnea on CPAP, and connective tissue disease who is followed by Dr. Addie Holstein and Dr. Arlester Ladd and presents today for ED follow-up of dizziness.  Patient has a long history of palpitations due to symptomatic PVCs and paroxysmal SVT. She was subsequently diagnosed with atrial fibrillation with RVR during an admission in 08/2022. She was started on Eliquis  and underwent successful TEE/ DCCV at that time. She was seen in the ED in 10/2022 for recurrent atrial fibrillation and underwent repeat DCCV at that time. She was seen by Dr. Arlester Ladd in 11/2022 and atrial fibrillation was planned. However, her thyroid  levels were checked and she was noted to be hyperthyroid. Therefore, her Synthroid  dose was reduced and ablation was cancelled under her thyroid  level was better controlled. She has tried multiple antiarrhythmics in the past, including Amiodarone and Propafenone, but did not tolerate them well.  She was recently readmitted from 05/01/2024 to 05/02/2024 recurrent atrial fibrillation. She underwent repeat TEE/ DCCV with successful restoration of sinus rhythm. TEE showed LVEF of 60-65%, normal RV function, and mild MR.  She was seen in the [facility] on 05/10/2024 for follow-up at which time she was maintaining sinus rhythm. Lopressor  was decreased due to bradycardiac and fatigue. She was referred back to Dr. Arlester Ladd to discuss atrial fibrillation ablation.   She was seen by Cardiology  at [facility] on 05/24/2024 who also recommended EP evaluation for atrial fibrillation ablation.  She was seen in the ED on 05/26/2024 for further evaluation of dizziness and near syncope and feeling like her heart rate was going up and down. EKG showed normal sinus rhythm and high-sensitivity troponin negative x2. TSH was normal. Brain MRI showed no acute findings. Dizziness was felt to be due to orthostatic hypotension. She was given IV fluids and Lopressor  was decreased.  She presented back to the ED on 05/27/2024 for recurrent positional dizziness. She was was given Meclizine with improvement so dizziness was felt to likely be due to vertigo. She was discharged with a prescription of Meclizine and referred to Neurology.  Patient presents today for follow-up.  Here with her husband.  She reports intermittent palpitations over the last week that feel different than her usual palpitations.  She describes it as her heart rate going up and then dropping.  This does not feel like her prior atrial fibrillation. She describes one episode on 05/23/2024 where her legs got very weak and "shaky," and she felt like she was going to pass out. She also describes an episode on the evening of 05/27/2024 after leaving the ED where she woke up from sleep and was "freezing."  She has floaters in her eyes at baseline but says they looked more prominent.  She also states she felt like she was going to pass out.  Symptoms gradually resolved. However since then, she has been slowly feeling better.  She states yesterday she felt better than the day before, and today she  feels even better.  She reports some shortness of breath with the palpitations last week but this has been better the last few days.  No orthopnea, PND, or edema.  She has not had any more dizziness since leaving the ED and has not had to take any Meclizine.  However, she does describe the dizziness that she was having as a sensation like the room was spinning, which I agree  sounds like vertigo.  No syncope.  EKGs/Labs/Other Studies Reviewed:    The following studies were reviewed:  Monitor 01/2022: 14-day Zio patch monitor worn for 12 days, 19 hours. February 2023 Predominant rhythm sinus rhythm: Minimum heart rate 46 bpm, maximum heart rate 99 bpm with an average of 61 bpm. Rare PACs and PVCs with rare couplets and only PAC triplets. No bigeminy or trigeminy.--These were what was noted on patient diary. 1 4 beat run of what looks like a narrow complex tachycardia read by the machine as ventricular tachycardia. Max rate 148 bpm. 35 atrial runs: Fastest interval was 9 beats at a rate of 102 bpm. Longest was 22 beats lasting 12.1 seconds at an average rate of 108 bpm.  For the most part, symptoms are noted with PACs or PVCs in sinus rhythm, 3-4 atrial runs were also noted.  _______________  TEE/ DCCV 05/02/2024: Impressions:  1. Left ventricular ejection fraction, by estimation, is 60 to 65%. The  left ventricle has normal function. The left ventricle has no regional  wall motion abnormalities.   2. Right ventricular systolic function is normal. The right ventricular  size is normal.   3. No left atrial/left atrial appendage thrombus was detected.   4. The mitral valve is normal in structure. Mild mitral valve  regurgitation.   5. The aortic valve is tricuspid. Aortic valve regurgitation is not  visualized. No aortic stenosis is present.   Conclusion(s)/Recommendation(s): Normal biventricular function without  evidence of hemodynamically significant valvular heart disease. No LA/LAA  thrombus identified. Successful cardioversion performed with restoration  of normal sinus rhythm.   EKG:  EKG not ordered today.   Recent Labs: 05/15/2024: ALT 32 05/26/2024: TSH 2.084 05/27/2024: BUN 14; Creatinine, Ser 0.98; Hemoglobin 13.2; Magnesium  2.0; Platelets 320; Potassium 4.3; Sodium 142  Recent Lipid Panel No results found for: "CHOL", "TRIG", "HDL", "CHOLHDL",  "VLDL", "LDLCALC", "LDLDIRECT"  Physical Exam:    Vital Signs: BP 138/68 (BP Location: Left Arm, Patient Position: Sitting, Cuff Size: Normal)   Pulse 77   Ht 5\' 5"  (1.651 m)   Wt 255 lb (115.7 kg)   LMP 04/16/2016 (Exact Date)   SpO2 96%   BMI 42.43 kg/m     Wt Readings from Last 3 Encounters:  05/30/24 255 lb (115.7 kg)  05/27/24 259 lb (117.5 kg)  05/26/24 259 lb (117.5 kg)     General: 68 y.o.obese Caucasian female in no acute distress. HEENT: Normocephalic and atraumatic. Sclera clear.  Neck: Supple. No carotid bruits. No JVD. Heart: RRR. Distinct S1 and S2. No murmurs, gallops, or rubs.  Lungs: No increased work of breathing. Clear to ausculation bilaterally. No wheezes, rhonchi, or rales.  Extremities: No lower extremity edema.   Skin: Warm and dry. Neuro: No focal deficits. Psych: Normal affect. Responds appropriately.   Assessment:    1. Palpitations   2. Persistent atrial fibrillation (HCC)   3. Paroxysmal SVT (supraventricular tachycardia) (HCC)   4. Dizziness   5. Obstructive sleep apnea     Plan:    Palpitations Persistent Atrial Fibrillation Paroxysmal  SVT Patient has a long history of palpitations due to symptomatic PVCs and paroxysmal SVT. She was then diagnosed with atrial fibrillation in 08/2022 and has undergone multiple cardioversions since then (most recently in 04/2024). She has been unable to tolerate Amiodarone and Propafenone in the past. She was recently seen by the [facility] in 04/2024 and referred back to Dr. Arlester Ladd for consideration of ablation.  - She reports intermittent palpitations that feel different than her usual atrial fibrillation over the last few week. She also describes a couple of episodes of near syncope during this time as described above. EKG during ED visit earlier this week showed normal sinus rhythm, and she is maintaining sinus rhythm on exam.  - Continue Lopressor  12.5mg  in the morning and 25mg  in the evening.  - Continue  Eliquis  5mg  twice daily.  - Will order a 1 week Zio monitor.  - She is scheduled to see an EP at [facility] later this month for consideration of atrial fibrillation ablation.  Dizziness Patient has had recent ED visits earlier this week for dizziness. Brain MRI showed no acute findings. Symptoms were felt to be due to orthostatic hypotension during the 1st visit and vertigo during the 2nd visit. She was prescribed Meclizine and referred to Neurology.  - No recurrent dizziness since leaving the ED. Has not required any Meclizine.  - She does describe the dizziness as the room spinning which I agree sounds like vertigo. Recommend she follow-up with PCP if this occurs. Continue PRN Meclizine.   Obstructive Sleep Apnea - Continue CPAP.  Disposition: Patient already has a follow-up with Dr. Addie Holstein scheduled for 07/2024.   Signed, Leigh Kaeding E Oakley Orban, PA-C  05/30/2024 7:52 PM    [facility]

## 2024-05-30 ENCOUNTER — Encounter: Payer: Self-pay | Admitting: Student

## 2024-05-30 ENCOUNTER — Ambulatory Visit: Attending: Student | Admitting: Student

## 2024-05-30 VITALS — BP 138/68 | HR 77 | Ht 65.0 in | Wt 255.0 lb

## 2024-05-30 DIAGNOSIS — R002 Palpitations: Secondary | ICD-10-CM | POA: Insufficient documentation

## 2024-05-30 DIAGNOSIS — I471 Supraventricular tachycardia, unspecified: Secondary | ICD-10-CM | POA: Diagnosis present

## 2024-05-30 DIAGNOSIS — G4733 Obstructive sleep apnea (adult) (pediatric): Secondary | ICD-10-CM | POA: Diagnosis present

## 2024-05-30 DIAGNOSIS — I4819 Other persistent atrial fibrillation: Secondary | ICD-10-CM | POA: Diagnosis not present

## 2024-05-30 DIAGNOSIS — R42 Dizziness and giddiness: Secondary | ICD-10-CM | POA: Insufficient documentation

## 2024-05-30 MED ORDER — METOPROLOL TARTRATE 25 MG PO TABS: ORAL_TABLET | ORAL | Status: AC

## 2024-05-30 NOTE — Patient Instructions (Addendum)
 Medication Instructions:  NO CHANGES *If you need a refill on your cardiac medications before your next appointment, please call your pharmacy*  Lab Work: NO LABS If you have labs (blood work) drawn today and your tests are completely normal, you will receive your results only by: MyChart Message (if you have MyChart) OR A paper copy in the mail If you have any lab test that is abnormal or we need to change your treatment, we will call you to review the results.  Testing/Procedures: Maria Meza- Long Term Monitor Instructions  Your physician has requested you wear a ZIO patch monitor for 7 days.  This is a single patch monitor. [employer] supplies one patch monitor per enrollment. Additional stickers are not available. Please do not apply patch if you will be having a Nuclear Stress Test,  Echocardiogram, Cardiac CT, MRI, or Chest Xray during the period you would be wearing the  monitor. The patch cannot be worn during these tests. You cannot remove and re-apply the  ZIO XT patch monitor.  Your ZIO patch monitor will be mailed 3 day USPS to your address on file. It may take 3-5 days  to receive your monitor after you have been enrolled.  Once you have received your monitor, please review the enclosed instructions. Your monitor  has already been registered assigning a specific monitor serial # to you.  Billing and Patient Assistance Program Information  We have supplied [employer] with any of your insurance information on file for billing purposes. [employer] offers a sliding scale Patient Assistance Program for patients that do not have  insurance, or whose insurance does not completely cover the cost of the ZIO monitor.  You must apply for the Patient Assistance Program to qualify for this discounted rate.  To apply, please call [employer] at [phone], select option 4, select option 2, ask to apply for  Patient Assistance Program. Sanna Crystal will ask your household income, and how many people  are  in your household. They will quote your out-of-pocket cost based on that information.  [employer] will also be able to set up a 33-month, interest-free payment plan if needed.  Applying the monitor   Shave hair from upper left chest.  Hold abrader disc by orange tab. Rub abrader in 40 strokes over the upper left chest as  indicated in your monitor instructions.  Clean area with 4 enclosed alcohol pads. Let dry.  Apply patch as indicated in monitor instructions. Patch will be placed under collarbone on left  side of chest with arrow pointing upward.  Rub patch adhesive wings for 2 minutes. Remove white label marked "1". Remove the white  label marked "2". Rub patch adhesive wings for 2 additional minutes.  While looking in a mirror, press and release button in center of patch. A small green light will  flash 3-4 times. This will be your only indicator that the monitor has been turned on.  Do not shower for the first 24 hours. You may shower after the first 24 hours.  Press the button if you feel a symptom. You will hear a small click. Record Date, Time and  Symptom in the Patient Logbook.  When you are ready to remove the patch, follow instructions on the last 2 pages of Patient  Logbook. Stick patch monitor onto the last page of Patient Logbook.  Place Patient Logbook in the blue and white box. Use locking tab on box and tape box closed  securely. The blue and white box has prepaid  postage on it. Please place it in the mailbox as  soon as possible. Your physician should have your test results approximately 7 days after the  monitor has been mailed back to [facility].  Call [facility] Customer Care at [phone] if you have questions regarding  your ZIO XT patch monitor. Call them immediately if you see an orange light blinking on your  monitor.  If your monitor falls off in less than 4 days, contact our Monitor department at [phone].  If your monitor becomes loose or falls  off after 4 days call [employer] at [phone] for  suggestions on securing your monitor   Follow-Up: At [facility], you and your health needs are our priority.  As part of our continuing mission to provide you with exceptional heart care, our providers are all part of one team.  This team includes your primary Cardiologist (physician) and Advanced Practice Providers or APPs (Physician Assistants and Nurse Practitioners) who all work together to provide you with the care you need, when you need it.  Your next appointment:   KEEP FOLLOW UP AUGUST 2025  Provider:   Randene Bustard, MD

## 2024-06-03 ENCOUNTER — Ambulatory Visit: Attending: Student

## 2024-06-03 ENCOUNTER — Encounter: Payer: Self-pay | Admitting: Cardiology

## 2024-06-03 DIAGNOSIS — I471 Supraventricular tachycardia, unspecified: Secondary | ICD-10-CM

## 2024-06-03 DIAGNOSIS — R002 Palpitations: Secondary | ICD-10-CM

## 2024-06-03 DIAGNOSIS — I4819 Other persistent atrial fibrillation: Secondary | ICD-10-CM

## 2024-06-03 NOTE — Progress Notes (Unsigned)
 Patient enrolled for [employer] to ship a 7 day long term monitor with sensitive skin electrodes to her address on file.  Dr. Addie Holstein to read.

## 2024-06-04 ENCOUNTER — Ambulatory Visit: Attending: Student

## 2024-06-04 DIAGNOSIS — I4819 Other persistent atrial fibrillation: Secondary | ICD-10-CM

## 2024-06-04 DIAGNOSIS — R002 Palpitations: Secondary | ICD-10-CM

## 2024-06-04 DIAGNOSIS — I471 Supraventricular tachycardia, unspecified: Secondary | ICD-10-CM

## 2024-06-04 NOTE — Telephone Encounter (Signed)
 Please review and advise.

## 2024-06-04 NOTE — Telephone Encounter (Signed)
 Ok to only wear for 7 days?

## 2024-06-04 NOTE — Progress Notes (Unsigned)
 Enrolled patient for a 7 day Zio XT monitor to be mailed to patients home  Maria Meza to read

## 2024-06-08 NOTE — Telephone Encounter (Signed)
 Good to hear that are going up to the ablation.  Sorry that our team did not provide you the care you desired, and that you had to go to [facility].   Randene Bustard, MD

## 2024-06-10 NOTE — Telephone Encounter (Signed)
 Please advise if Zio will provide enough date or if patient also needs to wear the [employer]?

## 2024-06-11 NOTE — Telephone Encounter (Signed)
 RN spoke to patient. RN informed patient she only needs to wear the first monitor she received.  The second Zio monitor was sent in error.  The 3rd monitor from [employer] was sent because it was noted she had sensitive skin- the Last monitor is used with people who has sensitive skin.  RN informed patient to send the two  unused monitors back . She should not be charged per [employer] -Maria Meza.  Patient verbalized understanding. She states she will send them back.

## 2024-06-11 NOTE — Telephone Encounter (Signed)
 Left message to call back

## 2024-06-12 NOTE — Telephone Encounter (Signed)
 Great to see that this was able to be worked out.

## 2024-06-17 NOTE — Progress Notes (Unsigned)
 [facility]  PATIENT: Maria Meza DOB: 12/23/1956  REFERRING DOCTOR OR PCP: Norleen Hurst, MD; Ubaldo High, PA-C SOURCE: Patient, notes from primary care, notes from emergency room, imaging and lab reports, MRI images personally reviewed.  _________________________________   HISTORICAL  CHIEF COMPLAINT:  No chief complaint on file.   HISTORY OF PRESENT ILLNESS: I had the pleasure to see your patient, Maria Meza, at [facility] for neurologic consultation regarding her vertigo.  She is a 68 year old woman with a history of atrial fibrillation with rapid ventricular rate (on Eliquis ), OSA on CPAP, and hypothyroidism who presented to the [facility] emergency room on 05/26/2024 with positional vertigo.  She had an MRI of the brain that showed mild to moderate chronic microvascular ischemic changes but no acute findings.  The vestibulocochlear nerves appear normal.  EKG showed normal sinus rhythm and troponin was negative.  TSH was normal.  She was given IV fluids and the metoprolol  dose was reduced to 12.5 mg in the morning and 25 mg at night.  She represented to the [facility] emergency room on 05/27/2024 for recurrent positional dizziness.  She had some improvement with meclizine  and was discharged with a prescription for meclizine .      Imaging: MRI of the brain 05/26/2024 showed scattered T2/FLAIR hyperintense foci, predominantly in the subcortical and deep white matter of the frontal lobes.  These are consistent with mild to moderate chronic microvascular ischemic change.  None of these appear to be acute.  The vestibulocochlear nerves appears normal.  There are a couple mucous retention cyst in the paranasal sinuses but no significant findings there.  The arteries at the skull base have normal flow voids   REVIEW OF SYSTEMS: Constitutional: No fevers, chills, sweats, or change in appetite Eyes: No visual changes, double vision, eye pain Ear,  nose and throat: No hearing loss, ear pain, nasal congestion, sore throat Cardiovascular: No chest pain, palpitations Respiratory:  No shortness of breath at rest or with exertion.   No wheezes GastrointestinaI: No nausea, vomiting, diarrhea, abdominal pain, fecal incontinence Genitourinary:  No dysuria, urinary retention or frequency.  No nocturia. Musculoskeletal:  No neck pain, back pain Integumentary: No rash, pruritus, skin lesions Neurological: as above Psychiatric: No depression at this time.  No anxiety Endocrine: No palpitations, diaphoresis, change in appetite, change in weigh or increased thirst Hematologic/Lymphatic:  No anemia, purpura, petechiae. Allergic/Immunologic: No itchy/runny eyes, nasal congestion, recent allergic reactions, rashes  ALLERGIES: Allergies  Allergen Reactions   Amiodarone Other (See Comments)    does not tolerate--slows heart rate   Latex Other (See Comments)    blisters   Propafenone     Other Reaction(s): Other (See Comments)  Heart felt like it was going to stop; med did not work well.    Caused very low HR   Gluten Meal     Pt has celiac disease.    Hydrocodone Nausea And Vomiting   Propafenone Hcl     Caused very low HR   Verapamil      Fatigue, constipation    HOME MEDICATIONS:  Current Outpatient Medications:    apixaban  (ELIQUIS ) 5 MG TABS tablet, Take 1 tablet (5 mg total) by mouth 2 (two) times daily., Disp: , Rfl:    Ascorbic Acid  (VITAMIN C ) 1000 MG tablet, Take 1,000 mg by mouth daily., Disp: , Rfl:    b complex vitamins tablet, Take by mouth daily., Disp: , Rfl:    cholecalciferol (VITAMIN D3) 25 MCG (1000  UNIT) tablet, Take 400 mcg by mouth daily., Disp: , Rfl:    folic acid  (FOLVITE ) 1 MG tablet, Take 2 mg by mouth daily. 2 tablets daily, Disp: , Rfl:    latanoprost  (XALATAN ) 0.005 % ophthalmic solution, Place 1 drop into both eyes daily. , Disp: , Rfl:    levothyroxine  (SYNTHROID ) 137 MCG tablet, Take 137 mcg by mouth  daily., Disp: , Rfl:    magnesium  oxide (MAG-OX) 400 MG tablet, TAKE 1 TABLET BY MOUTH TWICE A DAY, Disp: 180 tablet, Rfl: 3   meclizine  (ANTIVERT ) 25 MG tablet, Take 0.5 tablets (12.5 mg total) by mouth 3 (three) times daily as needed for dizziness. (Patient not taking: Reported on 05/30/2024), Disp: 30 tablet, Rfl: 0   metoprolol  tartrate (LOPRESSOR ) 25 MG tablet, Take 1/2 tablet (12.5mg ) in the morning and 1 tablet (25mg  daily) in the evening., Disp: , Rfl:    pantoprazole  (PROTONIX ) 40 MG tablet, Take 1 tablet (40 mg total) by mouth daily., Disp: 30 tablet, Rfl: 11  PAST MEDICAL HISTORY: Past Medical History:  Diagnosis Date   Absolute anemia    No longer on iron supplementation.   Adult celiac disease 2018   Anxiety    Arrhythmia 06/2016   Event monitor has shown PVCs, PSVT at 1 short burst of NSVT.;  Event monitor from March 2021: 6 short runs of wide-complex tachycardia and 25 runs of PSVT--intolerant of high-dose beta-blocker because of bradycardia.   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  (ANA positive, dsDNA negative)   Dysmenorrhea    Dysrhythmia    Elevated LFTs    Fatty liver    GERD (gastroesophageal reflux disease)    Glaucoma    History of COVID-19 02/19/2020   Hypothyroidism    IBS (irritable bowel syndrome)    Morbid obesity with BMI of 40.0-44.9, adult (HCC) 09/2020   BMI 43.07   MRSA infection 4-end-21   on abdomen   OSA on CPAP    Pulmonary hypertension (HCC)    PVC's (premature ventricular contractions)    Thyroid  disease    hypothyroidism   TIA (transient ischemic attack)    2 per patient   Urinary incontinence    with sneezing, coughing    PAST SURGICAL HISTORY: Past Surgical History:  Procedure Laterality  Date   BIOPSY  11/01/2023   Procedure: BIOPSY;  Surgeon: Shaaron Lamar HERO, MD;  Location: [facility];  Service: Endoscopy;;   CARDIAC EVENT MONITOR  06/2016   [facility]) Noted PVCs, PSVT and NSVT (1 episode of 20 beats). ->  PT evaluation suggested PVCs appear to have been completely interpolated.  Felt to be septal   CARDIAC MRI -ADENOSINE / STRESS  05/28/2020   [facility]): EF 69%. Normal LV size, thickness and function w/ NO RWMA  CO & CI 6.5 L/min & 3 L/min.      Normal RV size thickness and function.  No RV WMA or aneurysm.  No findings c/w  ARVC.  Both atria are mildly enlarged.  Trileaflet aortic valve with no evidence of stenosis.  Adenosine Stress Imaging: no  inducible myocardial ischemia OR prior infarction/scar or infiltrate. Normal AoV. Mid TR.   CARDIOVERSION N/A 09/14/2022   Procedure: CARDIOVERSION;  Surgeon: Jeffrie Oneil BROCKS, MD;  Location: [facility] ORS;  Service: Cardiovascular;  Laterality: N/A;   CARDIOVERSION N/A 05/02/2024   Procedure: CARDIOVERSION;  Surgeon: Stacia Diannah SQUIBB, MD;  Location: [facility];  Service: Cardiovascular;  Laterality: N/A;   CATARACT EXTRACTION Right 03/2018   CATARACT EXTRACTION Left 03/2018   CHOLECYSTECTOMY     ESOPHAGOGASTRODUODENOSCOPY (EGD) WITH PROPOFOL  N/A 11/01/2023   Procedure: ESOPHAGOGASTRODUODENOSCOPY (EGD) WITH PROPOFOL ;  Surgeon: Shaaron Lamar HERO, MD;  Location: [facility];  Service: Endoscopy;  Laterality: N/A;  1245PM, ASA 3   HYSTEROSCOPY WITH D & C N/A 04/29/2024   Procedure: DILATATION AND CURETTAGE LELDON NAIL;  Surgeon: Dallie Vera GAILS, MD;  Location: [facility] OR;  Service: Gynecology;  Laterality: N/A;  Myosure   LAPAROSCOPIC TUBAL LIGATION  1986   NUCLEAR STRESS TEST  05/2014   EF 55%.  Mixed anteroseptal defect consistent with breast attenuation artifact.   TEE WITHOUT CARDIOVERSION N/A 09/14/2022   Procedure: TRANSESOPHAGEAL ECHOCARDIOGRAM (TEE);  Surgeon: Jeffrie Oneil BROCKS, MD;  Location: [facility];  Service: Cardiovascular;  Laterality: N/A;    TEE WITHOUT CARDIOVERSION N/A 05/02/2024   Procedure: ECHOCARDIOGRAM, TRANSESOPHAGEAL;  Surgeon: Stacia Diannah SQUIBB, MD;  Location: [facility];  Service: Cardiovascular;  Laterality: N/A;   TRANSTHORACIC ECHOCARDIOGRAM  08/2016   [facility]- Normal LV size and function-EF~55%.  No RWMA..  Normal LA pressures.  Normal RV function.  Trivial AR, PR and TR.  No stenoses.    TRANSTHORACIC ECHOCARDIOGRAM  02/14/2022   Difficult images.  Normal EF 60 to 65%.  No RWMA.  Normal diastolic parameters.  Normal RV size and function.  Mild to moderate LA dilation.  Normal aortic and mitral valves.  Acing aorta measured roughly 38 mm.   ZIO PATCH CARDIAC EVENT MONITOR  02/2020   Predom Rhytym: SR - min 46 - max 113 bpm, avg 64 bpm. Rare isolated PVC & PACs. 6 short NSVT runs (8-11 beats, 134-245 bpm); 25 runs of  PAT/SVT - fastest 9 beats (200 bpm), longest 10.6 sec (130 bpm). Noted Sx w/ PAT & NSVT during daylight hours, and only once at midnight.   ZIO PATCH MONITOR  01/2022   Predominant SR: Rate range 46-99 bpm, avg 61 bpm.  Rare PACs & PVCs (& rare couplets).  No bigeminy/trigeminy.  (Sx noted w/ PACs and PVCs - & a few Atrial Runs).  35 Atrial Runs + 1 V. tach run of 4 Narrow Complex beats; fastest atrial run 5 beats @ 193 bpm & longest 22 beats (12.1 sec) @ avg 108 bpm.    FAMILY HISTORY: Family History  Problem Relation Age of Onset   Heart attack Father        dec age 24   Hypertension Father    Hypertension Mother    Hyperlipidemia Mother    Migraines Mother    Seizures Mother        epilepsy   Stroke Mother        TIA's   Thyroid  disease Sister        hypothyroid   Thyroid  disease Sister        hypothyroid   Rheum arthritis Maternal Grandmother    Migraines Maternal Grandmother    Cancer Maternal Grandfather 63       colon ca--DEC age 74   Diabetes Maternal Grandfather    Stroke Paternal Grandmother        multiple   Thyroid  disease Paternal Grandmother        goiter-hypothyroid   Breast  cancer Maternal Aunt  SOCIAL HISTORY: Social History   Socioeconomic History   Marital status: Married    Spouse name: Not on file   Number of children: Not on file   Years of education: Not on file   Highest education level: Not on file  Occupational History   Not on file  Tobacco Use   Smoking status: Never   Smokeless tobacco: Never   Tobacco comments:    Never smoke 11/28/22  Vaping Use   Vaping status: Never Used  Substance and Sexual Activity   Alcohol use: No    Alcohol/week: 0.0 standard drinks of alcohol   Drug use: No   Sexual activity: Not Currently    Partners: Male    Birth control/protection: Post-menopausal    Comment: less than 5, after IC, no hx of STD, no abnormal pap, no DES  Other Topics Concern   Not on file  Social History Narrative   She lives in [place], [place]   Right handed    Caffeine use: 1 cup coffee every morning   Social Drivers of Health   Financial Resource Strain: Not on file  Food Insecurity: No Food Insecurity (05/01/2024)   Hunger Vital Sign    Worried About Running Out of Food in the Last Year: Never true    Ran Out of Food in the Last Year: Never true  Transportation Needs: No Transportation Needs (05/01/2024)   PRAPARE - Administrator, Civil Service (Medical): No    Lack of Transportation (Non-Medical): No  Physical Activity: Not on file  Stress: Not on file  Social Connections: Socially Integrated (05/01/2024)   Social Connection and Isolation Panel    Frequency of Communication with Friends and Family: More than three times a week    Frequency of Social Gatherings with Friends and Family: More than three times a week    Attends Religious Services: More than 4 times per year    Active Member of [employer] or Organizations: Yes    Attends Engineer, structural: More than 4 times per year    Marital Status: Married  Catering manager Violence: Not At Risk (05/01/2024)   Humiliation, Afraid, Rape, and Kick  questionnaire    Fear of Current or Ex-Partner: No    Emotionally Abused: No    Physically Abused: No    Sexually Abused: No       PHYSICAL EXAM  There were no vitals filed for this visit.  There is no height or weight on file to calculate BMI.   General: The patient is well-developed and well-nourished and in no acute distress  HEENT:  Head is [place]/AT.  Sclera are anicteric.  Funduscopic exam shows normal optic discs and retinal vessels.  Neck: No carotid bruits are noted.  The neck is nontender.  Cardiovascular: The heart has a regular rate and rhythm with a normal S1 and S2. There were no murmurs, gallops or rubs.    Skin: Extremities are without rash or  edema.  Musculoskeletal:  Back is nontender  Neurologic Exam  Mental status: The patient is alert and oriented x 3 at the time of the examination. The patient has apparent normal recent and remote memory, with an apparently normal attention span and concentration ability.   Speech is normal.  Cranial nerves: Extraocular movements are full. Pupils are equal, round, and reactive to light and accomodation.  Visual fields are full.  Facial symmetry is present. There is good facial sensation to soft touch bilaterally.Facial strength is normal.  Trapezius and sternocleidomastoid strength is normal. No dysarthria is noted.  The tongue is midline, and the patient has symmetric elevation of the soft palate. No obvious hearing deficits are noted.  Motor:  Muscle bulk is normal.   Tone is normal. Strength is  5 / 5 in all 4 extremities.   Sensory: Sensory testing is intact to pinprick, soft touch and vibration sensation in all 4 extremities.  Coordination: Cerebellar testing reveals good finger-nose-finger and heel-to-shin bilaterally.  Gait and station: Station is normal.   Gait is normal. Tandem gait is normal. Romberg is negative.   Reflexes: Deep tendon reflexes are symmetric and normal bilaterally.   Plantar responses are  flexor.    DIAGNOSTIC DATA (LABS, IMAGING, TESTING) - I reviewed patient records, labs, notes, testing and imaging myself where available.  Lab Results  Component Value Date   WBC 9.6 05/27/2024   HGB 13.2 05/27/2024   HCT 39.7 05/27/2024   MCV 94.3 05/27/2024   PLT 320 05/27/2024      Component Value Date/Time   NA 142 05/27/2024 2245   NA 143 05/15/2024 0820   K 4.3 05/27/2024 2245   CL 107 05/27/2024 2245   CO2 24 05/27/2024 2245   GLUCOSE 98 05/27/2024 2245   BUN 14 05/27/2024 2245   BUN 17 05/15/2024 0820   CREATININE 0.98 05/27/2024 2245   CALCIUM 9.7 05/27/2024 2245   PROT 6.6 05/15/2024 0820   ALBUMIN 4.4 05/15/2024 0820   AST 22 05/15/2024 0820   ALT 32 05/15/2024 0820   ALKPHOS 97 05/15/2024 0820   BILITOT 0.5 05/15/2024 0820   GFRNONAA >60 05/27/2024 2245   No results found for: CHOL, HDL, LDLCALC, LDLDIRECT, TRIG, CHOLHDL No results found for: YHAJ8R No results found for: VITAMINB12 Lab Results  Component Value Date   TSH 2.084 05/26/2024       ASSESSMENT AND PLAN  ***   Gerilynn Mccullars A. Vear, MD, [facility] 06/17/2024, 3:56 PM Certified in Neurology, Clinical Neurophysiology, Sleep Medicine and Neuroimaging  [facility] [address], [address] [place], [place] [address] [phone]

## 2024-06-18 ENCOUNTER — Encounter: Payer: Self-pay | Admitting: Neurology

## 2024-06-18 ENCOUNTER — Ambulatory Visit (INDEPENDENT_AMBULATORY_CARE_PROVIDER_SITE_OTHER): Admitting: Neurology

## 2024-06-18 VITALS — BP 137/72 | HR 72 | Ht 65.0 in | Wt 257.8 lb

## 2024-06-18 DIAGNOSIS — R42 Dizziness and giddiness: Secondary | ICD-10-CM

## 2024-06-18 DIAGNOSIS — H8112 Benign paroxysmal vertigo, left ear: Secondary | ICD-10-CM

## 2024-06-18 DIAGNOSIS — I4891 Unspecified atrial fibrillation: Secondary | ICD-10-CM | POA: Diagnosis not present

## 2024-06-20 HISTORY — PX: ATRIAL FIBRILLATION ABLATION: EP1191

## 2024-06-26 ENCOUNTER — Ambulatory Visit: Admitting: Neurology

## 2024-07-02 NOTE — Telephone Encounter (Signed)
 Maria Meza! Dr. Anner will provide the formal read of the monitor, but I looked at the preliminary read (which is usually the same) and it showed 10 short episodes of SVT (the longest episode was 20 beats) and otherwise only rare premature beats. Her triggered symptomatic events did seem to coincide with the episodes of SVT.  However, I think she had an ablation at [facility] on 06/20/2024 so I would not change anything at this point.  Thanks so much! Brice Potteiger

## 2024-07-03 DIAGNOSIS — R002 Palpitations: Secondary | ICD-10-CM | POA: Diagnosis not present

## 2024-07-04 ENCOUNTER — Ambulatory Visit: Payer: Self-pay | Admitting: Student

## 2024-07-17 ENCOUNTER — Ambulatory Visit: Admitting: Neurology

## 2024-07-23 ENCOUNTER — Other Ambulatory Visit: Payer: Self-pay

## 2024-07-23 DIAGNOSIS — R7989 Other specified abnormal findings of blood chemistry: Secondary | ICD-10-CM

## 2024-07-23 DIAGNOSIS — K760 Fatty (change of) liver, not elsewhere classified: Secondary | ICD-10-CM

## 2024-08-08 LAB — HEPATIC FUNCTION PANEL
ALT: 33 IU/L — ABNORMAL HIGH (ref 0–32)
AST: 25 IU/L (ref 0–40)
Albumin: 4.5 g/dL (ref 3.9–4.9)
Alkaline Phosphatase: 90 IU/L (ref 44–121)
Bilirubin Total: 0.3 mg/dL (ref 0.0–1.2)
Bilirubin, Direct: 0.15 mg/dL (ref 0.00–0.40)
Total Protein: 6.8 g/dL (ref 6.0–8.5)

## 2024-08-12 ENCOUNTER — Ambulatory Visit: Attending: Cardiology | Admitting: Cardiology

## 2024-08-12 VITALS — BP 120/68 | HR 54 | Ht 65.0 in | Wt 255.3 lb

## 2024-08-12 DIAGNOSIS — I471 Supraventricular tachycardia, unspecified: Secondary | ICD-10-CM | POA: Diagnosis not present

## 2024-08-12 DIAGNOSIS — E785 Hyperlipidemia, unspecified: Secondary | ICD-10-CM | POA: Insufficient documentation

## 2024-08-12 DIAGNOSIS — I4819 Other persistent atrial fibrillation: Secondary | ICD-10-CM | POA: Insufficient documentation

## 2024-08-12 DIAGNOSIS — G4733 Obstructive sleep apnea (adult) (pediatric): Secondary | ICD-10-CM | POA: Diagnosis not present

## 2024-08-12 DIAGNOSIS — R42 Dizziness and giddiness: Secondary | ICD-10-CM | POA: Diagnosis present

## 2024-08-12 DIAGNOSIS — E66813 Obesity, class 3: Secondary | ICD-10-CM | POA: Insufficient documentation

## 2024-08-12 DIAGNOSIS — D6869 Other thrombophilia: Secondary | ICD-10-CM | POA: Diagnosis present

## 2024-08-12 NOTE — Patient Instructions (Signed)
Medication Instructions:  ?No changes ? ? ?*If you need a refill on your cardiac medications before your next appointment, please call your pharmacy* ? ? ?Lab Work: ?Not needed ? ? ? ?Testing/Procedures: ?Not needed ? ? ?Follow-Up: ?At [facility], you and your health needs are our priority.  As part of our continuing mission to provide you with exceptional heart care, we have created designated Provider Care Teams.  These Care Teams include your primary Cardiologist (physician) and Advanced Practice Providers (APPs -  Physician Assistants and Nurse Practitioners) who all work together to provide you with the care you need, when you need it. ? ?  ? ?Your next appointment:   ?6 month(s) ? ?The format for your next appointment:   ?In Person ? ?Provider:   ?Sande Rives, PA-C    Then, Glenetta Hew, MD will plan to see you again in 12 month(s). ? ? ? ?

## 2024-08-12 NOTE — Progress Notes (Unsigned)
 Cardiology Office Note:  .   Date:  08/13/2024  ID:  Maria Meza, DOB October 04, 1956, MRN [id number] PCP: Maria Norleen PEDLAR, MD  Maria Meza Providers Cardiologist:  Maria Clay, MD Cardiology APP:  Maria Maria BRAVO, PA-C  Electrophysiologist:  Maria Meza Furbish, MD    EP: Dr. Evalene Meza  No chief complaint on file.   Patient Profile: .     Maria Meza is an obese 68 y.o. female with a PMH noted below who presents here for 53-month follow-up at the request of Maria Norleen PEDLAR, MD.  Maria Meza is an obese 68 y.o. female with a PMH notable for OSA (on CPAP), SVT, TIA, hypothyroidism and relatively recently diagnosed PAF(persistent) who presents here for 8 month f/u at the request of Maria Golas, MD.  Cardiac History: History of SVT and PVCs-EP study years ago with no ablation A-fib diagnosed in September 2023 (lightheadedness dizziness palpitations with mild dyspnea) = Persistent Started on Eliquis  (CHA2DS2-VASc score 6: HTN, stroke (2), vascular history, age, gender) TEE DCCV (08/2022) 4 weeks after => back in A-fib-ER 10/30/2022 with redo DCCV. Initial plans for ablation in late 2023 canceled/delayed due to thyroid  issues. =>  PVI 05/2024 [facility])  Symptomatic PVCs OSA on CPAP Obesity Hypothyroidism - on Synthroid     Maria Meza was admitted 5/7-07/2024 for recurrent atrial fibrillation underwent TEE DCCV.  TEE showed EF of 60 to 65% mild MR.  She was seen in A-fib clinic on May 16-maintaining sinus rhythm.  Lopressor  dose reduced due to bradycardia and fatigue.  Referred to Dr. Furbish to discuss A-fib ablation.  She subsequently sought out second opinion at [facility] Card[facility] EP-seen on May 30-A-fib ablation recommended.  She was seen in the ER for dizziness and orthostatic hypotension on both June 1 and June 2.  On the second occasion was thought to be related to vertigo and she was referred to neurology.    She was seen by Maria Jadine, PA on June 5  and was feeling better by that time.  Subjective  Discussed the use of AI scribe software for clinical note transcription with the patient, who gave verbal consent to proceed.  History of Present Illness  Maria Meza is a 68 year old female with atrial fibrillation post-ablation (PVI) who presents for follow-up regarding her heart rhythm management.  She is currently in her eighth week post-ablation for atrial fibrillation and experiences short bursts of supraventricular tachycardia, with the longest episode lasting 20 beats. She is on Eliquis  and continues to take metoprolol , 12.5 mg, half in the morning and a whole one at night. She is scheduled for a heart monitor test on September 14th for 14 days, followed by a video call to discuss the results.  There is improvement in her ability to perform physical activities without triggering atrial fibrillation, such as working around the house and walking down steps. Previously, she experienced dizziness and shortness of breath, which have since resolved. She no longer uses meclizine  as her vertigo has subsided. She maintains adequate hydration and has no symptoms of stroke, chest pain, or leg swelling.  Her Synthroid  dosage was last checked about a month and a half ago and was reported to be doing okay. She is not due for another check until December. Her LDL cholesterol was noted to be 111 in June, and she is not currently on medication for it. She hopes to increase her physical activity to help manage her cholesterol levels.  In  the review of symptoms, she has no shortness of breath when lying flat, swelling in her legs, chest tightness, or pain. She acknowledges occasional 'little pressures' during flutters but notes they do not last long. She has no bleeding issues, blood in stool or urine, headaches, or visual disturbances. She is gradually increasing her physical activity, weather permitting, and plans to find indoor locations to walk  when it is too hot outside.     Objective   Medications - Eliquis  5 mg twice daily - Metoprolol  25 mg: 1/2 tab (12.5 mg) in the morning and a 25 mg at night - Synthroid  137 mcg daily - Antivert  25 mg: 1/2 tab 3 times daily as needed dizzy - Protonix  40 mg nightly - Folvite  2 mg (2 tab) daily - B complex vitamin, vitamin C  1000 g daily  Studies Reviewed: SABRA   EKG Interpretation Date/Time:  Monday August 12 2024 08:31:38 EDT Ventricular Rate:  54 PR Interval:  150 QRS Duration:  142 QT Interval:  438 QTC Calculation: 415 R Axis:   -54  Text Interpretation: Sinus bradycardia Left axis deviation Right bundle branch block When compared with ECG of 27-May-2024 22:34, T wave inversion no longer evident in Anterior leads Confirmed by Anner Lenis ([id number]) on 08/12/2024 8:52:26 AM    Labs:  Lab Results  Component Value Date   NA 142 05/27/2024   K 4.3 05/27/2024   CREATININE 0.98 05/27/2024   GFRNONAA >60 05/27/2024   GLUCOSE 98 05/27/2024   Lipids: TC 187, TG 131, HDL 52, LDL 111; A1c 5.4, Cr 0.91 (06/06/2024)  No results found for: CHOL, HDL, LDLCALC, LDLDIRECT, TRIG, CHOLHDL Lab Results  Component Value Date   TSH 2.084 05/26/2024    TEE/DCCV 05/02/2024: EF 60 to 65%.  No RWMA.  No LAA thrombus. (Atrial Fibrillation)-PVI June 20, 2024 [facility]) 7-day Zio patch (July 2025) Predominant rhythm is sinus rhythm with a rate range of 43 to 114 bpm with an average of 60 bpm.   Rare PACs and PVCs noted (<1%) with couplets and triplets.  No bigeminy or trigeminy.   10 atrial runs (read as supraventricular tachycardia) with the fastest being 20 beats at a max rate of 152 bpm that was also the longest.   2 atrial runs, mostly sinus rhythm with PACs and PVCs noted on patient triggered events   No Sustained Arrhythmias: Atrial Tachycardia (AT), Supraventricular Tachycardia (SVT), Atrial Fibrillation (A-Fib), Atrial Flutter (A-Flutter), Sustained Ventricular Tachycardia  (VT)  Risk Assessment/Calculations:    CHA2DS2-VASc Score = 6   This indicates a 9.7% annual risk of stroke. The patient's score is based upon: CHF History: 0 HTN History: 1 Diabetes History: 0 Stroke History: 2 Vascular Disease History: 1 Age Score: 1 Gender Score: 1             Physical Exam:   VS:  BP 120/68   Pulse (!) 54   Ht 5' 5 (1.651 m)   Wt 255 lb 4.8 oz (115.8 kg)   LMP 04/16/2016 (Exact Date)   SpO2 95%   BMI 42.48 kg/m    Wt Readings from Last 3 Encounters:  08/12/24 255 lb 4.8 oz (115.8 kg)  06/18/24 257 lb 12.8 oz (116.9 kg)  05/30/24 255 lb (115.7 kg)    GEN: Well nourished, well groomed in no acute distress; morbidly obese NECK: No JVD; No carotid bruits CARDIAC: Normal S1, S2; bradycardic with normal rhythm;  no murmurs, rubs, gallops RESPIRATORY:  Clear to auscultation without rales, wheezing or  rhonchi ; nonlabored, good air movement. ABDOMEN: Soft, non-tender, non-distended EXTREMITIES:  No edema; No deformity      ASSESSMENT AND PLAN: .    Problem List Items Addressed This Visit       Cardiology Problems   Hypercoagulable state due to persistent atrial fibrillation (HCC) (Chronic)   Remains on Eliquis  5 mg twice daily.  Hopefully if the ablation/PVI proves to be successful she may potentially come off the DOAC.  -For now, would be okay to hold for 2 to 3 days preop for surgeries or procedures.      Relevant Orders   EKG 12-Lead (Completed)   Hyperlipidemia LDL goal <100   Simply based on risk factors, would like to see LDL less than 100.  She is not currently on medications.  Continue to follow-up with PCP.  However once she gets through the initial recovery.  Post A-fib ablation, we can start considering lipid management.      Persistent atrial fibrillation (HCC) - Primary (Chronic)   Eight weeks Post-Ablation/PVI with short bursts of supraventricular tachycardia/atrial runs, longest being 20 beats.  No significant atrial  fibrillation episodes. Reports improvement in symptoms and ability to exercise without triggering AFib. - Continue Eliquis  5 mg twice daily for anticoagulation => defer to EP for timing of when this can be stopped. - Continue metoprolol  12.5 mg in the morning and 25 mg at night. - Instruct to take an extra half dose of metoprolol  if flutters last longer than a few minutes. - Per EP, plan is re- look Heart Monitor Test on September 14 for 14 days; followed by Video visit with the ablation doctor after heart monitor results. - Ensure adequate hydration and monitor for any stroke-like symptoms.      Relevant Orders   EKG 12-Lead (Completed)   PSVT (paroxysmal supraventricular tachycardia) (HCC) (Chronic)   Little bursts noted on monitor, she feels fluttering but nothing prolonged.  Pretty well-controlled on low-dose Lopressor  which she takes about half dose in the morning and full dose in the evening.  She can take an additional half dose for worsening palpitations.        Other   Episodic lightheadedness (Chronic)   Not likely related to PAT/SVT because her not long enough spells that she is having on the monitor..  Also not likely related RVEF/bradycardia. Probably combination of palpitations and potentially dehydration.      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      OSA on CPAP (Chronic)   Continue to recommend CPAP use.  Likely associated with A-fib.      Relevant Orders   EKG 12-Lead (Completed)            Follow-Up: Return in about 6 months (around 02/12/2025) for Alternate 6 month follow-up with APP & MD.     Signed, Maria MICAEL Clay, MD, MS Maria Meza, M.D., M.S. Interventional [employer] certified accountant  Pager # [phone]

## 2024-08-13 ENCOUNTER — Encounter: Payer: Self-pay | Admitting: Cardiology

## 2024-08-13 DIAGNOSIS — E785 Hyperlipidemia, unspecified: Secondary | ICD-10-CM | POA: Insufficient documentation

## 2024-08-13 NOTE — Assessment & Plan Note (Signed)
 Remains on Eliquis  5 mg twice daily.  Hopefully if the ablation/PVI proves to be successful she may potentially come off the DOAC.  -For now, would be okay to hold for 2 to 3 days preop for surgeries or procedures.

## 2024-08-13 NOTE — Assessment & Plan Note (Signed)
 Not likely related to PAT/SVT because her not long enough spells that she is having on the monitor..  Also not likely related RVEF/bradycardia. Probably combination of palpitations and potentially dehydration.

## 2024-08-13 NOTE — Assessment & Plan Note (Signed)
 Little bursts noted on monitor, she feels fluttering but nothing prolonged.  Pretty well-controlled on low-dose Lopressor  which she takes about half dose in the morning and full dose in the evening.  She can take an additional half dose for worsening palpitations.

## 2024-08-13 NOTE — Assessment & Plan Note (Signed)
 Simply based on risk factors, would like to see LDL less than 100.  She is not currently on medications.  Continue to follow-up with PCP.  However once she gets through the initial recovery.  Post A-fib ablation, we can start considering lipid management.

## 2024-08-13 NOTE — Assessment & Plan Note (Signed)
 Hopefully now that her A-fib ablation is completed she can get back to her adjusted lifestyle with getting back to more exercise.

## 2024-08-13 NOTE — Assessment & Plan Note (Signed)
 Eight weeks Post-Ablation/PVI with short bursts of supraventricular tachycardia/atrial runs, longest being 20 beats.  No significant atrial fibrillation episodes. Reports improvement in symptoms and ability to exercise without triggering AFib. - Continue Eliquis  5 mg twice daily for anticoagulation => defer to EP for timing of when this can be stopped. - Continue metoprolol  12.5 mg in the morning and 25 mg at night. - Instruct to take an extra half dose of metoprolol  if flutters last longer than a few minutes. - Per EP, plan is re- look Heart Monitor Test on September 14 for 14 days; followed by Video visit with the ablation doctor after heart monitor results. - Ensure adequate hydration and monitor for any stroke-like symptoms.

## 2024-08-13 NOTE — Assessment & Plan Note (Signed)
 Continue to recommend CPAP use.  Likely associated with A-fib.

## 2024-08-25 ENCOUNTER — Ambulatory Visit: Payer: Self-pay | Admitting: Internal Medicine

## 2024-09-03 ENCOUNTER — Other Ambulatory Visit: Payer: Self-pay | Admitting: Obstetrics and Gynecology

## 2024-09-03 DIAGNOSIS — Z1231 Encounter for screening mammogram for malignant neoplasm of breast: Secondary | ICD-10-CM

## 2024-10-18 ENCOUNTER — Ambulatory Visit

## 2024-10-24 ENCOUNTER — Ambulatory Visit

## 2024-10-30 ENCOUNTER — Ambulatory Visit
Admission: RE | Admit: 2024-10-30 | Discharge: 2024-10-30 | Disposition: A | Discharge status: Home / Self Care | Source: Ambulatory Visit | Attending: Obstetrics and Gynecology | Admitting: Obstetrics and Gynecology

## 2024-10-30 DIAGNOSIS — Z1231 Encounter for screening mammogram for malignant neoplasm of breast: Secondary | ICD-10-CM

## 2024-11-01 ENCOUNTER — Ambulatory Visit: Payer: Self-pay | Admitting: Obstetrics and Gynecology

## 2024-11-19 ENCOUNTER — Encounter: Payer: Self-pay | Admitting: Internal Medicine

## 2024-12-07 LAB — LAB REPORT - SCANNED
A1c: 5.6
Albumin, Urine POC: 6.7
Albumin/Creatinine Ratio, Urine, POC: 4
Creatinine, POC: 153.2 mg/dL
EGFR: 72

## 2025-01-06 ENCOUNTER — Encounter: Admitting: Obstetrics and Gynecology

## 2025-01-10 ENCOUNTER — Encounter: Admitting: Obstetrics and Gynecology

## 2025-01-29 ENCOUNTER — Encounter: Payer: Self-pay | Admitting: Gastroenterology

## 2025-01-29 ENCOUNTER — Ambulatory Visit (INDEPENDENT_AMBULATORY_CARE_PROVIDER_SITE_OTHER): Admitting: Gastroenterology

## 2025-01-29 VITALS — BP 134/71 | HR 62 | Temp 98.2°F | Ht 65.0 in | Wt 261.6 lb

## 2025-01-29 DIAGNOSIS — K2100 Gastro-esophageal reflux disease with esophagitis, without bleeding: Secondary | ICD-10-CM | POA: Diagnosis not present

## 2025-01-29 DIAGNOSIS — K760 Fatty (change of) liver, not elsewhere classified: Secondary | ICD-10-CM

## 2025-01-29 DIAGNOSIS — K900 Celiac disease: Secondary | ICD-10-CM | POA: Diagnosis not present

## 2025-01-29 DIAGNOSIS — K219 Gastro-esophageal reflux disease without esophagitis: Secondary | ICD-10-CM

## 2025-01-29 NOTE — Patient Instructions (Addendum)
 Continue gluten free, low carb, low fat diet for management of your celiac disease and fatty liver.   Decrease your folic acid  to 1mg  daily.  Add daily zinc  supplement, use the daily recommended allowance per package instructions.  Add calcium 600mg  daily. Make sure to not use additional vitamin D  unless you hold your separate vit D supplement.   We will update your labs in July and see you back in the office.   You are due colonoscopy this year, we will plan to schedule after next office visit unless you choose to have sooner.   Return to the office in July.

## 2025-01-29 NOTE — Progress Notes (Signed)
 "    GI Office Note    Referring Provider: Shona Norleen PEDLAR, MD Primary Care Physician:  Shona Norleen PEDLAR, MD  Primary Gastroenterologist: Ozell Hollingshead, MD   Chief Complaint   Chief Complaint  Patient presents with   Follow-up    Doing well, no issues.    History of Present Illness   Maria Meza is a 69 y.o. female presenting today for follow-up.  Last seen April 2025.  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  Discussed the use of AI scribe software for clinical note transcription with the patient, who gave verbal consent to proceed.  History of Present Illness Maria Meza is a 69 year old female with celiac disease, MASLD, and erosive reflux esophagitis who presents for routine follow-up.  Celiac disease is well controlled with a strict gluten-free diet with regular bowel movements. Accidental gluten exposure causes mild, recognizable symptoms. She takes vitamin D  400 IU daily, vitamin C , vitamin B complex, folic acid  2 mg daily, and magnesium  oxide. She does not take a multivitamin or calcium. She is interested in checking mineral levels, including zinc  and selenium, given prior perceived benefit from zinc .  She has MASLD with annual labs, most recently in December 2025. She follows a low fat, low carbohydrate diet and avoids alcohol. She does not have diabetes and has no symptoms of liver disease.  Her prior erosive reflux esophagitis is asymptomatic off acid suppression while she maintains a gluten-free diet.  She had a colonoscopy 9 years ago in [place] with a 10-year follow-up interval recommended, due this year. She has noted no change in stool. No constipation, diarrhea, melena, brbpr. No abdominal pain. No dysphagia.      Prior Data     Results   Labs dated 12/06/2024: Vitamin D  42.8, A1c 5.6, LDL 113, creatinine 0.87, total protein 6, albumin 4.4, total bilirubin 0.5, alk  phos 90, AST 26, ALT 31, sodium 141, white blood cell count 7.3, hemoglobin 13.7, platelets 306, TSH 1.450, free T4 1.43.  Abdominal ultrasound with elastography May 2025: IMPRESSION: ULTRASOUND ABDOMEN: Increased echotexture which can be seen in fatty infiltration of liver. Prior cholecystectomy. ULTRASOUND HEPATIC ELASTOGRAPHY: Median kPa:  4.3 (IQR/Median kPa ratio of 0.89 (greater than 0.6 indicates reduced accuracy) Diagnostic category:  < or = 5 kPa: high probability of being normal  EGD in November 2024: -Mild erosive reflux esophagitis.  Noncritical Schatzki ring not manipulated -Small hiatal hernia.  Abnormal gastric mucosa of unclear significance status post biopsy, reactive gastropathy with mild chronic gastritis.  H. pylori negative. - Prominent ampulla.   -Subtle scalloping of duodenal fold tips.  Biopsy with reactive duodenal mucosa.  Negative for intraepithelial lymphocytosis. -Epigastric/right upper quadrant pain may be due to reflux component, resolved on PPI    Medications   Current Outpatient Medications  Medication Sig Dispense Refill   apixaban  (ELIQUIS ) 5 MG TABS tablet Take 1 tablet (5 mg total) by mouth 2 (two) times daily.     Ascorbic Acid  (VITAMIN C ) 1000 MG tablet Take 1,000 mg by mouth daily.     b complex vitamins tablet Take by mouth daily.     cholecalciferol (VITAMIN D3) 25 MCG (1000 UNIT) tablet Take 400 mcg by mouth daily.     folic acid  (FOLVITE ) 1 MG tablet Take 2 mg by mouth daily. 2 tablets daily     latanoprost  (XALATAN ) 0.005 % ophthalmic solution Place 1 drop into  both eyes daily.      levothyroxine  (SYNTHROID ) 137 MCG tablet Take 137 mcg by mouth daily.     magnesium  oxide (MAG-OX) 400 MG tablet TAKE 1 TABLET BY MOUTH TWICE A DAY 180 tablet 3   meclizine  (ANTIVERT ) 25 MG tablet Take 0.5 tablets (12.5 mg total) by mouth 3 (three) times daily as needed for dizziness. 30 tablet 0   metoprolol  tartrate (LOPRESSOR ) 25 MG tablet Take 1/2 tablet  (12.5mg ) in the morning and 1 tablet (25mg  daily) in the evening.     pantoprazole  (PROTONIX ) 40 MG tablet Take 1 tablet (40 mg total) by mouth daily. 30 tablet 11   rosuvastatin (CRESTOR) 5 MG tablet Take 5 mg by mouth daily. (Patient not taking: Reported on 01/29/2025)     No current facility-administered medications for this visit.    Allergies   Allergies as of 01/29/2025 - Review Complete 01/29/2025  Allergen Reaction Noted   Amiodarone Other (See Comments) 12/11/2014   Latex Other (See Comments) 09/09/2022   Propafenone  05/10/2017   Codeine  08/02/2010   Gluten meal  08/01/2019   Hydrocodone Nausea And Vomiting 11/24/2014   Propafenone hcl  09/29/2017   Verapamil   10/11/2020     Past Medical History   Past Medical History:  Diagnosis Date   Absolute anemia    No longer on iron supplementation.   Adult celiac disease 2018   Anxiety    Arrhythmia 06/2016   Event monitor has shown PVCs, PSVT at 1 short burst of NSVT.;  Event monitor from March 2021: 6 short runs of wide-complex tachycardia and 25 runs of PSVT--intolerant of high-dose beta-blocker because of bradycardia.   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  (ANA positive, dsDNA negative)   Dysmenorrhea    Dysrhythmia    Elevated LFTs    Fatty liver    GERD (gastroesophageal reflux disease)    Glaucoma    History of COVID-19 02/19/2020   Hypothyroidism    IBS (irritable bowel syndrome)    Morbid obesity with BMI of 40.0-44.9, adult (HCC) 09/2020   BMI 43.07   MRSA infection 4-end-21   on abdomen   OSA on CPAP    Pulmonary hypertension (HCC)    PVC's (premature ventricular contractions)    Thyroid  disease    hypothyroidism   TIA  (transient ischemic attack)    2 per patient   Urinary incontinence    with sneezing, coughing    Past Surgical History   Past Surgical History:  Procedure Laterality Date   ATRIAL FIBRILLATION ABLATION  06/20/2024   Dr. Evalene [facility] => Pulmonary Vein Isolation   BIOPSY  11/01/2023   Procedure: BIOPSY;  Surgeon: Shaaron Lamar HERO, MD;  Location: [facility];  Service: Endoscopy;;   CARDIAC EVENT MONITOR  06/2016   [facility]) Noted PVCs, PSVT and NSVT (1 episode of 20 beats). ->  PT evaluation suggested PVCs appear to have been completely interpolated.  Felt to be septal   CARDIAC MRI -ADENOSINE / STRESS  05/28/2020   [facility]): EF 69%. Normal LV size, thickness and function w/ NO RWMA  CO & CI 6.5 L/min & 3 L/min.      Normal RV size  thickness and function.  No RV WMA or aneurysm.  No findings c/w  ARVC.  Both atria are mildly enlarged.  Trileaflet aortic valve with no evidence of stenosis.  Adenosine Stress Imaging: no  inducible myocardial ischemia OR prior infarction/scar or infiltrate. Normal AoV. Mid TR.   CARDIOVERSION N/A 09/14/2022   Procedure: CARDIOVERSION;  Surgeon: Jeffrie Oneil BROCKS, MD;  Location: [facility] ORS;  Service: Cardiovascular;  Laterality: N/A;   CARDIOVERSION N/A 05/02/2024   Procedure: CARDIOVERSION;  Surgeon: Stacia Diannah SQUIBB, MD;  Location: [facility] OR[facility];  Service: Cardiovascular;  Laterality: N/A;   CATARACT EXTRACTION Right 03/2018   CATARACT EXTRACTION Left 03/2018   CHOLECYSTECTOMY     ESOPHAGOGASTRODUODENOSCOPY (EGD) WITH PROPOFOL  N/A 11/01/2023   Procedure: ESOPHAGOGASTRODUODENOSCOPY (EGD) WITH PROPOFOL ;  Surgeon: Shaaron Lamar HERO, MD;  Location: [facility];  Service: Endoscopy;  Laterality: N/A;  1245PM, ASA 3   HYSTEROSCOPY WITH D & C N/A 04/29/2024   Procedure: DILATATION AND CURETTAGE LELDON NAIL;  Surgeon: Dallie Vera GAILS, MD;  Location: [facility] OR;  Service: Gynecology;  Laterality: N/A;  Myosure   LAPAROSCOPIC TUBAL LIGATION  1986   NUCLEAR STRESS  TEST  05/2014   EF 55%.  Mixed anteroseptal defect consistent with breast attenuation artifact.   TEE WITHOUT CARDIOVERSION N/A 09/14/2022   Procedure: TRANSESOPHAGEAL ECHOCARDIOGRAM (TEE);  Surgeon: Jeffrie Oneil BROCKS, MD;  Location: [facility];  Service: Cardiovascular;  Laterality: N/A;   TEE WITHOUT CARDIOVERSION N/A 05/02/2024   Procedure: ECHOCARDIOGRAM, TRANSESOPHAGEAL;  Surgeon: Stacia Diannah SQUIBB, MD;  Location: [facility];  Service: Cardiovascular;  Laterality: N/A;   TRANSTHORACIC ECHOCARDIOGRAM  08/2016   [facility]- Normal LV size and function-EF~55%.  No RWMA..  Normal LA pressures.  Normal RV function.  Trivial AR, PR and TR.  No stenoses.    TRANSTHORACIC ECHOCARDIOGRAM  02/14/2022   Difficult images.  Normal EF 60 to 65%.  No RWMA.  Normal diastolic parameters.  Normal RV size and function.  Mild to moderate LA dilation.  Normal aortic and mitral valves.  Acing aorta measured roughly 38 mm.   ZIO PATCH CARDIAC EVENT MONITOR  02/2020   Predom Rhytym: SR - min 46 - max 113 bpm, avg 64 bpm. Rare isolated PVC & PACs. 6 short NSVT runs (8-11 beats, 134-245 bpm); 25 runs of  PAT/SVT - fastest 9 beats (200 bpm), longest 10.6 sec (130 bpm). Noted Sx w/ PAT & NSVT during daylight hours, and only once at midnight.   ZIO PATCH MONITOR  01/2022   Predominant SR: Rate range 46-99 bpm, avg 61 bpm.  Rare PACs & PVCs (& rare couplets).  No bigeminy/trigeminy.  (Sx noted w/ PACs and PVCs - & a few Atrial Runs).  35 Atrial Runs + 1 V. tach run of 4 Narrow Complex beats; fastest atrial run 5 beats @ 193 bpm & longest 22 beats (12.1 sec) @ avg 108 bpm.    Past Family History   Family History  Problem Relation Age of Onset   Heart attack Father        dec age 48   Hypertension Father    Hypertension Mother    Hyperlipidemia Mother    Migraines Mother    Seizures Mother        epilepsy   Stroke Mother        TIA's   Thyroid  disease Sister        hypothyroid   Thyroid  disease Sister        hypothyroid    Rheum  arthritis Maternal Grandmother    Migraines Maternal Grandmother    Cancer Maternal Grandfather 80       colon ca--DEC age 83   Diabetes Maternal Grandfather    Stroke Paternal Grandmother        multiple   Thyroid  disease Paternal Grandmother        goiter-hypothyroid   Breast cancer Maternal Aunt     Past Social History   Social History   Socioeconomic History   Marital status: Married    Spouse name: Not on file   Number of children: Not on file   Years of education: Not on file   Highest education level: Not on file  Occupational History   Not on file  Tobacco Use   Smoking status: Never   Smokeless tobacco: Never   Tobacco comments:    Never smoke 11/28/22  Vaping Use   Vaping status: Never Used  Substance and Sexual Activity   Alcohol use: No    Alcohol/week: 0.0 standard drinks of alcohol   Drug use: No   Sexual activity: Not Currently    Partners: Male    Birth control/protection: Post-menopausal    Comment: less than 5, after IC, no hx of STD, no abnormal pap, no DES  Other Topics Concern   Not on file  Social History Narrative   She lives in [place], [place]   Right handed    Caffeine use: 1 cup coffee every morning   Social Drivers of Health   Tobacco Use: Low Risk (01/29/2025)   Patient History    Smoking Tobacco Use: Never    Smokeless Tobacco Use: Never    Passive Exposure: Not on file  Financial Resource Strain: Not on file  Food Insecurity: No Food Insecurity (05/01/2024)   Hunger Vital Sign    Worried About Running Out of Food in the Last Year: Never true    Ran Out of Food in the Last Year: Never true  Transportation Needs: No Transportation Needs (05/01/2024)   PRAPARE - Administrator, Civil Service (Medical): No    Lack of Transportation (Non-Medical): No  Physical Activity: Not on file  Stress: Not on file  Social Connections: Socially Integrated (05/01/2024)   Social Connection and Isolation Panel    Frequency of  Communication with Friends and Family: More than three times a week    Frequency of Social Gatherings with Friends and Family: More than three times a week    Attends Religious Services: More than 4 times per year    Active Member of [employer] or Organizations: Yes    Attends Engineer, Structural: More than 4 times per year    Marital Status: Married  Catering Manager Violence: Not At Risk (05/01/2024)   Humiliation, Afraid, Rape, and Kick questionnaire    Fear of Current or Ex-Partner: No    Emotionally Abused: No    Physically Abused: No    Sexually Abused: No  Depression (PHQ2-9): Not on file  Alcohol Screen: Not on file  Housing: Low Risk (05/01/2024)   Housing Stability Vital Sign    Unable to Pay for Housing in the Last Year: No    Number of Times Moved in the Last Year: 0    Homeless in the Last Year: No  Utilities: Not At Risk (05/01/2024)   [facility] Utilities    Threatened with loss of utilities: No  Health Literacy: Not on file    Review of Systems   General: Negative for  anorexia, weight loss, fever, chills, fatigue, weakness. ENT: Negative for hoarseness, difficulty swallowing , nasal congestion. CV: Negative for chest pain, angina, palpitations, dyspnea on exertion, peripheral edema.  Respiratory: Negative for dyspnea at rest, dyspnea on exertion, cough, sputum, wheezing.  GI: See history of present illness. GU:  Negative for dysuria, hematuria, urinary incontinence, urinary frequency, nocturnal urination.  Endo: Negative for unusual weight change.     Physical Exam   BP 134/71   Pulse 62   Temp 98.2 F (36.8 C) (Oral)   Ht 5' 5 (1.651 m)   Wt 261 lb 9.6 oz (118.7 kg)   LMP 04/16/2016   SpO2 95%   BMI 43.53 kg/m    General: Well-nourished, well-developed in no acute distress.  Eyes: No icterus. Mouth: Oropharyngeal mucosa moist and pink   Lungs: Clear to auscultation bilaterally.  Heart: Regular rate and rhythm, no murmurs rubs or gallops.  Abdomen: Bowel  sounds are normal, nontender, nondistended, no hepatosplenomegaly or masses,  no abdominal bruits or hernia , no rebound or guarding.  Rectal: not performed Extremities: No lower extremity edema. No clubbing or deformities. Neuro: Alert and oriented x 4   Skin: Warm and dry, no jaundice.   Psych: Alert and cooperative, normal mood and affect.  Labs   See above  Imaging Studies   No results found.  Assessment/Plan:    Assessment & Plan Celiac disease Well-controlled and asymptomatic on a strict gluten-free diet.  She has been check for various vitamin deficiencies in the past and chronically on supplements. Will update labs in July. - continue gluten free diet - update labs in July including TTG IgA, vit D, vit b12, b1, folate, iron/tibc/ferritin, zinc , selenium, magnesium , INR -add calcium 600mg , vit D continue 400IU daily -add daily zinc  -reduce folic acid  to 1mg  -return ov in July    Metabolic dysfunction-associated steatotic liver disease (MASLD) FIB 4 of 1.05, advanced fibrosis excluded  - Recommended continued lifestyle modifications: low fat, low carbohydrate diet, alcohol avoidance, and management of comorbidities. - Update labs, cbc, cmet, in July with return ov   Erosive reflux esophagitis Well-controlled and asymptomatic off acid suppression therapy with adherence to gluten-free diet. - No current need for acid suppression therapy; continue gluten-free diet.  Colon cancer screening -colonoscopy due this year, she wants to hold off until July, husband having knee replacement surgery soon         Sonny RAMAN. Ezzard, MHS, PA-C [facility]  "

## 2025-02-17 ENCOUNTER — Ambulatory Visit: Admitting: Student

## 2025-10-23 ENCOUNTER — Encounter: Admitting: Obstetrics and Gynecology
